# Patient Record
Sex: Male | Born: 1955
Health system: Southern US, Community
[De-identification: ages and names within clinical notes are randomized; demographics above are authoritative.]

## PROBLEM LIST (undated history)

## (undated) DIAGNOSIS — K219 Gastro-esophageal reflux disease without esophagitis: Secondary | ICD-10-CM

## (undated) DIAGNOSIS — N181 Chronic kidney disease, stage 1: Secondary | ICD-10-CM

## (undated) DIAGNOSIS — Z8673 Personal history of transient ischemic attack (TIA), and cerebral infarction without residual deficits: Secondary | ICD-10-CM

## (undated) DIAGNOSIS — I129 Hypertensive chronic kidney disease with stage 1 through stage 4 chronic kidney disease, or unspecified chronic kidney disease: Secondary | ICD-10-CM

## (undated) DIAGNOSIS — Z8619 Personal history of other infectious and parasitic diseases: Secondary | ICD-10-CM

## (undated) DIAGNOSIS — M1990 Unspecified osteoarthritis, unspecified site: Secondary | ICD-10-CM

## (undated) DIAGNOSIS — E669 Obesity, unspecified: Secondary | ICD-10-CM

## (undated) DIAGNOSIS — I10 Essential (primary) hypertension: Secondary | ICD-10-CM

## (undated) DIAGNOSIS — E785 Hyperlipidemia, unspecified: Secondary | ICD-10-CM

## (undated) DIAGNOSIS — R079 Chest pain, unspecified: Secondary | ICD-10-CM

## (undated) DIAGNOSIS — N1831 Chronic kidney disease, stage 3a: Secondary | ICD-10-CM

## (undated) DIAGNOSIS — M5416 Radiculopathy, lumbar region: Secondary | ICD-10-CM

## (undated) DIAGNOSIS — R7301 Impaired fasting glucose: Secondary | ICD-10-CM

## (undated) HISTORY — DX: Radiculopathy, lumbar region: M54.16

## (undated) HISTORY — DX: Essential (primary) hypertension: I10

## (undated) HISTORY — DX: Hyperlipidemia, unspecified: E78.5

## (undated) HISTORY — DX: Chronic kidney disease, stage 1: N18.1

## (undated) HISTORY — DX: Personal history of transient ischemic attack (TIA), and cerebral infarction without residual deficits: Z86.73

## (undated) HISTORY — DX: Obesity, unspecified: E66.9

## (undated) HISTORY — DX: Unspecified osteoarthritis, unspecified site: M19.90

## (undated) HISTORY — DX: Personal history of other infectious and parasitic diseases: Z86.19

## (undated) HISTORY — DX: Chest pain, unspecified: R07.9

## (undated) HISTORY — DX: Gastro-esophageal reflux disease without esophagitis: K21.9

## (undated) HISTORY — DX: Chronic kidney disease, stage 3a: N18.31

## (undated) HISTORY — DX: Hypertensive chronic kidney disease with stage 1 through stage 4 chronic kidney disease, or unspecified chronic kidney disease: I12.9

## (undated) HISTORY — DX: Impaired fasting glucose: R73.01

---

## 1998-08-13 HISTORY — PX: MOUTH SURGERY: SHX715

## 2018-10-02 DIAGNOSIS — E7849 Other hyperlipidemia: Secondary | ICD-10-CM | POA: Diagnosis not present

## 2018-10-02 DIAGNOSIS — I10 Essential (primary) hypertension: Secondary | ICD-10-CM | POA: Diagnosis not present

## 2018-10-02 DIAGNOSIS — Z125 Encounter for screening for malignant neoplasm of prostate: Secondary | ICD-10-CM | POA: Diagnosis not present

## 2018-10-10 DIAGNOSIS — I10 Essential (primary) hypertension: Secondary | ICD-10-CM | POA: Diagnosis not present

## 2018-10-10 DIAGNOSIS — E7849 Other hyperlipidemia: Secondary | ICD-10-CM | POA: Diagnosis not present

## 2018-10-10 DIAGNOSIS — Z0000 Encounter for general adult medical examination without abnormal findings: Secondary | ICD-10-CM | POA: Diagnosis not present

## 2018-10-10 DIAGNOSIS — Z6839 Body mass index (BMI) 39.0-39.9, adult: Secondary | ICD-10-CM | POA: Diagnosis not present

## 2018-10-10 DIAGNOSIS — E669 Obesity, unspecified: Secondary | ICD-10-CM | POA: Diagnosis not present

## 2018-10-10 DIAGNOSIS — M5416 Radiculopathy, lumbar region: Secondary | ICD-10-CM | POA: Diagnosis not present

## 2018-10-13 DIAGNOSIS — Z1212 Encounter for screening for malignant neoplasm of rectum: Secondary | ICD-10-CM | POA: Diagnosis not present

## 2018-10-20 DIAGNOSIS — M545 Low back pain: Secondary | ICD-10-CM | POA: Diagnosis not present

## 2018-10-20 DIAGNOSIS — M5417 Radiculopathy, lumbosacral region: Secondary | ICD-10-CM | POA: Diagnosis not present

## 2018-10-28 ENCOUNTER — Other Ambulatory Visit: Payer: Self-pay

## 2018-10-28 ENCOUNTER — Ambulatory Visit: Payer: Medicare Other | Attending: Orthopedic Surgery | Admitting: Physical Therapy

## 2018-10-28 DIAGNOSIS — M5416 Radiculopathy, lumbar region: Secondary | ICD-10-CM | POA: Diagnosis not present

## 2018-10-28 NOTE — Patient Instructions (Signed)
On Elbows (Prone)    Rise up on elbows as high as possible, keeping hips on floor. Hold __30-60__ seconds. Repeat _3-5___ times per set. Do ____ sets per session. Do multiple sessions per day.   IF THE NUMBNESS AND PAIN GETS WORSE WITH THIS. STOP. IF THE NUMBNESS AND PAIN IN THE LEG FEEL BETTER, BUT YOU'RE BACK HURTS A LITTLE WORSE, THAT IS OKAY.  THE GOAL IS TO GET ALL LEG SYMPTOMS TO GO AWAY.  http://orth.exer.us/92   Copyright  VHI. All rights reserved.

## 2018-10-28 NOTE — Therapy (Signed)
[facility] [place] [address] [place], [place], [address] Phone: [phone]   Fax:  [phone]  Physical Therapy Evaluation  Patient Details  Name: Joseph Compton MRN: [id number] Date of Birth: May 21, 1956 Referring Provider (PT): Lacie Draft PAC/Beane   Encounter Date: 10/28/2018  PT End of Session - 10/28/18 9767    Visit Number  1    Number of Visits  12    Date for PT Re-Evaluation  11/25/18    PT Start Time  0922    PT Stop Time  1021    PT Time Calculation (min)  59 min    Activity Tolerance  Patient limited by pain    Behavior During Therapy  [facility] for tasks assessed/performed       Past Medical History:  Diagnosis Date  . Arthritis   . Hypertension     History reviewed. No pertinent surgical history.  There were no vitals filed for this visit.   Subjective Assessment - 10/28/18 0923    Subjective  In December of 2019 pt began getting some pain and numbness in his left low back and leg. 2 weeks ago Sunday he awoke and couldn't move with pain in same. When he gets up out of bed he feels pain and numbness. Walking around makes him feel better.     Pertinent History  HTN, neck pain, arthritis, right knee pain    Diagnostic tests  MRI 10/30/18; xrays some curvature    Currently in Pain?  Yes    Pain Score  5     Pain Location  Back    Pain Orientation  Left    Pain Descriptors / Indicators  Shooting;Numbness    Pain Type  Acute pain    Pain Radiating Towards  down leg to mid foot    Pain Onset  More than a month ago    Pain Frequency  Constant    Aggravating Factors   awaking from sleeping    Pain Relieving Factors  walking         [facility] PT Assessment - 10/28/18 0001      Assessment   Medical Diagnosis  lumbar radiculities    Referring Provider (PT)  Lacie Draft PAC/Beane    Onset Date/Surgical Date  07/13/18    Next MD Visit  11/03/18      Precautions   Precautions  None      Restrictions    Weight Bearing Restrictions  No      Balance Screen   Has the patient fallen in the past 6 months  No    Has the patient had a decrease in activity level because of a fear of falling?   No    Is the patient reluctant to leave their home because of a fear of falling?   No      Home Environment   Living Environment  Private residence    Living Arrangements  Spouse/significant other    Available Help at Discharge  Family    Additional Comments  one step no rail      Prior Function   Level of [place]  Retired      Mining engineer Comments  stands in left SB and slight flexion; rounded shoulders, forward head      ROM / Strength   AROM / PROM / Strength  AROM;Strength      AROM   Overall AROM  Comments  Lumbar bend hands to knees, no extension, 75% decrease in SB with pain in all motions      Strength   Overall Strength Comments  Bil hip flex 4-/5, ext 4/5, ABD unable to test, knee ext 4/5, flex bil 4+/5; left ankle grossly 4-/5, but unsure if patient able to fully perform MMT due to pain      Palpation   Palpation comment  left gluteals marked tenderness      Special Tests   Other special tests  positive SLR and slump test on Left; assessed positioning prone over 2 pillows, POE, press ups. POE less pain; numbnes persists                Objective measurements completed on examination: See above findings.      [facility] Adult PT Treatment/Exercise - 10/28/18 0001      Modalities   Modalities  Electrical Stimulation;Moist Heat      Moist Heat Therapy   Number Minutes Moist Heat  15 Minutes    Moist Heat Location  Lumbar Spine      Electrical Stimulation   Electrical Stimulation Location  left lumbar/gluteals    Electrical Stimulation Action  IFC    Electrical Stimulation Parameters  prone    Electrical Stimulation Goals  Pain               PT Short Term Goals - 10/28/18 1208      PT SHORT TERM GOAL #1    Title  Ind with initial HEP    Time  2    Period  Weeks    Status  New    Target Date  11/11/18      PT SHORT TERM GOAL #2   Title  Patient to report decreased pain by 50% overall to improve function.    Time  2    Period  Weeks    Status  New        PT Long Term Goals - 10/28/18 1209      PT LONG TERM GOAL #1   Title  Patient to report no radicular sx in the LLE to normalize gait    Time  4    Status  New    Target Date  11/25/18      PT LONG TERM GOAL #2   Title  Decreased pain with ADLS to 4/17 or less in the back.    Time  4    Period  Weeks    Status  New      PT LONG TERM GOAL #3   Title  Demo BLE strength of 4+/5  in order to utilize proper body mechanics.    Time  4    Period  Weeks    Status  New      PT LONG TERM GOAL #4   Title  Patient able to demonstrate correct body mechanics with lifting and verbalize ADLS modifications to prevent further injury to back.    Time  4    Period  Weeks    Status  New             Plan - 10/28/18 1009    Clinical Impression Statement  Patient presents with c/o lumbar radiculopathy starting in Dec 2019 and worsening in early March after lifting and moving boxes. He has pain and numbness in his left low back, hip and down his LLE to mid foot. He has pain with standing and walking after sitting  and especially after sleeping. MRI scheduled for 10/30/18. He stands with decreased WB on LLE and hip in ER. He also laterally SB to the left. He has significant strength deficits, but it is unclear whether this is truly due to weakness or due to amount of pain pt was in during evaluation. He will benefit from PT to decrease pain, restore lumbar ROM and increase strength in order to return to pain free ADLS.    Personal Factors and Comorbidities  Comorbidity 3+    Comorbidities  arthritis, HTN, neck pain, right knee pain    Examination-Activity Limitations  Lift;Bend;Locomotion Level;Stand    Examination-Participation Restrictions   Other   all ADLS limited at this time   Stability/Clinical Decision Making  Evolving/Moderate complexity    Clinical Decision Making  Moderate    Rehab Potential  Good    PT Frequency  3x / week    PT Duration  4 weeks    PT Treatment/Interventions  ADLs/Self Care Home Management;Cryotherapy;Electrical Stimulation;Moist Heat;Traction;Ultrasound;Therapeutic activities;Therapeutic exercise;Neuromuscular re-education;Patient/family education;Manual techniques;Dry needling    PT Next Visit Plan  work on centralizing pain; correct postural deviation; try traction    PT Home Exercise Plan  prone on elbows as tolerated to try to centralize sx    Consulted and Agree with Plan of Care  Patient       Patient will benefit from skilled therapeutic intervention in order to improve the following deficits and impairments:  Abnormal gait, Pain, Improper body mechanics, Decreased mobility, Increased muscle spasms, Postural dysfunction, Decreased range of motion, Decreased strength  Visit Diagnosis: Radiculopathy, lumbar region - Plan: PT plan of care cert/re-cert     Problem List There are no active problems to display for this patient.   Madelyn Flavors PT 10/28/2018, 12:19 PM  [address] [place], [place], [address] Phone: [phone]   Fax:  [phone]  Name: Harden Bramer MRN: [id number] Date of Birth: 1956-06-22

## 2018-11-05 ENCOUNTER — Encounter: Payer: Medicare Other | Admitting: Physical Therapy

## 2018-11-06 DIAGNOSIS — M545 Low back pain: Secondary | ICD-10-CM | POA: Diagnosis not present

## 2018-11-06 DIAGNOSIS — M5116 Intervertebral disc disorders with radiculopathy, lumbar region: Secondary | ICD-10-CM | POA: Insufficient documentation

## 2018-11-07 ENCOUNTER — Encounter: Payer: Medicare Other | Admitting: Physical Therapy

## 2018-11-17 ENCOUNTER — Ambulatory Visit: Payer: Medicare Other | Admitting: Physical Therapy

## 2018-11-17 DIAGNOSIS — M5136 Other intervertebral disc degeneration, lumbar region: Secondary | ICD-10-CM | POA: Diagnosis not present

## 2018-11-17 DIAGNOSIS — E882 Lipomatosis, not elsewhere classified: Secondary | ICD-10-CM | POA: Diagnosis not present

## 2018-11-17 DIAGNOSIS — D1779 Benign lipomatous neoplasm of other sites: Secondary | ICD-10-CM | POA: Insufficient documentation

## 2018-11-17 DIAGNOSIS — M5126 Other intervertebral disc displacement, lumbar region: Secondary | ICD-10-CM | POA: Diagnosis not present

## 2018-11-18 DIAGNOSIS — M5416 Radiculopathy, lumbar region: Secondary | ICD-10-CM | POA: Diagnosis not present

## 2018-11-18 DIAGNOSIS — M5126 Other intervertebral disc displacement, lumbar region: Secondary | ICD-10-CM | POA: Diagnosis not present

## 2018-11-18 DIAGNOSIS — M5136 Other intervertebral disc degeneration, lumbar region: Secondary | ICD-10-CM | POA: Diagnosis not present

## 2018-12-02 DIAGNOSIS — M5136 Other intervertebral disc degeneration, lumbar region: Secondary | ICD-10-CM | POA: Diagnosis not present

## 2018-12-02 DIAGNOSIS — M9903 Segmental and somatic dysfunction of lumbar region: Secondary | ICD-10-CM | POA: Diagnosis not present

## 2018-12-02 DIAGNOSIS — M9904 Segmental and somatic dysfunction of sacral region: Secondary | ICD-10-CM | POA: Diagnosis not present

## 2018-12-02 DIAGNOSIS — M9905 Segmental and somatic dysfunction of pelvic region: Secondary | ICD-10-CM | POA: Diagnosis not present

## 2018-12-03 DIAGNOSIS — M9903 Segmental and somatic dysfunction of lumbar region: Secondary | ICD-10-CM | POA: Diagnosis not present

## 2018-12-03 DIAGNOSIS — M5126 Other intervertebral disc displacement, lumbar region: Secondary | ICD-10-CM | POA: Diagnosis not present

## 2018-12-03 DIAGNOSIS — M5430 Sciatica, unspecified side: Secondary | ICD-10-CM | POA: Diagnosis not present

## 2018-12-03 DIAGNOSIS — M5136 Other intervertebral disc degeneration, lumbar region: Secondary | ICD-10-CM | POA: Diagnosis not present

## 2018-12-03 DIAGNOSIS — M9904 Segmental and somatic dysfunction of sacral region: Secondary | ICD-10-CM | POA: Diagnosis not present

## 2018-12-03 DIAGNOSIS — M9905 Segmental and somatic dysfunction of pelvic region: Secondary | ICD-10-CM | POA: Diagnosis not present

## 2018-12-03 DIAGNOSIS — E882 Lipomatosis, not elsewhere classified: Secondary | ICD-10-CM | POA: Diagnosis not present

## 2018-12-04 DIAGNOSIS — M9905 Segmental and somatic dysfunction of pelvic region: Secondary | ICD-10-CM | POA: Diagnosis not present

## 2018-12-04 DIAGNOSIS — M9904 Segmental and somatic dysfunction of sacral region: Secondary | ICD-10-CM | POA: Diagnosis not present

## 2018-12-04 DIAGNOSIS — M5126 Other intervertebral disc displacement, lumbar region: Secondary | ICD-10-CM | POA: Diagnosis not present

## 2018-12-04 DIAGNOSIS — M5136 Other intervertebral disc degeneration, lumbar region: Secondary | ICD-10-CM | POA: Diagnosis not present

## 2018-12-04 DIAGNOSIS — M9903 Segmental and somatic dysfunction of lumbar region: Secondary | ICD-10-CM | POA: Diagnosis not present

## 2018-12-08 DIAGNOSIS — M5136 Other intervertebral disc degeneration, lumbar region: Secondary | ICD-10-CM | POA: Diagnosis not present

## 2018-12-08 DIAGNOSIS — M9903 Segmental and somatic dysfunction of lumbar region: Secondary | ICD-10-CM | POA: Diagnosis not present

## 2018-12-08 DIAGNOSIS — M9905 Segmental and somatic dysfunction of pelvic region: Secondary | ICD-10-CM | POA: Diagnosis not present

## 2018-12-08 DIAGNOSIS — M9904 Segmental and somatic dysfunction of sacral region: Secondary | ICD-10-CM | POA: Diagnosis not present

## 2018-12-10 DIAGNOSIS — M9904 Segmental and somatic dysfunction of sacral region: Secondary | ICD-10-CM | POA: Diagnosis not present

## 2018-12-10 DIAGNOSIS — M9903 Segmental and somatic dysfunction of lumbar region: Secondary | ICD-10-CM | POA: Diagnosis not present

## 2018-12-10 DIAGNOSIS — M9905 Segmental and somatic dysfunction of pelvic region: Secondary | ICD-10-CM | POA: Diagnosis not present

## 2018-12-10 DIAGNOSIS — M5136 Other intervertebral disc degeneration, lumbar region: Secondary | ICD-10-CM | POA: Diagnosis not present

## 2018-12-15 DIAGNOSIS — M5136 Other intervertebral disc degeneration, lumbar region: Secondary | ICD-10-CM | POA: Diagnosis not present

## 2018-12-15 DIAGNOSIS — M9905 Segmental and somatic dysfunction of pelvic region: Secondary | ICD-10-CM | POA: Diagnosis not present

## 2018-12-15 DIAGNOSIS — M9903 Segmental and somatic dysfunction of lumbar region: Secondary | ICD-10-CM | POA: Diagnosis not present

## 2018-12-15 DIAGNOSIS — M9904 Segmental and somatic dysfunction of sacral region: Secondary | ICD-10-CM | POA: Diagnosis not present

## 2018-12-16 DIAGNOSIS — M9904 Segmental and somatic dysfunction of sacral region: Secondary | ICD-10-CM | POA: Diagnosis not present

## 2018-12-16 DIAGNOSIS — M9905 Segmental and somatic dysfunction of pelvic region: Secondary | ICD-10-CM | POA: Diagnosis not present

## 2018-12-16 DIAGNOSIS — M5136 Other intervertebral disc degeneration, lumbar region: Secondary | ICD-10-CM | POA: Diagnosis not present

## 2018-12-16 DIAGNOSIS — M9903 Segmental and somatic dysfunction of lumbar region: Secondary | ICD-10-CM | POA: Diagnosis not present

## 2018-12-18 DIAGNOSIS — M9903 Segmental and somatic dysfunction of lumbar region: Secondary | ICD-10-CM | POA: Diagnosis not present

## 2018-12-18 DIAGNOSIS — M9905 Segmental and somatic dysfunction of pelvic region: Secondary | ICD-10-CM | POA: Diagnosis not present

## 2018-12-18 DIAGNOSIS — M5136 Other intervertebral disc degeneration, lumbar region: Secondary | ICD-10-CM | POA: Diagnosis not present

## 2018-12-18 DIAGNOSIS — M9904 Segmental and somatic dysfunction of sacral region: Secondary | ICD-10-CM | POA: Diagnosis not present

## 2018-12-22 DIAGNOSIS — M5136 Other intervertebral disc degeneration, lumbar region: Secondary | ICD-10-CM | POA: Diagnosis not present

## 2018-12-22 DIAGNOSIS — M9905 Segmental and somatic dysfunction of pelvic region: Secondary | ICD-10-CM | POA: Diagnosis not present

## 2018-12-22 DIAGNOSIS — M9904 Segmental and somatic dysfunction of sacral region: Secondary | ICD-10-CM | POA: Diagnosis not present

## 2018-12-22 DIAGNOSIS — M9903 Segmental and somatic dysfunction of lumbar region: Secondary | ICD-10-CM | POA: Diagnosis not present

## 2018-12-25 DIAGNOSIS — M5136 Other intervertebral disc degeneration, lumbar region: Secondary | ICD-10-CM | POA: Diagnosis not present

## 2018-12-25 DIAGNOSIS — M9904 Segmental and somatic dysfunction of sacral region: Secondary | ICD-10-CM | POA: Diagnosis not present

## 2018-12-25 DIAGNOSIS — M9905 Segmental and somatic dysfunction of pelvic region: Secondary | ICD-10-CM | POA: Diagnosis not present

## 2018-12-25 DIAGNOSIS — M9903 Segmental and somatic dysfunction of lumbar region: Secondary | ICD-10-CM | POA: Diagnosis not present

## 2018-12-30 DIAGNOSIS — M9904 Segmental and somatic dysfunction of sacral region: Secondary | ICD-10-CM | POA: Diagnosis not present

## 2018-12-30 DIAGNOSIS — M9905 Segmental and somatic dysfunction of pelvic region: Secondary | ICD-10-CM | POA: Diagnosis not present

## 2018-12-30 DIAGNOSIS — M5136 Other intervertebral disc degeneration, lumbar region: Secondary | ICD-10-CM | POA: Diagnosis not present

## 2018-12-30 DIAGNOSIS — M9903 Segmental and somatic dysfunction of lumbar region: Secondary | ICD-10-CM | POA: Diagnosis not present

## 2019-01-06 DIAGNOSIS — M9905 Segmental and somatic dysfunction of pelvic region: Secondary | ICD-10-CM | POA: Diagnosis not present

## 2019-01-06 DIAGNOSIS — M5136 Other intervertebral disc degeneration, lumbar region: Secondary | ICD-10-CM | POA: Diagnosis not present

## 2019-01-06 DIAGNOSIS — M9904 Segmental and somatic dysfunction of sacral region: Secondary | ICD-10-CM | POA: Diagnosis not present

## 2019-01-06 DIAGNOSIS — M9903 Segmental and somatic dysfunction of lumbar region: Secondary | ICD-10-CM | POA: Diagnosis not present

## 2019-01-08 DIAGNOSIS — M9904 Segmental and somatic dysfunction of sacral region: Secondary | ICD-10-CM | POA: Diagnosis not present

## 2019-01-08 DIAGNOSIS — M9905 Segmental and somatic dysfunction of pelvic region: Secondary | ICD-10-CM | POA: Diagnosis not present

## 2019-01-08 DIAGNOSIS — M5136 Other intervertebral disc degeneration, lumbar region: Secondary | ICD-10-CM | POA: Diagnosis not present

## 2019-01-08 DIAGNOSIS — M9903 Segmental and somatic dysfunction of lumbar region: Secondary | ICD-10-CM | POA: Diagnosis not present

## 2019-01-12 DIAGNOSIS — M9905 Segmental and somatic dysfunction of pelvic region: Secondary | ICD-10-CM | POA: Diagnosis not present

## 2019-01-12 DIAGNOSIS — M9903 Segmental and somatic dysfunction of lumbar region: Secondary | ICD-10-CM | POA: Diagnosis not present

## 2019-01-12 DIAGNOSIS — M5136 Other intervertebral disc degeneration, lumbar region: Secondary | ICD-10-CM | POA: Diagnosis not present

## 2019-01-12 DIAGNOSIS — M9904 Segmental and somatic dysfunction of sacral region: Secondary | ICD-10-CM | POA: Diagnosis not present

## 2019-01-14 DIAGNOSIS — M9905 Segmental and somatic dysfunction of pelvic region: Secondary | ICD-10-CM | POA: Diagnosis not present

## 2019-01-14 DIAGNOSIS — M9903 Segmental and somatic dysfunction of lumbar region: Secondary | ICD-10-CM | POA: Diagnosis not present

## 2019-01-14 DIAGNOSIS — M5136 Other intervertebral disc degeneration, lumbar region: Secondary | ICD-10-CM | POA: Diagnosis not present

## 2019-01-14 DIAGNOSIS — M9904 Segmental and somatic dysfunction of sacral region: Secondary | ICD-10-CM | POA: Diagnosis not present

## 2019-01-15 DIAGNOSIS — M9903 Segmental and somatic dysfunction of lumbar region: Secondary | ICD-10-CM | POA: Diagnosis not present

## 2019-01-15 DIAGNOSIS — M5136 Other intervertebral disc degeneration, lumbar region: Secondary | ICD-10-CM | POA: Diagnosis not present

## 2019-01-15 DIAGNOSIS — M9904 Segmental and somatic dysfunction of sacral region: Secondary | ICD-10-CM | POA: Diagnosis not present

## 2019-01-15 DIAGNOSIS — H16223 Keratoconjunctivitis sicca, not specified as Sjogren's, bilateral: Secondary | ICD-10-CM | POA: Diagnosis not present

## 2019-01-15 DIAGNOSIS — M9905 Segmental and somatic dysfunction of pelvic region: Secondary | ICD-10-CM | POA: Diagnosis not present

## 2019-01-19 DIAGNOSIS — M9903 Segmental and somatic dysfunction of lumbar region: Secondary | ICD-10-CM | POA: Diagnosis not present

## 2019-01-19 DIAGNOSIS — M5136 Other intervertebral disc degeneration, lumbar region: Secondary | ICD-10-CM | POA: Diagnosis not present

## 2019-01-19 DIAGNOSIS — M9904 Segmental and somatic dysfunction of sacral region: Secondary | ICD-10-CM | POA: Diagnosis not present

## 2019-01-19 DIAGNOSIS — M9905 Segmental and somatic dysfunction of pelvic region: Secondary | ICD-10-CM | POA: Diagnosis not present

## 2019-01-21 DIAGNOSIS — M9904 Segmental and somatic dysfunction of sacral region: Secondary | ICD-10-CM | POA: Diagnosis not present

## 2019-01-21 DIAGNOSIS — M9905 Segmental and somatic dysfunction of pelvic region: Secondary | ICD-10-CM | POA: Diagnosis not present

## 2019-01-21 DIAGNOSIS — M9903 Segmental and somatic dysfunction of lumbar region: Secondary | ICD-10-CM | POA: Diagnosis not present

## 2019-01-21 DIAGNOSIS — M5136 Other intervertebral disc degeneration, lumbar region: Secondary | ICD-10-CM | POA: Diagnosis not present

## 2019-01-26 DIAGNOSIS — M5136 Other intervertebral disc degeneration, lumbar region: Secondary | ICD-10-CM | POA: Diagnosis not present

## 2019-01-26 DIAGNOSIS — M9904 Segmental and somatic dysfunction of sacral region: Secondary | ICD-10-CM | POA: Diagnosis not present

## 2019-01-26 DIAGNOSIS — M9905 Segmental and somatic dysfunction of pelvic region: Secondary | ICD-10-CM | POA: Diagnosis not present

## 2019-01-26 DIAGNOSIS — M9903 Segmental and somatic dysfunction of lumbar region: Secondary | ICD-10-CM | POA: Diagnosis not present

## 2019-01-28 DIAGNOSIS — M9904 Segmental and somatic dysfunction of sacral region: Secondary | ICD-10-CM | POA: Diagnosis not present

## 2019-01-28 DIAGNOSIS — M9905 Segmental and somatic dysfunction of pelvic region: Secondary | ICD-10-CM | POA: Diagnosis not present

## 2019-01-28 DIAGNOSIS — M5136 Other intervertebral disc degeneration, lumbar region: Secondary | ICD-10-CM | POA: Diagnosis not present

## 2019-01-28 DIAGNOSIS — M9903 Segmental and somatic dysfunction of lumbar region: Secondary | ICD-10-CM | POA: Diagnosis not present

## 2019-01-29 DIAGNOSIS — M5136 Other intervertebral disc degeneration, lumbar region: Secondary | ICD-10-CM | POA: Diagnosis not present

## 2019-01-29 DIAGNOSIS — M9903 Segmental and somatic dysfunction of lumbar region: Secondary | ICD-10-CM | POA: Diagnosis not present

## 2019-01-29 DIAGNOSIS — M9904 Segmental and somatic dysfunction of sacral region: Secondary | ICD-10-CM | POA: Diagnosis not present

## 2019-01-29 DIAGNOSIS — M9905 Segmental and somatic dysfunction of pelvic region: Secondary | ICD-10-CM | POA: Diagnosis not present

## 2019-02-02 DIAGNOSIS — M9903 Segmental and somatic dysfunction of lumbar region: Secondary | ICD-10-CM | POA: Diagnosis not present

## 2019-02-02 DIAGNOSIS — M9905 Segmental and somatic dysfunction of pelvic region: Secondary | ICD-10-CM | POA: Diagnosis not present

## 2019-02-02 DIAGNOSIS — M9904 Segmental and somatic dysfunction of sacral region: Secondary | ICD-10-CM | POA: Diagnosis not present

## 2019-02-02 DIAGNOSIS — M5136 Other intervertebral disc degeneration, lumbar region: Secondary | ICD-10-CM | POA: Diagnosis not present

## 2019-02-03 DIAGNOSIS — M5136 Other intervertebral disc degeneration, lumbar region: Secondary | ICD-10-CM | POA: Diagnosis not present

## 2019-02-03 DIAGNOSIS — M9903 Segmental and somatic dysfunction of lumbar region: Secondary | ICD-10-CM | POA: Diagnosis not present

## 2019-02-03 DIAGNOSIS — M9904 Segmental and somatic dysfunction of sacral region: Secondary | ICD-10-CM | POA: Diagnosis not present

## 2019-02-03 DIAGNOSIS — M9905 Segmental and somatic dysfunction of pelvic region: Secondary | ICD-10-CM | POA: Diagnosis not present

## 2019-02-05 DIAGNOSIS — M9904 Segmental and somatic dysfunction of sacral region: Secondary | ICD-10-CM | POA: Diagnosis not present

## 2019-02-05 DIAGNOSIS — M9903 Segmental and somatic dysfunction of lumbar region: Secondary | ICD-10-CM | POA: Diagnosis not present

## 2019-02-05 DIAGNOSIS — M5136 Other intervertebral disc degeneration, lumbar region: Secondary | ICD-10-CM | POA: Diagnosis not present

## 2019-02-05 DIAGNOSIS — M9905 Segmental and somatic dysfunction of pelvic region: Secondary | ICD-10-CM | POA: Diagnosis not present

## 2019-02-09 DIAGNOSIS — M5136 Other intervertebral disc degeneration, lumbar region: Secondary | ICD-10-CM | POA: Diagnosis not present

## 2019-02-09 DIAGNOSIS — M9903 Segmental and somatic dysfunction of lumbar region: Secondary | ICD-10-CM | POA: Diagnosis not present

## 2019-02-09 DIAGNOSIS — M9904 Segmental and somatic dysfunction of sacral region: Secondary | ICD-10-CM | POA: Diagnosis not present

## 2019-02-09 DIAGNOSIS — M9905 Segmental and somatic dysfunction of pelvic region: Secondary | ICD-10-CM | POA: Diagnosis not present

## 2019-02-10 DIAGNOSIS — M9904 Segmental and somatic dysfunction of sacral region: Secondary | ICD-10-CM | POA: Diagnosis not present

## 2019-02-10 DIAGNOSIS — M5136 Other intervertebral disc degeneration, lumbar region: Secondary | ICD-10-CM | POA: Diagnosis not present

## 2019-02-10 DIAGNOSIS — M9903 Segmental and somatic dysfunction of lumbar region: Secondary | ICD-10-CM | POA: Diagnosis not present

## 2019-02-10 DIAGNOSIS — M9905 Segmental and somatic dysfunction of pelvic region: Secondary | ICD-10-CM | POA: Diagnosis not present

## 2019-02-12 DIAGNOSIS — M9905 Segmental and somatic dysfunction of pelvic region: Secondary | ICD-10-CM | POA: Diagnosis not present

## 2019-02-12 DIAGNOSIS — M5136 Other intervertebral disc degeneration, lumbar region: Secondary | ICD-10-CM | POA: Diagnosis not present

## 2019-02-12 DIAGNOSIS — M9903 Segmental and somatic dysfunction of lumbar region: Secondary | ICD-10-CM | POA: Diagnosis not present

## 2019-02-12 DIAGNOSIS — M9904 Segmental and somatic dysfunction of sacral region: Secondary | ICD-10-CM | POA: Diagnosis not present

## 2019-02-16 DIAGNOSIS — M9904 Segmental and somatic dysfunction of sacral region: Secondary | ICD-10-CM | POA: Diagnosis not present

## 2019-02-16 DIAGNOSIS — M9903 Segmental and somatic dysfunction of lumbar region: Secondary | ICD-10-CM | POA: Diagnosis not present

## 2019-02-16 DIAGNOSIS — M5136 Other intervertebral disc degeneration, lumbar region: Secondary | ICD-10-CM | POA: Diagnosis not present

## 2019-02-16 DIAGNOSIS — M9905 Segmental and somatic dysfunction of pelvic region: Secondary | ICD-10-CM | POA: Diagnosis not present

## 2019-02-19 DIAGNOSIS — M5136 Other intervertebral disc degeneration, lumbar region: Secondary | ICD-10-CM | POA: Diagnosis not present

## 2019-02-19 DIAGNOSIS — M9905 Segmental and somatic dysfunction of pelvic region: Secondary | ICD-10-CM | POA: Diagnosis not present

## 2019-02-19 DIAGNOSIS — M9903 Segmental and somatic dysfunction of lumbar region: Secondary | ICD-10-CM | POA: Diagnosis not present

## 2019-02-19 DIAGNOSIS — M9904 Segmental and somatic dysfunction of sacral region: Secondary | ICD-10-CM | POA: Diagnosis not present

## 2019-02-23 DIAGNOSIS — M9903 Segmental and somatic dysfunction of lumbar region: Secondary | ICD-10-CM | POA: Diagnosis not present

## 2019-02-23 DIAGNOSIS — M9904 Segmental and somatic dysfunction of sacral region: Secondary | ICD-10-CM | POA: Diagnosis not present

## 2019-02-23 DIAGNOSIS — M5136 Other intervertebral disc degeneration, lumbar region: Secondary | ICD-10-CM | POA: Diagnosis not present

## 2019-02-23 DIAGNOSIS — M9905 Segmental and somatic dysfunction of pelvic region: Secondary | ICD-10-CM | POA: Diagnosis not present

## 2019-02-26 DIAGNOSIS — M5136 Other intervertebral disc degeneration, lumbar region: Secondary | ICD-10-CM | POA: Diagnosis not present

## 2019-02-26 DIAGNOSIS — M9904 Segmental and somatic dysfunction of sacral region: Secondary | ICD-10-CM | POA: Diagnosis not present

## 2019-02-26 DIAGNOSIS — M9905 Segmental and somatic dysfunction of pelvic region: Secondary | ICD-10-CM | POA: Diagnosis not present

## 2019-02-26 DIAGNOSIS — M9903 Segmental and somatic dysfunction of lumbar region: Secondary | ICD-10-CM | POA: Diagnosis not present

## 2019-03-02 DIAGNOSIS — M9904 Segmental and somatic dysfunction of sacral region: Secondary | ICD-10-CM | POA: Diagnosis not present

## 2019-03-02 DIAGNOSIS — M9905 Segmental and somatic dysfunction of pelvic region: Secondary | ICD-10-CM | POA: Diagnosis not present

## 2019-03-02 DIAGNOSIS — M9903 Segmental and somatic dysfunction of lumbar region: Secondary | ICD-10-CM | POA: Diagnosis not present

## 2019-03-02 DIAGNOSIS — M5136 Other intervertebral disc degeneration, lumbar region: Secondary | ICD-10-CM | POA: Diagnosis not present

## 2019-03-03 DIAGNOSIS — M9905 Segmental and somatic dysfunction of pelvic region: Secondary | ICD-10-CM | POA: Diagnosis not present

## 2019-03-03 DIAGNOSIS — M9903 Segmental and somatic dysfunction of lumbar region: Secondary | ICD-10-CM | POA: Diagnosis not present

## 2019-03-03 DIAGNOSIS — M9904 Segmental and somatic dysfunction of sacral region: Secondary | ICD-10-CM | POA: Diagnosis not present

## 2019-03-03 DIAGNOSIS — M5136 Other intervertebral disc degeneration, lumbar region: Secondary | ICD-10-CM | POA: Diagnosis not present

## 2019-03-05 DIAGNOSIS — M9903 Segmental and somatic dysfunction of lumbar region: Secondary | ICD-10-CM | POA: Diagnosis not present

## 2019-03-05 DIAGNOSIS — M9905 Segmental and somatic dysfunction of pelvic region: Secondary | ICD-10-CM | POA: Diagnosis not present

## 2019-03-05 DIAGNOSIS — M9904 Segmental and somatic dysfunction of sacral region: Secondary | ICD-10-CM | POA: Diagnosis not present

## 2019-03-05 DIAGNOSIS — M5136 Other intervertebral disc degeneration, lumbar region: Secondary | ICD-10-CM | POA: Diagnosis not present

## 2019-03-09 DIAGNOSIS — M9905 Segmental and somatic dysfunction of pelvic region: Secondary | ICD-10-CM | POA: Diagnosis not present

## 2019-03-09 DIAGNOSIS — M5136 Other intervertebral disc degeneration, lumbar region: Secondary | ICD-10-CM | POA: Diagnosis not present

## 2019-03-09 DIAGNOSIS — M9903 Segmental and somatic dysfunction of lumbar region: Secondary | ICD-10-CM | POA: Diagnosis not present

## 2019-03-09 DIAGNOSIS — M9904 Segmental and somatic dysfunction of sacral region: Secondary | ICD-10-CM | POA: Diagnosis not present

## 2019-03-11 DIAGNOSIS — M9904 Segmental and somatic dysfunction of sacral region: Secondary | ICD-10-CM | POA: Diagnosis not present

## 2019-03-11 DIAGNOSIS — M9903 Segmental and somatic dysfunction of lumbar region: Secondary | ICD-10-CM | POA: Diagnosis not present

## 2019-03-11 DIAGNOSIS — M5136 Other intervertebral disc degeneration, lumbar region: Secondary | ICD-10-CM | POA: Diagnosis not present

## 2019-03-11 DIAGNOSIS — M9905 Segmental and somatic dysfunction of pelvic region: Secondary | ICD-10-CM | POA: Diagnosis not present

## 2019-03-12 DIAGNOSIS — M5136 Other intervertebral disc degeneration, lumbar region: Secondary | ICD-10-CM | POA: Diagnosis not present

## 2019-03-12 DIAGNOSIS — M9903 Segmental and somatic dysfunction of lumbar region: Secondary | ICD-10-CM | POA: Diagnosis not present

## 2019-03-12 DIAGNOSIS — M9905 Segmental and somatic dysfunction of pelvic region: Secondary | ICD-10-CM | POA: Diagnosis not present

## 2019-03-12 DIAGNOSIS — M9904 Segmental and somatic dysfunction of sacral region: Secondary | ICD-10-CM | POA: Diagnosis not present

## 2019-03-24 DIAGNOSIS — M9905 Segmental and somatic dysfunction of pelvic region: Secondary | ICD-10-CM | POA: Diagnosis not present

## 2019-03-24 DIAGNOSIS — M5136 Other intervertebral disc degeneration, lumbar region: Secondary | ICD-10-CM | POA: Diagnosis not present

## 2019-03-24 DIAGNOSIS — M9904 Segmental and somatic dysfunction of sacral region: Secondary | ICD-10-CM | POA: Diagnosis not present

## 2019-03-24 DIAGNOSIS — M9903 Segmental and somatic dysfunction of lumbar region: Secondary | ICD-10-CM | POA: Diagnosis not present

## 2019-03-26 DIAGNOSIS — M9904 Segmental and somatic dysfunction of sacral region: Secondary | ICD-10-CM | POA: Diagnosis not present

## 2019-03-26 DIAGNOSIS — M9903 Segmental and somatic dysfunction of lumbar region: Secondary | ICD-10-CM | POA: Diagnosis not present

## 2019-03-26 DIAGNOSIS — M9905 Segmental and somatic dysfunction of pelvic region: Secondary | ICD-10-CM | POA: Diagnosis not present

## 2019-03-26 DIAGNOSIS — M5136 Other intervertebral disc degeneration, lumbar region: Secondary | ICD-10-CM | POA: Diagnosis not present

## 2019-03-30 DIAGNOSIS — M9905 Segmental and somatic dysfunction of pelvic region: Secondary | ICD-10-CM | POA: Diagnosis not present

## 2019-03-30 DIAGNOSIS — M9903 Segmental and somatic dysfunction of lumbar region: Secondary | ICD-10-CM | POA: Diagnosis not present

## 2019-03-30 DIAGNOSIS — M5136 Other intervertebral disc degeneration, lumbar region: Secondary | ICD-10-CM | POA: Diagnosis not present

## 2019-03-30 DIAGNOSIS — M9904 Segmental and somatic dysfunction of sacral region: Secondary | ICD-10-CM | POA: Diagnosis not present

## 2019-03-31 DIAGNOSIS — M9903 Segmental and somatic dysfunction of lumbar region: Secondary | ICD-10-CM | POA: Diagnosis not present

## 2019-03-31 DIAGNOSIS — M9905 Segmental and somatic dysfunction of pelvic region: Secondary | ICD-10-CM | POA: Diagnosis not present

## 2019-03-31 DIAGNOSIS — M5136 Other intervertebral disc degeneration, lumbar region: Secondary | ICD-10-CM | POA: Diagnosis not present

## 2019-03-31 DIAGNOSIS — M9904 Segmental and somatic dysfunction of sacral region: Secondary | ICD-10-CM | POA: Diagnosis not present

## 2019-04-02 DIAGNOSIS — M5136 Other intervertebral disc degeneration, lumbar region: Secondary | ICD-10-CM | POA: Diagnosis not present

## 2019-04-02 DIAGNOSIS — M9903 Segmental and somatic dysfunction of lumbar region: Secondary | ICD-10-CM | POA: Diagnosis not present

## 2019-04-02 DIAGNOSIS — M9905 Segmental and somatic dysfunction of pelvic region: Secondary | ICD-10-CM | POA: Diagnosis not present

## 2019-04-02 DIAGNOSIS — M9904 Segmental and somatic dysfunction of sacral region: Secondary | ICD-10-CM | POA: Diagnosis not present

## 2019-04-06 DIAGNOSIS — M5136 Other intervertebral disc degeneration, lumbar region: Secondary | ICD-10-CM | POA: Diagnosis not present

## 2019-04-06 DIAGNOSIS — M9904 Segmental and somatic dysfunction of sacral region: Secondary | ICD-10-CM | POA: Diagnosis not present

## 2019-04-06 DIAGNOSIS — M9905 Segmental and somatic dysfunction of pelvic region: Secondary | ICD-10-CM | POA: Diagnosis not present

## 2019-04-06 DIAGNOSIS — M9903 Segmental and somatic dysfunction of lumbar region: Secondary | ICD-10-CM | POA: Diagnosis not present

## 2019-04-08 DIAGNOSIS — M9903 Segmental and somatic dysfunction of lumbar region: Secondary | ICD-10-CM | POA: Diagnosis not present

## 2019-04-08 DIAGNOSIS — M5136 Other intervertebral disc degeneration, lumbar region: Secondary | ICD-10-CM | POA: Diagnosis not present

## 2019-04-08 DIAGNOSIS — M9905 Segmental and somatic dysfunction of pelvic region: Secondary | ICD-10-CM | POA: Diagnosis not present

## 2019-04-08 DIAGNOSIS — M9904 Segmental and somatic dysfunction of sacral region: Secondary | ICD-10-CM | POA: Diagnosis not present

## 2019-04-13 DIAGNOSIS — M9903 Segmental and somatic dysfunction of lumbar region: Secondary | ICD-10-CM | POA: Diagnosis not present

## 2019-04-13 DIAGNOSIS — M9904 Segmental and somatic dysfunction of sacral region: Secondary | ICD-10-CM | POA: Diagnosis not present

## 2019-04-13 DIAGNOSIS — M5136 Other intervertebral disc degeneration, lumbar region: Secondary | ICD-10-CM | POA: Diagnosis not present

## 2019-04-13 DIAGNOSIS — M9905 Segmental and somatic dysfunction of pelvic region: Secondary | ICD-10-CM | POA: Diagnosis not present

## 2019-04-15 DIAGNOSIS — M9904 Segmental and somatic dysfunction of sacral region: Secondary | ICD-10-CM | POA: Diagnosis not present

## 2019-04-15 DIAGNOSIS — M5136 Other intervertebral disc degeneration, lumbar region: Secondary | ICD-10-CM | POA: Diagnosis not present

## 2019-04-15 DIAGNOSIS — M9903 Segmental and somatic dysfunction of lumbar region: Secondary | ICD-10-CM | POA: Diagnosis not present

## 2019-04-15 DIAGNOSIS — M9905 Segmental and somatic dysfunction of pelvic region: Secondary | ICD-10-CM | POA: Diagnosis not present

## 2019-04-21 DIAGNOSIS — M9905 Segmental and somatic dysfunction of pelvic region: Secondary | ICD-10-CM | POA: Diagnosis not present

## 2019-04-21 DIAGNOSIS — K219 Gastro-esophageal reflux disease without esophagitis: Secondary | ICD-10-CM | POA: Diagnosis not present

## 2019-04-21 DIAGNOSIS — E669 Obesity, unspecified: Secondary | ICD-10-CM | POA: Diagnosis not present

## 2019-04-21 DIAGNOSIS — M5136 Other intervertebral disc degeneration, lumbar region: Secondary | ICD-10-CM | POA: Diagnosis not present

## 2019-04-21 DIAGNOSIS — M9903 Segmental and somatic dysfunction of lumbar region: Secondary | ICD-10-CM | POA: Diagnosis not present

## 2019-04-21 DIAGNOSIS — M9904 Segmental and somatic dysfunction of sacral region: Secondary | ICD-10-CM | POA: Diagnosis not present

## 2019-04-21 DIAGNOSIS — I10 Essential (primary) hypertension: Secondary | ICD-10-CM | POA: Diagnosis not present

## 2019-04-23 DIAGNOSIS — M5136 Other intervertebral disc degeneration, lumbar region: Secondary | ICD-10-CM | POA: Diagnosis not present

## 2019-04-23 DIAGNOSIS — M9904 Segmental and somatic dysfunction of sacral region: Secondary | ICD-10-CM | POA: Diagnosis not present

## 2019-04-23 DIAGNOSIS — M9905 Segmental and somatic dysfunction of pelvic region: Secondary | ICD-10-CM | POA: Diagnosis not present

## 2019-04-23 DIAGNOSIS — M9903 Segmental and somatic dysfunction of lumbar region: Secondary | ICD-10-CM | POA: Diagnosis not present

## 2019-04-27 DIAGNOSIS — R7301 Impaired fasting glucose: Secondary | ICD-10-CM | POA: Diagnosis not present

## 2019-04-27 DIAGNOSIS — M5416 Radiculopathy, lumbar region: Secondary | ICD-10-CM | POA: Diagnosis not present

## 2019-04-27 DIAGNOSIS — K219 Gastro-esophageal reflux disease without esophagitis: Secondary | ICD-10-CM | POA: Diagnosis not present

## 2019-04-27 DIAGNOSIS — I10 Essential (primary) hypertension: Secondary | ICD-10-CM | POA: Diagnosis not present

## 2019-04-27 DIAGNOSIS — E669 Obesity, unspecified: Secondary | ICD-10-CM | POA: Diagnosis not present

## 2019-04-27 DIAGNOSIS — E785 Hyperlipidemia, unspecified: Secondary | ICD-10-CM | POA: Diagnosis not present

## 2019-04-30 DIAGNOSIS — M5136 Other intervertebral disc degeneration, lumbar region: Secondary | ICD-10-CM | POA: Diagnosis not present

## 2019-04-30 DIAGNOSIS — M9904 Segmental and somatic dysfunction of sacral region: Secondary | ICD-10-CM | POA: Diagnosis not present

## 2019-04-30 DIAGNOSIS — M9903 Segmental and somatic dysfunction of lumbar region: Secondary | ICD-10-CM | POA: Diagnosis not present

## 2019-04-30 DIAGNOSIS — M9905 Segmental and somatic dysfunction of pelvic region: Secondary | ICD-10-CM | POA: Diagnosis not present

## 2019-05-05 DIAGNOSIS — M9903 Segmental and somatic dysfunction of lumbar region: Secondary | ICD-10-CM | POA: Diagnosis not present

## 2019-05-05 DIAGNOSIS — E669 Obesity, unspecified: Secondary | ICD-10-CM | POA: Diagnosis not present

## 2019-05-05 DIAGNOSIS — M5136 Other intervertebral disc degeneration, lumbar region: Secondary | ICD-10-CM | POA: Diagnosis not present

## 2019-05-05 DIAGNOSIS — M9905 Segmental and somatic dysfunction of pelvic region: Secondary | ICD-10-CM | POA: Diagnosis not present

## 2019-05-05 DIAGNOSIS — M9904 Segmental and somatic dysfunction of sacral region: Secondary | ICD-10-CM | POA: Diagnosis not present

## 2019-05-05 DIAGNOSIS — K219 Gastro-esophageal reflux disease without esophagitis: Secondary | ICD-10-CM | POA: Diagnosis not present

## 2019-05-06 ENCOUNTER — Encounter: Payer: Self-pay | Admitting: Gastroenterology

## 2019-05-07 DIAGNOSIS — M9904 Segmental and somatic dysfunction of sacral region: Secondary | ICD-10-CM | POA: Diagnosis not present

## 2019-05-07 DIAGNOSIS — M5136 Other intervertebral disc degeneration, lumbar region: Secondary | ICD-10-CM | POA: Diagnosis not present

## 2019-05-07 DIAGNOSIS — M9903 Segmental and somatic dysfunction of lumbar region: Secondary | ICD-10-CM | POA: Diagnosis not present

## 2019-05-07 DIAGNOSIS — M9905 Segmental and somatic dysfunction of pelvic region: Secondary | ICD-10-CM | POA: Diagnosis not present

## 2019-05-11 DIAGNOSIS — M5136 Other intervertebral disc degeneration, lumbar region: Secondary | ICD-10-CM | POA: Diagnosis not present

## 2019-05-11 DIAGNOSIS — M9903 Segmental and somatic dysfunction of lumbar region: Secondary | ICD-10-CM | POA: Diagnosis not present

## 2019-05-11 DIAGNOSIS — M9905 Segmental and somatic dysfunction of pelvic region: Secondary | ICD-10-CM | POA: Diagnosis not present

## 2019-05-11 DIAGNOSIS — M9904 Segmental and somatic dysfunction of sacral region: Secondary | ICD-10-CM | POA: Diagnosis not present

## 2019-05-12 DIAGNOSIS — M5136 Other intervertebral disc degeneration, lumbar region: Secondary | ICD-10-CM | POA: Diagnosis not present

## 2019-05-12 DIAGNOSIS — M9903 Segmental and somatic dysfunction of lumbar region: Secondary | ICD-10-CM | POA: Diagnosis not present

## 2019-05-12 DIAGNOSIS — M9905 Segmental and somatic dysfunction of pelvic region: Secondary | ICD-10-CM | POA: Diagnosis not present

## 2019-05-12 DIAGNOSIS — M9904 Segmental and somatic dysfunction of sacral region: Secondary | ICD-10-CM | POA: Diagnosis not present

## 2019-05-13 DIAGNOSIS — M9904 Segmental and somatic dysfunction of sacral region: Secondary | ICD-10-CM | POA: Diagnosis not present

## 2019-05-13 DIAGNOSIS — M9903 Segmental and somatic dysfunction of lumbar region: Secondary | ICD-10-CM | POA: Diagnosis not present

## 2019-05-13 DIAGNOSIS — M9905 Segmental and somatic dysfunction of pelvic region: Secondary | ICD-10-CM | POA: Diagnosis not present

## 2019-05-13 DIAGNOSIS — M5136 Other intervertebral disc degeneration, lumbar region: Secondary | ICD-10-CM | POA: Diagnosis not present

## 2019-05-14 DIAGNOSIS — M9904 Segmental and somatic dysfunction of sacral region: Secondary | ICD-10-CM | POA: Diagnosis not present

## 2019-05-14 DIAGNOSIS — M5136 Other intervertebral disc degeneration, lumbar region: Secondary | ICD-10-CM | POA: Diagnosis not present

## 2019-05-14 DIAGNOSIS — M9903 Segmental and somatic dysfunction of lumbar region: Secondary | ICD-10-CM | POA: Diagnosis not present

## 2019-05-14 DIAGNOSIS — M9905 Segmental and somatic dysfunction of pelvic region: Secondary | ICD-10-CM | POA: Diagnosis not present

## 2019-05-19 DIAGNOSIS — M9903 Segmental and somatic dysfunction of lumbar region: Secondary | ICD-10-CM | POA: Diagnosis not present

## 2019-05-19 DIAGNOSIS — M5136 Other intervertebral disc degeneration, lumbar region: Secondary | ICD-10-CM | POA: Diagnosis not present

## 2019-05-19 DIAGNOSIS — M9905 Segmental and somatic dysfunction of pelvic region: Secondary | ICD-10-CM | POA: Diagnosis not present

## 2019-05-19 DIAGNOSIS — M9904 Segmental and somatic dysfunction of sacral region: Secondary | ICD-10-CM | POA: Diagnosis not present

## 2019-05-21 DIAGNOSIS — M5136 Other intervertebral disc degeneration, lumbar region: Secondary | ICD-10-CM | POA: Diagnosis not present

## 2019-05-21 DIAGNOSIS — M9905 Segmental and somatic dysfunction of pelvic region: Secondary | ICD-10-CM | POA: Diagnosis not present

## 2019-05-21 DIAGNOSIS — M9904 Segmental and somatic dysfunction of sacral region: Secondary | ICD-10-CM | POA: Diagnosis not present

## 2019-05-21 DIAGNOSIS — M9903 Segmental and somatic dysfunction of lumbar region: Secondary | ICD-10-CM | POA: Diagnosis not present

## 2019-05-25 DIAGNOSIS — M9904 Segmental and somatic dysfunction of sacral region: Secondary | ICD-10-CM | POA: Diagnosis not present

## 2019-05-25 DIAGNOSIS — M5136 Other intervertebral disc degeneration, lumbar region: Secondary | ICD-10-CM | POA: Diagnosis not present

## 2019-05-25 DIAGNOSIS — M9905 Segmental and somatic dysfunction of pelvic region: Secondary | ICD-10-CM | POA: Diagnosis not present

## 2019-05-25 DIAGNOSIS — M9903 Segmental and somatic dysfunction of lumbar region: Secondary | ICD-10-CM | POA: Diagnosis not present

## 2019-05-27 DIAGNOSIS — M9903 Segmental and somatic dysfunction of lumbar region: Secondary | ICD-10-CM | POA: Diagnosis not present

## 2019-05-27 DIAGNOSIS — M9904 Segmental and somatic dysfunction of sacral region: Secondary | ICD-10-CM | POA: Diagnosis not present

## 2019-05-27 DIAGNOSIS — M5136 Other intervertebral disc degeneration, lumbar region: Secondary | ICD-10-CM | POA: Diagnosis not present

## 2019-05-27 DIAGNOSIS — M9905 Segmental and somatic dysfunction of pelvic region: Secondary | ICD-10-CM | POA: Diagnosis not present

## 2019-05-28 DIAGNOSIS — M9904 Segmental and somatic dysfunction of sacral region: Secondary | ICD-10-CM | POA: Diagnosis not present

## 2019-05-28 DIAGNOSIS — M9905 Segmental and somatic dysfunction of pelvic region: Secondary | ICD-10-CM | POA: Diagnosis not present

## 2019-05-28 DIAGNOSIS — M5136 Other intervertebral disc degeneration, lumbar region: Secondary | ICD-10-CM | POA: Diagnosis not present

## 2019-05-28 DIAGNOSIS — M9903 Segmental and somatic dysfunction of lumbar region: Secondary | ICD-10-CM | POA: Diagnosis not present

## 2019-06-01 DIAGNOSIS — M9903 Segmental and somatic dysfunction of lumbar region: Secondary | ICD-10-CM | POA: Diagnosis not present

## 2019-06-01 DIAGNOSIS — M5136 Other intervertebral disc degeneration, lumbar region: Secondary | ICD-10-CM | POA: Diagnosis not present

## 2019-06-01 DIAGNOSIS — M9904 Segmental and somatic dysfunction of sacral region: Secondary | ICD-10-CM | POA: Diagnosis not present

## 2019-06-01 DIAGNOSIS — M9905 Segmental and somatic dysfunction of pelvic region: Secondary | ICD-10-CM | POA: Diagnosis not present

## 2019-06-03 DIAGNOSIS — M9903 Segmental and somatic dysfunction of lumbar region: Secondary | ICD-10-CM | POA: Diagnosis not present

## 2019-06-03 DIAGNOSIS — M5136 Other intervertebral disc degeneration, lumbar region: Secondary | ICD-10-CM | POA: Diagnosis not present

## 2019-06-03 DIAGNOSIS — M9904 Segmental and somatic dysfunction of sacral region: Secondary | ICD-10-CM | POA: Diagnosis not present

## 2019-06-03 DIAGNOSIS — M9905 Segmental and somatic dysfunction of pelvic region: Secondary | ICD-10-CM | POA: Diagnosis not present

## 2019-06-04 DIAGNOSIS — M9903 Segmental and somatic dysfunction of lumbar region: Secondary | ICD-10-CM | POA: Diagnosis not present

## 2019-06-04 DIAGNOSIS — M5136 Other intervertebral disc degeneration, lumbar region: Secondary | ICD-10-CM | POA: Diagnosis not present

## 2019-06-04 DIAGNOSIS — M9904 Segmental and somatic dysfunction of sacral region: Secondary | ICD-10-CM | POA: Diagnosis not present

## 2019-06-04 DIAGNOSIS — M9905 Segmental and somatic dysfunction of pelvic region: Secondary | ICD-10-CM | POA: Diagnosis not present

## 2019-06-08 DIAGNOSIS — M9903 Segmental and somatic dysfunction of lumbar region: Secondary | ICD-10-CM | POA: Diagnosis not present

## 2019-06-08 DIAGNOSIS — M9904 Segmental and somatic dysfunction of sacral region: Secondary | ICD-10-CM | POA: Diagnosis not present

## 2019-06-08 DIAGNOSIS — M9905 Segmental and somatic dysfunction of pelvic region: Secondary | ICD-10-CM | POA: Diagnosis not present

## 2019-06-08 DIAGNOSIS — M5136 Other intervertebral disc degeneration, lumbar region: Secondary | ICD-10-CM | POA: Diagnosis not present

## 2019-06-09 ENCOUNTER — Ambulatory Visit: Payer: Medicare Other | Admitting: Gastroenterology

## 2019-06-23 DIAGNOSIS — M9904 Segmental and somatic dysfunction of sacral region: Secondary | ICD-10-CM | POA: Diagnosis not present

## 2019-06-23 DIAGNOSIS — M9903 Segmental and somatic dysfunction of lumbar region: Secondary | ICD-10-CM | POA: Diagnosis not present

## 2019-06-23 DIAGNOSIS — M9905 Segmental and somatic dysfunction of pelvic region: Secondary | ICD-10-CM | POA: Diagnosis not present

## 2019-06-23 DIAGNOSIS — M5136 Other intervertebral disc degeneration, lumbar region: Secondary | ICD-10-CM | POA: Diagnosis not present

## 2019-07-02 DIAGNOSIS — M9904 Segmental and somatic dysfunction of sacral region: Secondary | ICD-10-CM | POA: Diagnosis not present

## 2019-07-02 DIAGNOSIS — M9905 Segmental and somatic dysfunction of pelvic region: Secondary | ICD-10-CM | POA: Diagnosis not present

## 2019-07-02 DIAGNOSIS — M5136 Other intervertebral disc degeneration, lumbar region: Secondary | ICD-10-CM | POA: Diagnosis not present

## 2019-07-02 DIAGNOSIS — M9903 Segmental and somatic dysfunction of lumbar region: Secondary | ICD-10-CM | POA: Diagnosis not present

## 2019-07-06 DIAGNOSIS — M9903 Segmental and somatic dysfunction of lumbar region: Secondary | ICD-10-CM | POA: Diagnosis not present

## 2019-07-06 DIAGNOSIS — M9904 Segmental and somatic dysfunction of sacral region: Secondary | ICD-10-CM | POA: Diagnosis not present

## 2019-07-06 DIAGNOSIS — M9905 Segmental and somatic dysfunction of pelvic region: Secondary | ICD-10-CM | POA: Diagnosis not present

## 2019-07-06 DIAGNOSIS — M5136 Other intervertebral disc degeneration, lumbar region: Secondary | ICD-10-CM | POA: Diagnosis not present

## 2019-07-16 DIAGNOSIS — M9905 Segmental and somatic dysfunction of pelvic region: Secondary | ICD-10-CM | POA: Diagnosis not present

## 2019-07-16 DIAGNOSIS — M9903 Segmental and somatic dysfunction of lumbar region: Secondary | ICD-10-CM | POA: Diagnosis not present

## 2019-07-16 DIAGNOSIS — M9904 Segmental and somatic dysfunction of sacral region: Secondary | ICD-10-CM | POA: Diagnosis not present

## 2019-07-16 DIAGNOSIS — M5136 Other intervertebral disc degeneration, lumbar region: Secondary | ICD-10-CM | POA: Diagnosis not present

## 2019-07-21 DIAGNOSIS — M9905 Segmental and somatic dysfunction of pelvic region: Secondary | ICD-10-CM | POA: Diagnosis not present

## 2019-07-21 DIAGNOSIS — M5136 Other intervertebral disc degeneration, lumbar region: Secondary | ICD-10-CM | POA: Diagnosis not present

## 2019-07-21 DIAGNOSIS — M9903 Segmental and somatic dysfunction of lumbar region: Secondary | ICD-10-CM | POA: Diagnosis not present

## 2019-07-21 DIAGNOSIS — M9904 Segmental and somatic dysfunction of sacral region: Secondary | ICD-10-CM | POA: Diagnosis not present

## 2019-07-29 DIAGNOSIS — M9903 Segmental and somatic dysfunction of lumbar region: Secondary | ICD-10-CM | POA: Diagnosis not present

## 2019-07-29 DIAGNOSIS — M9904 Segmental and somatic dysfunction of sacral region: Secondary | ICD-10-CM | POA: Diagnosis not present

## 2019-07-29 DIAGNOSIS — M9905 Segmental and somatic dysfunction of pelvic region: Secondary | ICD-10-CM | POA: Diagnosis not present

## 2019-07-29 DIAGNOSIS — M5136 Other intervertebral disc degeneration, lumbar region: Secondary | ICD-10-CM | POA: Diagnosis not present

## 2019-09-17 DIAGNOSIS — M79672 Pain in left foot: Secondary | ICD-10-CM | POA: Insufficient documentation

## 2019-09-24 DIAGNOSIS — M79672 Pain in left foot: Secondary | ICD-10-CM | POA: Diagnosis not present

## 2019-09-24 DIAGNOSIS — M5136 Other intervertebral disc degeneration, lumbar region: Secondary | ICD-10-CM | POA: Diagnosis not present

## 2019-09-24 DIAGNOSIS — M9904 Segmental and somatic dysfunction of sacral region: Secondary | ICD-10-CM | POA: Diagnosis not present

## 2019-09-24 DIAGNOSIS — M9905 Segmental and somatic dysfunction of pelvic region: Secondary | ICD-10-CM | POA: Diagnosis not present

## 2019-09-24 DIAGNOSIS — M9903 Segmental and somatic dysfunction of lumbar region: Secondary | ICD-10-CM | POA: Diagnosis not present

## 2019-10-08 DIAGNOSIS — R7301 Impaired fasting glucose: Secondary | ICD-10-CM | POA: Diagnosis not present

## 2019-10-08 DIAGNOSIS — Z125 Encounter for screening for malignant neoplasm of prostate: Secondary | ICD-10-CM | POA: Diagnosis not present

## 2019-10-08 DIAGNOSIS — E7849 Other hyperlipidemia: Secondary | ICD-10-CM | POA: Diagnosis not present

## 2019-10-15 DIAGNOSIS — I129 Hypertensive chronic kidney disease with stage 1 through stage 4 chronic kidney disease, or unspecified chronic kidney disease: Secondary | ICD-10-CM | POA: Diagnosis not present

## 2019-10-15 DIAGNOSIS — R82998 Other abnormal findings in urine: Secondary | ICD-10-CM | POA: Diagnosis not present

## 2019-10-15 DIAGNOSIS — I10 Essential (primary) hypertension: Secondary | ICD-10-CM | POA: Diagnosis not present

## 2019-10-15 DIAGNOSIS — E669 Obesity, unspecified: Secondary | ICD-10-CM | POA: Diagnosis not present

## 2019-10-15 DIAGNOSIS — M5416 Radiculopathy, lumbar region: Secondary | ICD-10-CM | POA: Diagnosis not present

## 2019-10-15 DIAGNOSIS — K219 Gastro-esophageal reflux disease without esophagitis: Secondary | ICD-10-CM | POA: Diagnosis not present

## 2019-10-15 DIAGNOSIS — N1831 Chronic kidney disease, stage 3a: Secondary | ICD-10-CM | POA: Diagnosis not present

## 2019-10-15 DIAGNOSIS — Z0000 Encounter for general adult medical examination without abnormal findings: Secondary | ICD-10-CM | POA: Diagnosis not present

## 2019-10-15 DIAGNOSIS — E785 Hyperlipidemia, unspecified: Secondary | ICD-10-CM | POA: Diagnosis not present

## 2019-11-06 DIAGNOSIS — Z1212 Encounter for screening for malignant neoplasm of rectum: Secondary | ICD-10-CM | POA: Diagnosis not present

## 2019-11-11 DIAGNOSIS — Z23 Encounter for immunization: Secondary | ICD-10-CM | POA: Diagnosis not present

## 2019-12-02 DIAGNOSIS — M9903 Segmental and somatic dysfunction of lumbar region: Secondary | ICD-10-CM | POA: Diagnosis not present

## 2019-12-02 DIAGNOSIS — M5136 Other intervertebral disc degeneration, lumbar region: Secondary | ICD-10-CM | POA: Diagnosis not present

## 2019-12-02 DIAGNOSIS — M9904 Segmental and somatic dysfunction of sacral region: Secondary | ICD-10-CM | POA: Diagnosis not present

## 2019-12-02 DIAGNOSIS — M9905 Segmental and somatic dysfunction of pelvic region: Secondary | ICD-10-CM | POA: Diagnosis not present

## 2019-12-09 DIAGNOSIS — M5136 Other intervertebral disc degeneration, lumbar region: Secondary | ICD-10-CM | POA: Diagnosis not present

## 2019-12-09 DIAGNOSIS — M9904 Segmental and somatic dysfunction of sacral region: Secondary | ICD-10-CM | POA: Diagnosis not present

## 2019-12-09 DIAGNOSIS — M9905 Segmental and somatic dysfunction of pelvic region: Secondary | ICD-10-CM | POA: Diagnosis not present

## 2019-12-09 DIAGNOSIS — M9903 Segmental and somatic dysfunction of lumbar region: Secondary | ICD-10-CM | POA: Diagnosis not present

## 2019-12-10 DIAGNOSIS — Z23 Encounter for immunization: Secondary | ICD-10-CM | POA: Diagnosis not present

## 2019-12-16 DIAGNOSIS — M9903 Segmental and somatic dysfunction of lumbar region: Secondary | ICD-10-CM | POA: Diagnosis not present

## 2019-12-16 DIAGNOSIS — M5136 Other intervertebral disc degeneration, lumbar region: Secondary | ICD-10-CM | POA: Diagnosis not present

## 2019-12-16 DIAGNOSIS — M9905 Segmental and somatic dysfunction of pelvic region: Secondary | ICD-10-CM | POA: Diagnosis not present

## 2019-12-16 DIAGNOSIS — M9904 Segmental and somatic dysfunction of sacral region: Secondary | ICD-10-CM | POA: Diagnosis not present

## 2020-03-07 DIAGNOSIS — M9903 Segmental and somatic dysfunction of lumbar region: Secondary | ICD-10-CM | POA: Diagnosis not present

## 2020-03-07 DIAGNOSIS — M5136 Other intervertebral disc degeneration, lumbar region: Secondary | ICD-10-CM | POA: Diagnosis not present

## 2020-03-07 DIAGNOSIS — M9904 Segmental and somatic dysfunction of sacral region: Secondary | ICD-10-CM | POA: Diagnosis not present

## 2020-03-07 DIAGNOSIS — M9905 Segmental and somatic dysfunction of pelvic region: Secondary | ICD-10-CM | POA: Diagnosis not present

## 2020-03-09 DIAGNOSIS — M9904 Segmental and somatic dysfunction of sacral region: Secondary | ICD-10-CM | POA: Diagnosis not present

## 2020-03-09 DIAGNOSIS — M9903 Segmental and somatic dysfunction of lumbar region: Secondary | ICD-10-CM | POA: Diagnosis not present

## 2020-03-09 DIAGNOSIS — M5136 Other intervertebral disc degeneration, lumbar region: Secondary | ICD-10-CM | POA: Diagnosis not present

## 2020-03-09 DIAGNOSIS — M9905 Segmental and somatic dysfunction of pelvic region: Secondary | ICD-10-CM | POA: Diagnosis not present

## 2020-04-13 DIAGNOSIS — I129 Hypertensive chronic kidney disease with stage 1 through stage 4 chronic kidney disease, or unspecified chronic kidney disease: Secondary | ICD-10-CM | POA: Diagnosis not present

## 2020-04-13 DIAGNOSIS — E669 Obesity, unspecified: Secondary | ICD-10-CM | POA: Diagnosis not present

## 2020-04-13 DIAGNOSIS — R7301 Impaired fasting glucose: Secondary | ICD-10-CM | POA: Diagnosis not present

## 2020-04-13 DIAGNOSIS — I10 Essential (primary) hypertension: Secondary | ICD-10-CM | POA: Diagnosis not present

## 2020-04-13 DIAGNOSIS — N1831 Chronic kidney disease, stage 3a: Secondary | ICD-10-CM | POA: Diagnosis not present

## 2020-07-26 DIAGNOSIS — Z1152 Encounter for screening for COVID-19: Secondary | ICD-10-CM | POA: Diagnosis not present

## 2020-07-26 DIAGNOSIS — U071 COVID-19: Secondary | ICD-10-CM | POA: Diagnosis not present

## 2020-07-26 DIAGNOSIS — R059 Cough, unspecified: Secondary | ICD-10-CM | POA: Diagnosis not present

## 2020-07-26 DIAGNOSIS — R5081 Fever presenting with conditions classified elsewhere: Secondary | ICD-10-CM | POA: Diagnosis not present

## 2020-10-05 DIAGNOSIS — H1045 Other chronic allergic conjunctivitis: Secondary | ICD-10-CM | POA: Diagnosis not present

## 2020-10-05 DIAGNOSIS — H2513 Age-related nuclear cataract, bilateral: Secondary | ICD-10-CM | POA: Diagnosis not present

## 2020-10-05 DIAGNOSIS — H43813 Vitreous degeneration, bilateral: Secondary | ICD-10-CM | POA: Diagnosis not present

## 2020-10-12 DIAGNOSIS — M5136 Other intervertebral disc degeneration, lumbar region: Secondary | ICD-10-CM | POA: Diagnosis not present

## 2020-10-12 DIAGNOSIS — M9904 Segmental and somatic dysfunction of sacral region: Secondary | ICD-10-CM | POA: Diagnosis not present

## 2020-10-12 DIAGNOSIS — M9903 Segmental and somatic dysfunction of lumbar region: Secondary | ICD-10-CM | POA: Diagnosis not present

## 2020-10-12 DIAGNOSIS — M9905 Segmental and somatic dysfunction of pelvic region: Secondary | ICD-10-CM | POA: Diagnosis not present

## 2020-10-13 DIAGNOSIS — E785 Hyperlipidemia, unspecified: Secondary | ICD-10-CM | POA: Diagnosis not present

## 2020-10-13 DIAGNOSIS — R7301 Impaired fasting glucose: Secondary | ICD-10-CM | POA: Diagnosis not present

## 2020-10-13 DIAGNOSIS — Z125 Encounter for screening for malignant neoplasm of prostate: Secondary | ICD-10-CM | POA: Diagnosis not present

## 2020-10-18 DIAGNOSIS — I10 Essential (primary) hypertension: Secondary | ICD-10-CM | POA: Diagnosis not present

## 2020-10-18 DIAGNOSIS — M9905 Segmental and somatic dysfunction of pelvic region: Secondary | ICD-10-CM | POA: Diagnosis not present

## 2020-10-18 DIAGNOSIS — K921 Melena: Secondary | ICD-10-CM | POA: Diagnosis not present

## 2020-10-18 DIAGNOSIS — M9904 Segmental and somatic dysfunction of sacral region: Secondary | ICD-10-CM | POA: Diagnosis not present

## 2020-10-18 DIAGNOSIS — M9903 Segmental and somatic dysfunction of lumbar region: Secondary | ICD-10-CM | POA: Diagnosis not present

## 2020-10-18 DIAGNOSIS — R82998 Other abnormal findings in urine: Secondary | ICD-10-CM | POA: Diagnosis not present

## 2020-10-18 DIAGNOSIS — M5136 Other intervertebral disc degeneration, lumbar region: Secondary | ICD-10-CM | POA: Diagnosis not present

## 2020-10-19 DIAGNOSIS — M9904 Segmental and somatic dysfunction of sacral region: Secondary | ICD-10-CM | POA: Diagnosis not present

## 2020-10-19 DIAGNOSIS — M9905 Segmental and somatic dysfunction of pelvic region: Secondary | ICD-10-CM | POA: Diagnosis not present

## 2020-10-19 DIAGNOSIS — M5136 Other intervertebral disc degeneration, lumbar region: Secondary | ICD-10-CM | POA: Diagnosis not present

## 2020-10-19 DIAGNOSIS — M9903 Segmental and somatic dysfunction of lumbar region: Secondary | ICD-10-CM | POA: Diagnosis not present

## 2020-10-24 DIAGNOSIS — M5136 Other intervertebral disc degeneration, lumbar region: Secondary | ICD-10-CM | POA: Diagnosis not present

## 2020-10-24 DIAGNOSIS — Z1339 Encounter for screening examination for other mental health and behavioral disorders: Secondary | ICD-10-CM | POA: Diagnosis not present

## 2020-10-24 DIAGNOSIS — Z1331 Encounter for screening for depression: Secondary | ICD-10-CM | POA: Diagnosis not present

## 2020-10-24 DIAGNOSIS — M9904 Segmental and somatic dysfunction of sacral region: Secondary | ICD-10-CM | POA: Diagnosis not present

## 2020-10-24 DIAGNOSIS — M5416 Radiculopathy, lumbar region: Secondary | ICD-10-CM | POA: Diagnosis not present

## 2020-10-24 DIAGNOSIS — K219 Gastro-esophageal reflux disease without esophagitis: Secondary | ICD-10-CM | POA: Diagnosis not present

## 2020-10-24 DIAGNOSIS — N1831 Chronic kidney disease, stage 3a: Secondary | ICD-10-CM | POA: Diagnosis not present

## 2020-10-24 DIAGNOSIS — M9905 Segmental and somatic dysfunction of pelvic region: Secondary | ICD-10-CM | POA: Diagnosis not present

## 2020-10-24 DIAGNOSIS — I129 Hypertensive chronic kidney disease with stage 1 through stage 4 chronic kidney disease, or unspecified chronic kidney disease: Secondary | ICD-10-CM | POA: Diagnosis not present

## 2020-10-24 DIAGNOSIS — Z0000 Encounter for general adult medical examination without abnormal findings: Secondary | ICD-10-CM | POA: Diagnosis not present

## 2020-10-24 DIAGNOSIS — E785 Hyperlipidemia, unspecified: Secondary | ICD-10-CM | POA: Diagnosis not present

## 2020-10-24 DIAGNOSIS — M9903 Segmental and somatic dysfunction of lumbar region: Secondary | ICD-10-CM | POA: Diagnosis not present

## 2020-10-25 DIAGNOSIS — M9905 Segmental and somatic dysfunction of pelvic region: Secondary | ICD-10-CM | POA: Diagnosis not present

## 2020-10-25 DIAGNOSIS — M5136 Other intervertebral disc degeneration, lumbar region: Secondary | ICD-10-CM | POA: Diagnosis not present

## 2020-10-25 DIAGNOSIS — M9904 Segmental and somatic dysfunction of sacral region: Secondary | ICD-10-CM | POA: Diagnosis not present

## 2020-10-25 DIAGNOSIS — M9903 Segmental and somatic dysfunction of lumbar region: Secondary | ICD-10-CM | POA: Diagnosis not present

## 2020-10-26 DIAGNOSIS — M9903 Segmental and somatic dysfunction of lumbar region: Secondary | ICD-10-CM | POA: Diagnosis not present

## 2020-10-26 DIAGNOSIS — M5136 Other intervertebral disc degeneration, lumbar region: Secondary | ICD-10-CM | POA: Diagnosis not present

## 2020-10-26 DIAGNOSIS — M9904 Segmental and somatic dysfunction of sacral region: Secondary | ICD-10-CM | POA: Diagnosis not present

## 2020-10-26 DIAGNOSIS — M9905 Segmental and somatic dysfunction of pelvic region: Secondary | ICD-10-CM | POA: Diagnosis not present

## 2020-10-30 NOTE — Progress Notes (Signed)
Cardiology Office Note:    Date:  11/01/2020   ID:  Joseph Compton, DOB [birth year], MRN [id number]  PCP:  Burnard Bunting, MD   [place]  Cardiologist:  [name], MD  Advanced Practice Provider:  No care team member to display Electrophysiologist:  None    Referring MD: Burnard Bunting, MD     History of Present Illness:    Joseph Compton is a 65 y.o. male with a hx of HTN, HLD, GERD and family history of CAD who was referred by Dr. Reynaldo Minium for further evaluation of chest discomfort.  The patient states that he develops pressure/burning in the upper chest. Usually occurs after eating a big meal or when laying down at night. No exertional chest discomfort. He is able to cycle about 52minutes every other day and walks 2-49miles about 3-4x/week without symptoms. Occasional LE edema; occasional palpitations. No orthopnea, PND, nausea or vomiting. Occasional snoring. Blood pressure is well controlled at 100s/70s.  Family history father with CHF in his 56s; Mother with CAD and DMII.   TC 157, TG 137, HDL 52, LDL 78  Past Medical History:  Diagnosis Date  . Arthritis   . Chest pain on exertion   . Chronic kidney disease, stage 3a ([place])   . GERD (gastroesophageal reflux disease)   . History of Helicobacter pylori infection    in [place]  . Hyperlipemia   . Hypertension   . Hypertensive kidney disease with CKD (chronic kidney disease) stage I   . Impaired fasting glucose   . Obesity   . Radiculopathy, lumbar region     History reviewed. No pertinent surgical history.  Current Medications: Current Meds  Medication Sig  . CYCLOBENZAPRINE HCL PO Take 10 mg by mouth 2 (two) times daily.  Marland Kitchen ibuprofen (ADVIL) 600 MG tablet Take 600 mg by mouth every 6 (six) hours as needed.  . metoprolol succinate (TOPROL-XL) 25 MG 24 hr tablet Take 12.5 mg by mouth daily.  Marland Kitchen olmesartan-hydrochlorothiazide (BENICAR HCT) 20-12.5 MG tablet Take 1 tablet by  mouth daily.  Marland Kitchen omeprazole (PRILOSEC) 40 MG capsule 1 CAP 30MINS BEFORE BREAKFAST DAILY  . predniSONE (DELTASONE) 5 MG tablet Take 5 mg by mouth daily with breakfast.  . rosuvastatin (CRESTOR) 20 MG tablet Take 20 mg by mouth daily.     Allergies:   Patient has no known allergies.   Social History   Socioeconomic History  . Marital status: Unknown    Spouse name: Not on file  . Number of children: Not on file  . Years of education: Not on file  . Highest education level: Not on file  Occupational History  . Not on file  Tobacco Use  . Smoking status: Never Smoker  . Smokeless tobacco: Never Used  Substance and Sexual Activity  . Alcohol use: Never  . Drug use: Never  . Sexual activity: Yes  Other Topics Concern  . Not on file  Social History Narrative  . Not on file   Social Determinants of Health   Financial Resource Strain: Not on file  Food Insecurity: Not on file  Transportation Needs: Not on file  Physical Activity: Not on file  Stress: Not on file  Social Connections: Not on file     Family History: The patient's family history includes Angina in his father; Asthma in his father; Diabetes in his mother; Heart Problems in his father and mother; Heart failure in his father and mother; Hyperlipidemia in his father and  mother; Hypertension in his mother; Hypothyroidism in his mother; Other in his father.  ROS:   Please see the history of present illness.    Review of Systems  Constitutional: Negative for chills, diaphoresis and fever.  HENT: Negative for hearing loss.   Eyes: Negative for blurred vision and redness.  Respiratory: Negative for shortness of breath.   Cardiovascular: Positive for chest pain, palpitations and leg swelling. Negative for orthopnea and claudication.  Gastrointestinal: Positive for heartburn. Negative for melena, nausea and vomiting.  Genitourinary: Negative for hematuria.  Musculoskeletal: Negative for falls and myalgias.   Neurological: Negative for dizziness and loss of consciousness.  Endo/Heme/Allergies: Negative for polydipsia.  Psychiatric/Behavioral: Negative for substance abuse.    EKGs/Labs/Other Studies Reviewed:    The following studies were reviewed today: No cardiac studies in our system  EKG:  Unable to obtain today as leads would not stick to his chest and no razor available to shave.  Recent Labs: No results found for requested labs within last 8760 hours.  Recent Lipid Panel No results found for: CHOL, TRIG, HDL, CHOLHDL, VLDL, LDLCALC, LDLDIRECT   Physical Exam:    VS:  BP 112/84   Pulse 64   Ht 5\' 9"  (1.753 m)   Wt 270 lb 9.6 oz (122.7 kg)   SpO2 95%   BMI 39.96 kg/m     Wt Readings from Last 3 Encounters:  11/01/20 270 lb 9.6 oz (122.7 kg)     GEN:  Well nourished, well developed in no acute distress HEENT: Normal NECK: No JVD; No carotid bruits CARDIAC: RRR, no murmurs, rubs, gallops RESPIRATORY:  Clear to auscultation without rales, wheezing or rhonchi  ABDOMEN: Soft, non-tender, non-distended MUSCULOSKELETAL:  No edema; No deformity  SKIN: Warm and dry NEUROLOGIC:  Alert and oriented x 3 PSYCHIATRIC:  Normal affect   ASSESSMENT:    1. Gastroesophageal reflux disease, unspecified whether esophagitis present   2. Precordial pain   3. Mixed hyperlipidemia   4. Primary hypertension   5. OSA (obstructive sleep apnea)    PLAN:    In order of problems listed above:  #Non-Cardiac Chest Pain: #GERD: Patient with substernal chest burning and pressure that occurs after eating a heavy meal especially when he lays down after a heavy meal. No exertional symptoms. Able to bike 22min every other day and walk 2-3 miles 3-4x/week with his dog without issues. Notably has stopped his omeprazole. Discussed that his symptoms sound very consistent with GERD and he should resume his omeprazole, watch his meal sizes and continue his weight loss efforts.  #HTN: Well controlled  at home. -Continue metop succinate 25mg  XL daily -Continue olmesartan-HCTZ 20-12.5mg   #HLD: #Family history of CAD: -Continue crestor 20mg  daily -Check coronary calcium score for risk factor modification  #Suspected OSA: Patient with episodes of snoring and wakes up frequently in the middle of the night. Concerned he may have OSA. -Check sleep study    Medication Adjustments/Labs and Tests Ordered: Current medicines are reviewed at length with the patient today.  Concerns regarding medicines are outlined above.  Orders Placed This Encounter  Procedures  . CT CARDIAC SCORING (SELF PAY ONLY)  . Split night study   No orders of the defined types were placed in this encounter.   Patient Instructions  Medication Instructions:  1) RESTART OMEPRAZOLE *If you need a refill on your cardiac medications before your next appointment, please call your pharmacy*  Testing/Procedures: Dr. [name] recommends you have a CALCIUM SCORE.  Your physician has  recommended that you have a sleep study. This test records several body functions during sleep, including: brain activity, eye movement, oxygen and carbon dioxide blood levels, heart rate and rhythm, breathing rate and rhythm, the flow of air through your mouth and nose, snoring, body muscle movements, and chest and belly movement.  Follow-Up: At [facility], you and your health needs are our priority.  As part of our continuing mission to provide you with exceptional heart care, we have created designated Provider Care Teams.  These Care Teams include your primary Cardiologist (physician) and Advanced Practice Providers (APPs -  Physician Assistants and Nurse Practitioners) who all work together to provide you with the care you need, when you need it. Your next appointment:   4 month(s) The format for your next appointment:   In Person Provider:   You may see Dr. [name] or one of the following Advanced Practice Providers on your  designated Care Team:    Richardson Dopp, PA-C  Robbie Lis, [place]      Signed, [name], MD  11/01/2020 2:44 PM    [place]

## 2020-11-01 ENCOUNTER — Other Ambulatory Visit: Payer: Self-pay

## 2020-11-01 ENCOUNTER — Ambulatory Visit (INDEPENDENT_AMBULATORY_CARE_PROVIDER_SITE_OTHER): Payer: Medicare Other | Admitting: Cardiology

## 2020-11-01 VITALS — BP 112/84 | HR 64 | Ht 69.0 in | Wt 270.6 lb

## 2020-11-01 DIAGNOSIS — I10 Essential (primary) hypertension: Secondary | ICD-10-CM | POA: Diagnosis not present

## 2020-11-01 DIAGNOSIS — G4733 Obstructive sleep apnea (adult) (pediatric): Secondary | ICD-10-CM | POA: Diagnosis not present

## 2020-11-01 DIAGNOSIS — K219 Gastro-esophageal reflux disease without esophagitis: Secondary | ICD-10-CM | POA: Diagnosis not present

## 2020-11-01 DIAGNOSIS — E782 Mixed hyperlipidemia: Secondary | ICD-10-CM | POA: Diagnosis not present

## 2020-11-01 DIAGNOSIS — R072 Precordial pain: Secondary | ICD-10-CM | POA: Diagnosis not present

## 2020-11-01 DIAGNOSIS — M9904 Segmental and somatic dysfunction of sacral region: Secondary | ICD-10-CM | POA: Diagnosis not present

## 2020-11-01 DIAGNOSIS — M5136 Other intervertebral disc degeneration, lumbar region: Secondary | ICD-10-CM | POA: Diagnosis not present

## 2020-11-01 DIAGNOSIS — M9903 Segmental and somatic dysfunction of lumbar region: Secondary | ICD-10-CM | POA: Diagnosis not present

## 2020-11-01 DIAGNOSIS — M9905 Segmental and somatic dysfunction of pelvic region: Secondary | ICD-10-CM | POA: Diagnosis not present

## 2020-11-01 NOTE — Patient Instructions (Addendum)
Medication Instructions:  1) RESTART OMEPRAZOLE *If you need a refill on your cardiac medications before your next appointment, please call your pharmacy*  Testing/Procedures: Dr. [name] recommends you have a CALCIUM SCORE.  Your physician has recommended that you have a sleep study. This test records several body functions during sleep, including: brain activity, eye movement, oxygen and carbon dioxide blood levels, heart rate and rhythm, breathing rate and rhythm, the flow of air through your mouth and nose, snoring, body muscle movements, and chest and belly movement.  Follow-Up: At [facility], you and your health needs are our priority.  As part of our continuing mission to provide you with exceptional heart care, we have created designated Provider Care Teams.  These Care Teams include your primary Cardiologist (physician) and Advanced Practice Providers (APPs -  Physician Assistants and Nurse Practitioners) who all work together to provide you with the care you need, when you need it. Your next appointment:   4 month(s) The format for your next appointment:   In Person Provider:   You may see Dr. [name] or one of the following Advanced Practice Providers on your designated Care Team:    Richardson Dopp, PA-C  [place], [place]

## 2020-11-25 ENCOUNTER — Other Ambulatory Visit: Payer: Medicare Other

## 2020-12-05 ENCOUNTER — Emergency Department (HOSPITAL_BASED_OUTPATIENT_CLINIC_OR_DEPARTMENT_OTHER): Payer: Medicare Other

## 2020-12-05 ENCOUNTER — Emergency Department (HOSPITAL_BASED_OUTPATIENT_CLINIC_OR_DEPARTMENT_OTHER)
Admission: EM | Admit: 2020-12-05 | Discharge: 2020-12-05 | Disposition: A | Discharge status: Home / Self Care | Payer: Medicare Other | Attending: Emergency Medicine | Admitting: Emergency Medicine

## 2020-12-05 ENCOUNTER — Other Ambulatory Visit: Payer: Self-pay

## 2020-12-05 ENCOUNTER — Encounter (HOSPITAL_BASED_OUTPATIENT_CLINIC_OR_DEPARTMENT_OTHER): Payer: Self-pay | Admitting: *Deleted

## 2020-12-05 DIAGNOSIS — Z79899 Other long term (current) drug therapy: Secondary | ICD-10-CM | POA: Diagnosis not present

## 2020-12-05 DIAGNOSIS — R600 Localized edema: Secondary | ICD-10-CM | POA: Diagnosis not present

## 2020-12-05 DIAGNOSIS — I129 Hypertensive chronic kidney disease with stage 1 through stage 4 chronic kidney disease, or unspecified chronic kidney disease: Secondary | ICD-10-CM | POA: Insufficient documentation

## 2020-12-05 DIAGNOSIS — M7989 Other specified soft tissue disorders: Secondary | ICD-10-CM | POA: Diagnosis not present

## 2020-12-05 DIAGNOSIS — M109 Gout, unspecified: Secondary | ICD-10-CM | POA: Diagnosis not present

## 2020-12-05 DIAGNOSIS — M7122 Synovial cyst of popliteal space [Baker], left knee: Secondary | ICD-10-CM | POA: Diagnosis not present

## 2020-12-05 DIAGNOSIS — N1831 Chronic kidney disease, stage 3a: Secondary | ICD-10-CM | POA: Diagnosis not present

## 2020-12-05 MED ORDER — PREDNISONE 50 MG PO TABS
60.0000 mg | ORAL_TABLET | Freq: Once | ORAL | Status: AC
  Administered 2020-12-05: 60 mg via ORAL
  Filled 2020-12-05: qty 1

## 2020-12-05 MED ORDER — PREDNISONE 50 MG PO TABS: 50.0000 mg | ORAL_TABLET | Freq: Every day | ORAL | 0 refills | Status: AC

## 2020-12-05 NOTE — ED Provider Notes (Signed)
[facility] EMERGENCY DEPARTMENT Provider Note   CSN: [id number] Arrival date & time: 12/05/20  1515     History Chief Complaint  Patient presents with  . Leg Swelling    Joseph Compton is a 65 y.o. male with past medical history of HTN, HLD, CKD, and chronic low back pain who presents the ED with complaints of atraumatic left leg pain and redness x5 days.  On my examination, patient is point towards his first MTP joint when describing his area of pain.  He states that he is having pain with ambulation.  He denies any obvious precipitating injury.  He does use a spin bike regularly because he is trying his best to lose weight.  He states that he had a similar event occur a while ago involving his right foot where it hurt and approximately the same area.  He was never evaluated or diagnosed with an inflammatory arthritis or anything similar.  He now has swelling approximately to his area of pain involving his ankle and distal calf.  He denies any pain in that area.  Denies any history of clots or clotting disorder, chest pain or shortness of breath, cough, recent illness or infection, fevers or chills, or any other symptoms.  HPI     Past Medical History:  Diagnosis Date  . Arthritis   . Chest pain on exertion   . Chronic kidney disease, stage 3a ([facility])   . GERD (gastroesophageal reflux disease)   . History of Helicobacter pylori infection    in [place]  . Hyperlipemia   . Hypertension   . Hypertensive kidney disease with CKD (chronic kidney disease) stage I   . Impaired fasting glucose   . Obesity   . Radiculopathy, lumbar region     There are no problems to display for this patient.   History reviewed. No pertinent surgical history.     Family History  Problem Relation Age of Onset  . Diabetes Mother   . Hypertension Mother   . Hyperlipidemia Mother   . Heart Problems Mother   . Heart failure Mother   . Hypothyroidism Mother   . Hyperlipidemia  Father   . Heart Problems Father   . Angina Father   . Asthma Father   . Other Father        stomach ulcer  . Heart failure Father     Social History   Tobacco Use  . Smoking status: Never Smoker  . Smokeless tobacco: Never Used  Substance Use Topics  . Alcohol use: Never  . Drug use: Never    Home Medications Prior to Admission medications   Medication Sig Start Date End Date Taking? Authorizing Provider  predniSONE (DELTASONE) 50 MG tablet Take 1 tablet (50 mg total) by mouth daily with breakfast for 5 days. 12/05/20 12/10/20 Yes Corena Herter, PA-C  CYCLOBENZAPRINE HCL PO Take 10 mg by mouth 2 (two) times daily.    [provider]  ibuprofen (ADVIL) 600 MG tablet Take 600 mg by mouth every 6 (six) hours as needed.    [provider]  metoprolol succinate (TOPROL-XL) 25 MG 24 hr tablet Take 12.5 mg by mouth daily.    [provider]  olmesartan-hydrochlorothiazide (BENICAR HCT) 20-12.5 MG tablet Take 1 tablet by mouth daily.    [provider]  omeprazole (PRILOSEC) 40 MG capsule 1 CAP [place] DAILY 04/21/19   [provider]  rosuvastatin (CRESTOR) 20 MG tablet Take 20 mg by mouth  daily.    [provider]    Allergies    Patient has no known allergies.  Review of Systems   Review of Systems  All other systems reviewed and are negative.   Physical Exam Updated Vital Signs BP (!) 135/98 (BP Location: Right Arm)   Pulse 83   Temp 98.9 F (37.2 C) (Oral)   Resp 20   Ht 5\' 9"  (1.753 m)   Wt 115.7 kg   SpO2 99%   BMI 37.66 kg/m   Physical Exam Vitals and nursing note reviewed. Exam conducted with a chaperone present.  Constitutional:      General: He is not in acute distress.    Appearance: He is not toxic-appearing.  HENT:     Head: Normocephalic and atraumatic.  Eyes:     General: No scleral icterus.    Conjunctiva/sclera: Conjunctivae normal.  Cardiovascular:     Rate and Rhythm:  Normal rate.     Pulses: Normal pulses.  Pulmonary:     Effort: Pulmonary effort is normal. No respiratory distress.  Musculoskeletal:        General: Swelling and tenderness present.     Comments: Left foot: Pedal pulse intact and symmetric with contralateral foot.  Sensation intact throughout.  TTP over dorsal and plantar aspects of first MTP joint.  Patient is able to flex and extend first toe, albeit mildly limited due to pain symptoms.  There is overlying erythema, no significant induration. Left ankle: Considerable swelling relative to right ankle which extends proximally involving calf.  Mildly erythematous, but no significant tenderness.  Plantar flexion and dorsiflexion again relatively limited due to pain symptoms. Left knee: Normal.  Skin:    General: Skin is dry.  Neurological:     General: No focal deficit present.     Mental Status: He is alert and oriented to person, place, and time.     GCS: GCS eye subscore is 4. GCS verbal subscore is 5. GCS motor subscore is 6.     Cranial Nerves: No cranial nerve deficit.     Sensory: No sensory deficit.  Psychiatric:        Mood and Affect: Mood normal.        Behavior: Behavior normal.        Thought Content: Thought content normal.     ED Results / Procedures / Treatments   Labs (all labs ordered are listed, but only abnormal results are displayed) Labs Reviewed - No data to display  EKG None  Radiology US Venous Img Lower  Left (DVT Study)  Result Date: 12/05/2020 CLINICAL DATA:  Swelling EXAM: LEFT LOWER EXTREMITY VENOUS DOPPLER ULTRASOUND TECHNIQUE: Gray-scale sonography with compression, as well as color and duplex ultrasound, were performed to evaluate the deep venous system(s) from the level of the common femoral vein through the popliteal and proximal calf veins. COMPARISON:  None. FINDINGS: VENOUS Normal compressibility of the common femoral, superficial femoral, and popliteal veins, as well as the visualized calf  veins. Visualized portions of profunda femoral vein and great saphenous vein unremarkable. No filling defects to suggest DVT on grayscale or color Doppler imaging. Doppler waveforms show normal direction of venous flow, normal respiratory plasticity and response to augmentation. Limited views of the contralateral common femoral vein are unremarkable. OTHER Approximately 7.2 x 2.3 x 4.2 cm complex cyst within the left popliteal fossa without significant vascularity on Doppler. IMPRESSION: 1. No evidence of DVT. 2. Approximately 7.2 cm complex cyst within the left popliteal fossa,  nonspecific but most likely a Baker's cyst. Electronically Signed   By: Margaretha Sheffield MD   On: 12/05/2020 16:57   DG Foot Complete Left  Result Date: 12/05/2020 CLINICAL DATA:  65 year old male with left foot swelling. EXAM: LEFT FOOT - COMPLETE 3+ VIEW COMPARISON:  None. FINDINGS: There is no acute fracture or dislocation. Mild osteopenia. No significant arthritic changes. Diffuse subcutaneous edema. No radiopaque foreign object or soft tissue gas. IMPRESSION: 1. No acute fracture or dislocation. 2. Diffuse subcutaneous edema. Electronically Signed   By: Anner Crete M.D.   On: 12/05/2020 18:39    Procedures Procedures   Medications Ordered in ED Medications  predniSONE (DELTASONE) tablet 60 mg (60 mg Oral Given 12/05/20 1613)    ED Course  I have reviewed the triage vital signs and the nursing notes.  Pertinent labs & imaging results that were available during my care of the patient were reviewed by me and considered in my medical decision making (see chart for details).    MDM Rules/Calculators/A&P                          Ryman Rathgeber was evaluated in Emergency Department on 12/05/2020 for the symptoms described in the history of present illness. He was evaluated in the context of the global COVID-19 pandemic, which necessitated consideration that the patient might be at risk for infection with the  SARS-CoV-2 virus that causes COVID-19. Institutional protocols and algorithms that pertain to the evaluation of patients at risk for COVID-19 are in a state of rapid change based on information released by regulatory bodies including the CDC and federal and state organizations. These policies and algorithms were followed during the patient's care in the ED.  I personally reviewed patient's medical chart and all notes from triage and staff during today's encounter. I have also ordered and reviewed all labs and imaging that I felt to be medically necessary in the evaluation of this patient's complaints and with consideration of their physical exam. If needed, translation services were available and utilized.   Patient with atraumatic left foot pain and left calf/ankle swelling and erythema.  His history and physical exam is actually suggestive of gouty arthritis, but given the asymmetric LLE swelling and edema, DVT ultrasound was obtained.  Given his CKD, will proceed with prednisone as treatment rather than NSAIDs.  He states that he recently had laboratory work-up which was reassuring, however he is followed by [facility] and I cannot review his recent encounters or labs.  He states that the prednisone is well-tolerated and he has taken in the past for his back pain.  He denies any fevers and there is no cellulitic changes on my exam.  I have lower suspicion for cellulitis.  DVT study reveals a 7.2 cm complex cyst in the popliteal region, likely a Baker's cyst.  No evidence of DVT.  Plain films are also obtained given patient's concern for stress fracture.  They are personally reviewed and demonstrate no evidence of fracture or dislocation.  We will discharge him home with prednisone x5 days.  I have very low suspicion for cellulitis or septic arthritis.  However, strict ED return precautions discussed.  Patient voices understanding and is agreeable to the plan.  He can follow-up with  his primary care provider.  Final Clinical Impression(s) / ED Diagnoses Final diagnoses:  Acute gout of right foot, unspecified cause    Rx / DC Orders ED Discharge Orders  Ordered    predniSONE (DELTASONE) 50 MG tablet  Daily with breakfast        12/05/20 1909           Corena Herter, PA-C 12/05/20 1909    Lorelle Gibbs, DO 12/07/20 [phone]

## 2020-12-05 NOTE — ED Triage Notes (Signed)
C/o left leg  Pain/ redness  and swelling x 5 days , denies injury

## 2020-12-05 NOTE — Discharge Instructions (Signed)
Your x-ray was without any acute fractures or dislocation.  DVT study was also without any evidence of a deep venous thrombosis (clot).  Your history and physical exam is suggestive of gouty arthritis.  Please follow-up with your primary care provider regarding today's encounter for ongoing evaluation and management.  Please take the prednisone, as directed.  Return to the ED or seek immediate medical attention should you experience any high fevers, worsening swelling, worsening redness or pain, or any other new or worsening symptoms.

## 2020-12-05 NOTE — ED Notes (Signed)
Swelling and pain to left lower legs x 5 days. + pedal pulse.  Pitting edema.  [place] to back of calf.  Pain with flexing of foot radiating into toes.

## 2020-12-12 ENCOUNTER — Inpatient Hospital Stay: Admission: RE | Admit: 2020-12-12 | Payer: Self-pay | Source: Ambulatory Visit

## 2021-01-17 DIAGNOSIS — M25562 Pain in left knee: Secondary | ICD-10-CM | POA: Diagnosis not present

## 2021-02-06 ENCOUNTER — Ambulatory Visit (HOSPITAL_COMMUNITY): Payer: Medicare Other | Attending: Cardiology

## 2021-02-06 DIAGNOSIS — M9905 Segmental and somatic dysfunction of pelvic region: Secondary | ICD-10-CM | POA: Diagnosis not present

## 2021-02-06 DIAGNOSIS — M9903 Segmental and somatic dysfunction of lumbar region: Secondary | ICD-10-CM | POA: Diagnosis not present

## 2021-02-06 DIAGNOSIS — M5136 Other intervertebral disc degeneration, lumbar region: Secondary | ICD-10-CM | POA: Diagnosis not present

## 2021-02-06 DIAGNOSIS — M9904 Segmental and somatic dysfunction of sacral region: Secondary | ICD-10-CM | POA: Diagnosis not present

## 2021-02-08 DIAGNOSIS — M5136 Other intervertebral disc degeneration, lumbar region: Secondary | ICD-10-CM | POA: Diagnosis not present

## 2021-02-08 DIAGNOSIS — M9903 Segmental and somatic dysfunction of lumbar region: Secondary | ICD-10-CM | POA: Diagnosis not present

## 2021-02-08 DIAGNOSIS — M9905 Segmental and somatic dysfunction of pelvic region: Secondary | ICD-10-CM | POA: Diagnosis not present

## 2021-02-08 DIAGNOSIS — M9904 Segmental and somatic dysfunction of sacral region: Secondary | ICD-10-CM | POA: Diagnosis not present

## 2021-02-15 DIAGNOSIS — M9905 Segmental and somatic dysfunction of pelvic region: Secondary | ICD-10-CM | POA: Diagnosis not present

## 2021-02-15 DIAGNOSIS — M9904 Segmental and somatic dysfunction of sacral region: Secondary | ICD-10-CM | POA: Diagnosis not present

## 2021-02-15 DIAGNOSIS — M5136 Other intervertebral disc degeneration, lumbar region: Secondary | ICD-10-CM | POA: Diagnosis not present

## 2021-02-15 DIAGNOSIS — M9903 Segmental and somatic dysfunction of lumbar region: Secondary | ICD-10-CM | POA: Diagnosis not present

## 2021-02-17 ENCOUNTER — Other Ambulatory Visit: Payer: Self-pay

## 2021-02-17 ENCOUNTER — Encounter (HOSPITAL_BASED_OUTPATIENT_CLINIC_OR_DEPARTMENT_OTHER): Payer: Self-pay | Admitting: *Deleted

## 2021-02-17 ENCOUNTER — Emergency Department (HOSPITAL_BASED_OUTPATIENT_CLINIC_OR_DEPARTMENT_OTHER)
Admission: EM | Admit: 2021-02-17 | Discharge: 2021-02-17 | Disposition: A | Discharge status: Home / Self Care | Payer: Medicare Other | Attending: Emergency Medicine | Admitting: Emergency Medicine

## 2021-02-17 DIAGNOSIS — N1831 Chronic kidney disease, stage 3a: Secondary | ICD-10-CM | POA: Diagnosis not present

## 2021-02-17 DIAGNOSIS — I129 Hypertensive chronic kidney disease with stage 1 through stage 4 chronic kidney disease, or unspecified chronic kidney disease: Secondary | ICD-10-CM | POA: Diagnosis not present

## 2021-02-17 DIAGNOSIS — Z79899 Other long term (current) drug therapy: Secondary | ICD-10-CM | POA: Insufficient documentation

## 2021-02-17 DIAGNOSIS — L02212 Cutaneous abscess of back [any part, except buttock]: Secondary | ICD-10-CM | POA: Insufficient documentation

## 2021-02-17 DIAGNOSIS — L0291 Cutaneous abscess, unspecified: Secondary | ICD-10-CM

## 2021-02-17 MED ORDER — DOXYCYCLINE HYCLATE 100 MG PO CAPS: 100.0000 mg | ORAL_CAPSULE | Freq: Two times a day (BID) | ORAL | 0 refills | Status: AC

## 2021-02-17 MED ORDER — LIDOCAINE HCL (PF) 1 % IJ SOLN
5.0000 mL | Freq: Once | INTRAMUSCULAR | Status: AC
  Administered 2021-02-17: 5 mL
  Filled 2021-02-17: qty 5

## 2021-02-17 MED ORDER — LIDOCAINE-EPINEPHRINE-TETRACAINE (LET) TOPICAL GEL
3.0000 mL | Freq: Once | TOPICAL | Status: AC
  Administered 2021-02-17: 3 mL via TOPICAL
  Filled 2021-02-17: qty 3

## 2021-02-17 NOTE — ED Provider Notes (Signed)
[facility] Provider Note   CSN: [id number] Arrival date & time: 02/17/21  1301     History Chief Complaint  Patient presents with   Insect Bite    Joseph Compton is a 65 y.o. male with past medical history of hypertension, hyperlipidemia, CKD that presents to the emergency department today for questionable insect bite.  Patient has an area of redness and itching to his mid back that he thinks is an insect bite, did not feel an insect bite.  Denies any fevers, nausea, vomiting..  Denies any IV drug use.  Patient states that he is generally healthy.  Has not been taking anything for this, states it is more itchy slightly painful.  States that this is bothering him since Monday.  Is able to go throughout his normal activities. Not a diabetic.  No drainage from the area.  Has not taken any antibiotics.  No other complaints at this time.  HPI     Past Medical History:  Diagnosis Date   Arthritis    Chest pain on exertion    Chronic kidney disease, stage 3a ([facility])    GERD (gastroesophageal reflux disease)    History of Helicobacter pylori infection    in [place]   Hyperlipemia    Hypertension    Hypertensive kidney disease with CKD (chronic kidney disease) stage I    Impaired fasting glucose    Obesity    Radiculopathy, lumbar region     There are no problems to display for this patient.   History reviewed. No pertinent surgical history.     Family History  Problem Relation Age of Onset   Diabetes Mother    Hypertension Mother    Hyperlipidemia Mother    Heart Problems Mother    Heart failure Mother    Hypothyroidism Mother    Hyperlipidemia Father    Heart Problems Father    Angina Father    Asthma Father    Other Father        stomach ulcer   Heart failure Father     Social History   Tobacco Use   Smoking status: Never   Smokeless tobacco: Never  Vaping Use   Vaping Use: Never used  Substance Use Topics   Alcohol use:  Never   Drug use: Never    Home Medications Prior to Admission medications   Medication Sig Start Date End Date Taking? Authorizing Provider  doxycycline (VIBRAMYCIN) 100 MG capsule Take 1 capsule (100 mg total) by mouth 2 (two) times daily for 7 days. 02/17/21 02/24/21 Yes Celestine Prim, PA-C  CYCLOBENZAPRINE HCL PO Take 10 mg by mouth 2 (two) times daily.    [provider]  ibuprofen (ADVIL) 600 MG tablet Take 600 mg by mouth every 6 (six) hours as needed.    [provider]  metoprolol succinate (TOPROL-XL) 25 MG 24 hr tablet Take 12.5 mg by mouth daily.    [provider]  olmesartan-hydrochlorothiazide (BENICAR HCT) 20-12.5 MG tablet Take 1 tablet by mouth daily.    [provider]  omeprazole (PRILOSEC) 40 MG capsule 1 CAP [place] DAILY 04/21/19   [provider]  rosuvastatin (CRESTOR) 20 MG tablet Take 20 mg by mouth daily.    [provider]    Allergies    Patient has no known allergies.  Review of Systems   Review of Systems  Constitutional:  Negative for diaphoresis, fatigue and fever.  Eyes:  Negative for visual disturbance.  Respiratory:  Negative for shortness of breath.   Cardiovascular:  Negative for chest pain.  Gastrointestinal:  Negative for nausea and vomiting.  Musculoskeletal:  Negative for back pain and myalgias.  Skin:  Positive for wound. Negative for color change, pallor and rash.  Neurological:  Negative for syncope, weakness, light-headedness, numbness and headaches.  Psychiatric/Behavioral:  Negative for behavioral problems and confusion.    Physical Exam Updated Vital Signs BP 123/86 (BP Location: Right Arm)   Pulse 71   Temp 98.8 F (37.1 C) (Oral)   Resp 18   Ht 5\' 9"  (1.753 m)   Wt 115.7 kg   SpO2 98%   BMI 37.67 kg/m   Physical Exam Constitutional:      General: He is not in acute distress.    Appearance: Normal appearance. He is not ill-appearing, toxic-appearing or  diaphoretic.  HENT:     Mouth/Throat:     Mouth: Mucous membranes are moist.     Pharynx: Oropharynx is clear.  Eyes:     General: No scleral icterus.    Extraocular Movements: Extraocular movements intact.     Pupils: Pupils are equal, round, and reactive to light.  Cardiovascular:     Rate and Rhythm: Normal rate and regular rhythm.     Pulses: Normal pulses.     Heart sounds: Normal heart sounds.  Pulmonary:     Effort: Pulmonary effort is normal. No respiratory distress.     Breath sounds: Normal breath sounds. No stridor. No wheezing, rhonchi or rales.  Chest:     Chest wall: No tenderness.  Abdominal:     General: Abdomen is flat. There is no distension.     Palpations: Abdomen is soft.     Tenderness: There is no abdominal tenderness. There is no guarding or rebound.  Musculoskeletal:        General: No swelling or tenderness. Normal range of motion.     Cervical back: Normal range of motion and neck supple. No rigidity.     Right lower leg: No edema.     Left lower leg: No edema.  Skin:    General: Skin is warm and dry.     Capillary Refill: Capillary refill takes less than 2 seconds.     Coloration: Skin is not pale.     Comments: Patient with 2 cm area of induration on back.  Very small area of fluctuance.  Erythema surrounding this.  No warmth.  No satellite lesions.  Normal range of motion to back, no subjective numbness.  No midline tenderness.   Neurological:     General: No focal deficit present.     Mental Status: He is alert and oriented to person, place, and time.  Psychiatric:        Mood and Affect: Mood normal.        Behavior: Behavior normal.    ED Results / Procedures / Treatments   Labs (all labs ordered are listed, but only abnormal results are displayed) Labs Reviewed - No data to display  EKG None  Radiology No results found.  Procedures .Marland KitchenIncision and Drainage  Date/Time: 02/17/2021 3:24 PM Performed by: Alfredia Client, PA-C Authorized  by: Alfredia Client, PA-C   Consent:    Consent obtained:  Verbal   Consent given by:  Patient   Risks discussed:  Bleeding, incomplete drainage, pain and damage to other organs   Alternatives discussed:  No treatment Universal protocol:    Procedure explained and questions answered to patient  or proxy's satisfaction: yes     Relevant documents present and verified: yes     Test results available : yes     Imaging studies available: yes     Required blood products, implants, devices, and special equipment available: yes     Site/side marked: yes     Immediately prior to procedure, a time out was called: yes     Patient identity confirmed:  Verbally with patient Location:    Type:  Abscess   Location:  Trunk   Trunk location:  Back Pre-procedure details:    Skin preparation:  Betadine Sedation:    Sedation type:  None Anesthesia:    Anesthesia method:  Local infiltration and topical application   Topical anesthetic:  LET   Local anesthetic:  Lidocaine 1% w/o epi Procedure type:    Complexity:  Complex Procedure details:    Incision types:  Single straight   Incision depth:  Subcutaneous   Wound management:  Probed and deloculated, irrigated with saline and extensive cleaning   Drainage:  Bloody   Drainage amount:  Scant   Packing materials:  None Post-procedure details:    Procedure completion:  Tolerated well, no immediate complications   Medications Ordered in ED Medications  lidocaine (PF) (XYLOCAINE) 1 % injection 5 mL (5 mLs Infiltration Given 02/17/21 1526)  lidocaine-EPINEPHrine-tetracaine (LET) topical gel (3 mLs Topical Given 02/17/21 1605)    ED Course  I have reviewed the triage vital signs and the nursing notes.  Pertinent labs & imaging results that were available during my care of the patient were reviewed by me and considered in my medical decision making (see chart for details).    MDM Rules/Calculators/A&P                         Patient presents to the  emerge department today for abscess on his back.  No midline back pain, no red flag symptoms.  unsure if he was bit by anything.  Patient most likely got bit by a bug, scratched the area and now has an abscess.  Do not see any puncture wounds.  Very small pocket of fluid confirmed by ultrasound.  Area is extremely indurated, risks discussed with patient about I&D, patient wants to proceed.  I&D performed, see procedure note, not much purulence seen on exam.   Will place on antibiotics and patient will follow up in 2 days for recheck.  Patient agreeable with plan.  Patient be discharged at this time.  Doubt need for further emergent work up at this time. I explained the diagnosis and have given explicit precautions to return to the ER including for any other new or worsening symptoms. The patient understands and accepts the medical plan as it's been dictated and I have answered their questions. Discharge instructions concerning home care and prescriptions have been given. The patient is STABLE and is discharged to home in good condition.  Final Clinical Impression(s) / ED Diagnoses Final diagnoses:  Abscess    Rx / DC Orders ED Discharge Orders          Ordered    doxycycline (VIBRAMYCIN) 100 MG capsule  2 times daily        02/17/21 1645             Posey Pronto Claypool, PA-C 02/17/21 1655    Little, Wenda Overland, MD 02/17/21 1710

## 2021-02-17 NOTE — Discharge Instructions (Addendum)
Please take antibiotics as directed.  Do note that doxycycline can cause a rash if in the sun for long periods of time, make sure to wear sunscreen when going out in the sun.  Please cc touch instructions.  I want you to have the wound checked in the next 2 days with your primary care doctor as we discussed.  If you develop any fevers, worsening conditioning, streaking from the area or any other symptoms please come back to the emergency department.  Please take Tylenol directed on bottle for pain.  Please return to the Emergency Department if you experience any worsening of your condition.  Thank you for allowing [place] to be a part of your care. Please speak to your pharmacist about any new medications prescribed today in regards to side effects or interactions with other medications.

## 2021-02-17 NOTE — ED Triage Notes (Signed)
Area of redness and itching to his mid back. He feels it is an insect bite.

## 2021-02-20 DIAGNOSIS — M170 Bilateral primary osteoarthritis of knee: Secondary | ICD-10-CM | POA: Diagnosis not present

## 2021-02-22 DIAGNOSIS — M9904 Segmental and somatic dysfunction of sacral region: Secondary | ICD-10-CM | POA: Diagnosis not present

## 2021-02-22 DIAGNOSIS — M9903 Segmental and somatic dysfunction of lumbar region: Secondary | ICD-10-CM | POA: Diagnosis not present

## 2021-02-22 DIAGNOSIS — M9905 Segmental and somatic dysfunction of pelvic region: Secondary | ICD-10-CM | POA: Diagnosis not present

## 2021-02-22 DIAGNOSIS — M5136 Other intervertebral disc degeneration, lumbar region: Secondary | ICD-10-CM | POA: Diagnosis not present

## 2021-02-27 ENCOUNTER — Ambulatory Visit: Payer: Medicare Other | Admitting: Cardiology

## 2021-02-27 NOTE — Progress Notes (Deleted)
Cardiology Office Note:    Date:  02/27/2021   ID:  Joseph Compton, DOB July 07, 1956, MRN [id number]  PCP:  Burnard Bunting, MD   [place]  Cardiologist:  [name], MD  Advanced Practice Provider:  No care team member to display Electrophysiologist:  None    Referring MD: Burnard Bunting, MD     History of Present Illness:    Joseph Compton is a 65 y.o. male with a hx of HTN, HLD, GERD and family history of CAD who presents to clinic for CV follow-up.  Was last seen on 11/01/20 where he was having chest burning that was worse with meals/laying down. No exertional symptoms. Symptoms thought to be noncardic in nature. Recommended Ca score, however, this has not been performed.  Today,   Past Medical History:  Diagnosis Date   Arthritis    Chest pain on exertion    Chronic kidney disease, stage 3a (HCC)    GERD (gastroesophageal reflux disease)    History of Helicobacter pylori infection    in [place]   Hyperlipemia    Hypertension    Hypertensive kidney disease with CKD (chronic kidney disease) stage I    Impaired fasting glucose    Obesity    Radiculopathy, lumbar region     No past surgical history on file.  Current Medications: No outpatient medications have been marked as taking for the 02/28/21 encounter (Appointment) with [name], MD.     Allergies:   Patient has no known allergies.   Social History   Socioeconomic History   Marital status: Married    Spouse name: Not on file   Number of children: Not on file   Years of education: Not on file   Highest education level: Not on file  Occupational History   Not on file  Tobacco Use   Smoking status: Never   Smokeless tobacco: Never  Vaping Use   Vaping Use: Never used  Substance and Sexual Activity   Alcohol use: Never   Drug use: Never   Sexual activity: Yes  Other Topics Concern   Not on file  Social History Narrative   Not on file    Social Determinants of Health   Financial Resource Strain: Not on file  Food Insecurity: Not on file  Transportation Needs: Not on file  Physical Activity: Not on file  Stress: Not on file  Social Connections: Not on file     Family History: The patient's family history includes Angina in his father; Asthma in his father; Diabetes in his mother; Heart Problems in his father and mother; Heart failure in his father and mother; Hyperlipidemia in his father and mother; Hypertension in his mother; Hypothyroidism in his mother; Other in his father.  ROS:   Please see the history of present illness.    Review of Systems  Constitutional:  Negative for chills, diaphoresis and fever.  HENT:  Negative for hearing loss.   Eyes:  Negative for blurred vision and redness.  Respiratory:  Negative for shortness of breath.   Cardiovascular:  Positive for chest pain, palpitations and leg swelling. Negative for orthopnea and claudication.  Gastrointestinal:  Positive for heartburn. Negative for melena, nausea and vomiting.  Genitourinary:  Negative for hematuria.  Musculoskeletal:  Negative for falls and myalgias.  Neurological:  Negative for dizziness and loss of consciousness.  Endo/Heme/Allergies:  Negative for polydipsia.  Psychiatric/Behavioral:  Negative for substance abuse.    EKGs/Labs/Other Studies Reviewed:  The following studies were reviewed today: No cardiac studies in our system  EKG:  Unable to obtain today as leads would not stick to his chest and no razor available to shave.  Recent Labs: No results found for requested labs within last 8760 hours.  Recent Lipid Panel No results found for: CHOL, TRIG, HDL, CHOLHDL, VLDL, LDLCALC, LDLDIRECT   Physical Exam:    VS:  There were no vitals taken for this visit.    Wt Readings from Last 3 Encounters:  02/17/21 255 lb 1.2 oz (115.7 kg)  12/05/20 255 lb (115.7 kg)  11/01/20 270 lb 9.6 oz (122.7 kg)     GEN:  Well  nourished, well developed in no acute distress HEENT: Normal NECK: No JVD; No carotid bruits CARDIAC: RRR, no murmurs, rubs, gallops RESPIRATORY:  Clear to auscultation without rales, wheezing or rhonchi  ABDOMEN: Soft, non-tender, non-distended MUSCULOSKELETAL:  No edema; No deformity  SKIN: Warm and dry NEUROLOGIC:  Alert and oriented x 3 PSYCHIATRIC:  Normal affect   ASSESSMENT:    No diagnosis found.  PLAN:    In order of problems listed above:  #Non-Cardiac Chest Pain: #GERD: Patient with substernal chest burning and pressure that occurs after eating a heavy meal especially when he lays down after a heavy meal. No exertional symptoms. Able to bike 53min every other day and walk 2-3 miles 3-4x/week with his dog without issues. Notably has stopped his omeprazole. Discussed that his symptoms sound very consistent with GERD and he should resume his omeprazole, watch his meal sizes and continue his weight loss efforts.  #HTN: Well controlled at home. -Continue metop succinate 25mg  XL daily -Continue olmesartan-HCTZ 20-12.5mg   #HLD: #Family history of CAD: -Continue crestor 20mg  daily -Check coronary calcium score for risk factor modification  #Suspected OSA: Patient with episodes of snoring and wakes up frequently in the middle of the night. Concerned he may have OSA. -Check sleep study  Medication Adjustments/Labs and Tests Ordered: Current medicines are reviewed at length with the patient today.  Concerns regarding medicines are outlined above.  No orders of the defined types were placed in this encounter.  No orders of the defined types were placed in this encounter.   There are no Patient Instructions on file for this visit.    Signed, [name], MD  02/27/2021 8:07 PM    [place]

## 2021-02-28 ENCOUNTER — Ambulatory Visit: Payer: Medicare Other | Admitting: Cardiology

## 2021-03-06 DIAGNOSIS — M5136 Other intervertebral disc degeneration, lumbar region: Secondary | ICD-10-CM | POA: Diagnosis not present

## 2021-03-06 DIAGNOSIS — M9905 Segmental and somatic dysfunction of pelvic region: Secondary | ICD-10-CM | POA: Diagnosis not present

## 2021-03-06 DIAGNOSIS — M9903 Segmental and somatic dysfunction of lumbar region: Secondary | ICD-10-CM | POA: Diagnosis not present

## 2021-03-06 DIAGNOSIS — M9904 Segmental and somatic dysfunction of sacral region: Secondary | ICD-10-CM | POA: Diagnosis not present

## 2021-03-08 DIAGNOSIS — M9905 Segmental and somatic dysfunction of pelvic region: Secondary | ICD-10-CM | POA: Diagnosis not present

## 2021-03-08 DIAGNOSIS — M9903 Segmental and somatic dysfunction of lumbar region: Secondary | ICD-10-CM | POA: Diagnosis not present

## 2021-03-08 DIAGNOSIS — M5136 Other intervertebral disc degeneration, lumbar region: Secondary | ICD-10-CM | POA: Diagnosis not present

## 2021-03-08 DIAGNOSIS — M9904 Segmental and somatic dysfunction of sacral region: Secondary | ICD-10-CM | POA: Diagnosis not present

## 2021-03-20 DIAGNOSIS — M5136 Other intervertebral disc degeneration, lumbar region: Secondary | ICD-10-CM | POA: Diagnosis not present

## 2021-03-20 DIAGNOSIS — M9905 Segmental and somatic dysfunction of pelvic region: Secondary | ICD-10-CM | POA: Diagnosis not present

## 2021-03-20 DIAGNOSIS — M9904 Segmental and somatic dysfunction of sacral region: Secondary | ICD-10-CM | POA: Diagnosis not present

## 2021-03-20 DIAGNOSIS — M5459 Other low back pain: Secondary | ICD-10-CM | POA: Diagnosis not present

## 2021-03-20 DIAGNOSIS — M5432 Sciatica, left side: Secondary | ICD-10-CM | POA: Diagnosis not present

## 2021-03-20 DIAGNOSIS — M9903 Segmental and somatic dysfunction of lumbar region: Secondary | ICD-10-CM | POA: Diagnosis not present

## 2021-03-20 DIAGNOSIS — M545 Low back pain, unspecified: Secondary | ICD-10-CM | POA: Diagnosis not present

## 2021-03-24 DIAGNOSIS — M5459 Other low back pain: Secondary | ICD-10-CM | POA: Diagnosis not present

## 2021-03-24 DIAGNOSIS — M545 Low back pain, unspecified: Secondary | ICD-10-CM | POA: Diagnosis not present

## 2021-03-29 DIAGNOSIS — L720 Epidermal cyst: Secondary | ICD-10-CM | POA: Diagnosis not present

## 2021-03-30 DIAGNOSIS — M9904 Segmental and somatic dysfunction of sacral region: Secondary | ICD-10-CM | POA: Diagnosis not present

## 2021-03-30 DIAGNOSIS — M5136 Other intervertebral disc degeneration, lumbar region: Secondary | ICD-10-CM | POA: Diagnosis not present

## 2021-03-30 DIAGNOSIS — M545 Low back pain, unspecified: Secondary | ICD-10-CM | POA: Diagnosis not present

## 2021-03-30 DIAGNOSIS — M9905 Segmental and somatic dysfunction of pelvic region: Secondary | ICD-10-CM | POA: Diagnosis not present

## 2021-03-30 DIAGNOSIS — M9903 Segmental and somatic dysfunction of lumbar region: Secondary | ICD-10-CM | POA: Diagnosis not present

## 2021-04-03 ENCOUNTER — Ambulatory Visit (HOSPITAL_BASED_OUTPATIENT_CLINIC_OR_DEPARTMENT_OTHER): Payer: Medicare Other | Admitting: Family

## 2021-04-05 DIAGNOSIS — M5416 Radiculopathy, lumbar region: Secondary | ICD-10-CM | POA: Insufficient documentation

## 2021-04-06 DIAGNOSIS — M5136 Other intervertebral disc degeneration, lumbar region: Secondary | ICD-10-CM | POA: Diagnosis not present

## 2021-04-06 DIAGNOSIS — M9904 Segmental and somatic dysfunction of sacral region: Secondary | ICD-10-CM | POA: Diagnosis not present

## 2021-04-06 DIAGNOSIS — M9903 Segmental and somatic dysfunction of lumbar region: Secondary | ICD-10-CM | POA: Diagnosis not present

## 2021-04-06 DIAGNOSIS — M9905 Segmental and somatic dysfunction of pelvic region: Secondary | ICD-10-CM | POA: Diagnosis not present

## 2021-04-13 DIAGNOSIS — M5416 Radiculopathy, lumbar region: Secondary | ICD-10-CM | POA: Diagnosis not present

## 2021-04-25 DIAGNOSIS — M9905 Segmental and somatic dysfunction of pelvic region: Secondary | ICD-10-CM | POA: Diagnosis not present

## 2021-04-25 DIAGNOSIS — M5136 Other intervertebral disc degeneration, lumbar region: Secondary | ICD-10-CM | POA: Diagnosis not present

## 2021-04-25 DIAGNOSIS — M9903 Segmental and somatic dysfunction of lumbar region: Secondary | ICD-10-CM | POA: Diagnosis not present

## 2021-04-25 DIAGNOSIS — M9904 Segmental and somatic dysfunction of sacral region: Secondary | ICD-10-CM | POA: Diagnosis not present

## 2021-04-26 DIAGNOSIS — M9903 Segmental and somatic dysfunction of lumbar region: Secondary | ICD-10-CM | POA: Diagnosis not present

## 2021-04-26 DIAGNOSIS — M5136 Other intervertebral disc degeneration, lumbar region: Secondary | ICD-10-CM | POA: Diagnosis not present

## 2021-04-26 DIAGNOSIS — M9905 Segmental and somatic dysfunction of pelvic region: Secondary | ICD-10-CM | POA: Diagnosis not present

## 2021-04-26 DIAGNOSIS — M9904 Segmental and somatic dysfunction of sacral region: Secondary | ICD-10-CM | POA: Diagnosis not present

## 2021-05-04 DIAGNOSIS — M545 Low back pain, unspecified: Secondary | ICD-10-CM | POA: Diagnosis not present

## 2021-05-10 DIAGNOSIS — N1831 Chronic kidney disease, stage 3a: Secondary | ICD-10-CM | POA: Diagnosis not present

## 2021-05-10 DIAGNOSIS — I129 Hypertensive chronic kidney disease with stage 1 through stage 4 chronic kidney disease, or unspecified chronic kidney disease: Secondary | ICD-10-CM | POA: Diagnosis not present

## 2021-05-10 DIAGNOSIS — R7301 Impaired fasting glucose: Secondary | ICD-10-CM | POA: Diagnosis not present

## 2021-05-10 DIAGNOSIS — M5416 Radiculopathy, lumbar region: Secondary | ICD-10-CM | POA: Diagnosis not present

## 2021-05-16 DIAGNOSIS — M5116 Intervertebral disc disorders with radiculopathy, lumbar region: Secondary | ICD-10-CM | POA: Diagnosis not present

## 2021-05-25 ENCOUNTER — Telehealth: Payer: Self-pay | Admitting: *Deleted

## 2021-05-25 NOTE — Telephone Encounter (Signed)
Primary Cardiologist:Heather Renae Fickle, MD  Chart reviewed as part of pre-operative protocol coverage. Because of Joseph Compton past medical history and time since last visit, he/she will require a follow-up visit in order to better assess preoperative cardiovascular risk.  Pre-op covering staff: - Please schedule appointment and call patient to inform them. - Please contact requesting surgeon's office via preferred method (i.e, phone, fax) to inform them of need for appointment prior to surgery.  If applicable, this message will also be routed to pharmacy pool and/or primary cardiologist for input on holding anticoagulant/antiplatelet agent as requested below so that this information is available at time of patient's appointment.   Deberah Pelton, NP  05/25/2021, 2:12 PM

## 2021-05-25 NOTE — Telephone Encounter (Signed)
   [facility] Pre-operative Risk Assessment    Patient Name: Joseph Compton  DOB: November 28, 1955 MRN: [id number]  HEARTCARE STAFF:  - IMPORTANT!!!!!! Under Visit Info/Reason for Call, type in Other and utilize the format Clearance MM/DD/YY or Clearance TBD. Do not use dashes or single digits. - Please review there is not already an duplicate clearance open for this procedure. - If request is for dental extraction, please clarify the # of teeth to be extracted. - If the patient is currently at the dentist's office, call Pre-Op Callback Staff (MA/nurse) to input urgent request.  - If the patient is not currently in the dentist office, please route to the Pre-Op pool.  Request for surgical clearance:  What type of surgery is being performed? Microdiscectomy, Lumbar  When is this surgery scheduled? 07/10/21   What type of clearance is required (medical clearance vs. Pharmacy clearance to hold med vs. Both)? Medical  Are there any medications that need to be held prior to surgery and how long? None listed  Practice name and name of physician performing surgery? [facility], Dr Newman Pies  What is the office phone number? [phone] ext [phone]   7.   What is the office fax number? [phone]  8.   Anesthesia type (None, local, MAC, general) ? General   Sharise Lippy L 05/25/2021, 1:49 PM  _________________________________________________________________   (provider comments below)

## 2021-05-25 NOTE — Telephone Encounter (Signed)
Patient has a pre-op appointment scheduled on 09/03/20 at 2:30 PM with Laurann Montana. Will route back to requesting surgeons office to make them aware.

## 2021-06-14 DIAGNOSIS — M1712 Unilateral primary osteoarthritis, left knee: Secondary | ICD-10-CM | POA: Diagnosis not present

## 2021-06-14 DIAGNOSIS — M1711 Unilateral primary osteoarthritis, right knee: Secondary | ICD-10-CM | POA: Diagnosis not present

## 2021-07-04 ENCOUNTER — Ambulatory Visit (INDEPENDENT_AMBULATORY_CARE_PROVIDER_SITE_OTHER): Payer: Medicare Other | Admitting: Nurse Practitioner

## 2021-07-04 ENCOUNTER — Other Ambulatory Visit: Payer: Self-pay

## 2021-07-04 ENCOUNTER — Encounter (HOSPITAL_BASED_OUTPATIENT_CLINIC_OR_DEPARTMENT_OTHER): Payer: Self-pay | Admitting: Nurse Practitioner

## 2021-07-04 ENCOUNTER — Telehealth: Payer: Self-pay

## 2021-07-04 VITALS — HR 64

## 2021-07-04 DIAGNOSIS — I10 Essential (primary) hypertension: Secondary | ICD-10-CM

## 2021-07-04 DIAGNOSIS — R079 Chest pain, unspecified: Secondary | ICD-10-CM | POA: Diagnosis not present

## 2021-07-04 DIAGNOSIS — K2100 Gastro-esophageal reflux disease with esophagitis, without bleeding: Secondary | ICD-10-CM | POA: Diagnosis not present

## 2021-07-04 DIAGNOSIS — R400 Somnolence: Secondary | ICD-10-CM | POA: Diagnosis not present

## 2021-07-04 DIAGNOSIS — E785 Hyperlipidemia, unspecified: Secondary | ICD-10-CM

## 2021-07-04 NOTE — Patient Instructions (Signed)
Medication Instructions:  Your Physician recommend you continue on your current medication as directed.    *If you need a refill on your cardiac medications before your next appointment, please call your pharmacy*   Lab Work: None ordered today   Testing/Procedures: Please Schedule your Calcium Scoring test   Your physician has recommended that you have a sleep study. This test records several body functions during sleep, including: brain activity, eye movement, oxygen and carbon dioxide blood levels, heart rate and rhythm, breathing rate and rhythm, the flow of air through your mouth and nose, snoring, body muscle movements, and chest and belly movement.    Follow-Up: At [facility], you and your health needs are our priority.  As part of our continuing mission to provide you with exceptional heart care, we have created designated Provider Care Teams.  These Care Teams include your primary Cardiologist (physician) and Advanced Practice Providers (APPs -  Physician Assistants and Nurse Practitioners) who all work together to provide you with the care you need, when you need it.  We recommend signing up for the patient portal called "MyChart".  Sign up information is provided on this After Visit Summary.  MyChart is used to connect with patients for Virtual Visits (Telemedicine).  Patients are able to view lab/test results, encounter notes, upcoming appointments, etc.  Non-urgent messages can be sent to your provider as well.   To learn more about what you can do with MyChart, go to [email or web address].    Your next appointment:   April 2023   The format for your next appointment:   In Person  Provider:   [name], MD     Other Instructions

## 2021-07-04 NOTE — Telephone Encounter (Signed)
Letter has been sent to patient informing them that their sleep study has expired. Patient will need to call and schedule an office visit to re-evaluate the need for a sleep study.    

## 2021-07-04 NOTE — Progress Notes (Signed)
Cardiology Office Note:    Date:  07/04/2021   ID:  Joseph Compton, DOB [birth year], MRN [id number]  PCP:  Burnard Bunting, MD   [facility] Providers Cardiologist:  [name], MD   Referring MD: Burnard Bunting, MD   Chief Complaint: surgical clearance for lumbar microdiscectomy   History of Present Illness:    Joseph Compton is a 65 y.o. male with a hx of HTN, HLD, GERD, and family history of early CAD. He established care with our office on 11/01/20 with Dr. [name]. He moved to [facility] in 2018 and was referred to cardiology by his PCP, Dr. Reynaldo Minium for evaluation of chest discomfort.    Today, he presents for follow-up of cardiac issues. He was originally scheduled for pre-op clearance today but he has decided to cancel his surgery due to improvement in symptoms of sciatica. He reports he did not complete the testing ordered by Dr. [name] and he would like to get those tests scheduled. Reports no shortness of breath nor dyspnea on exertion. Has occasional "sharp" pains in right and left chest that last for a few seconds and resolve on their own. Tends to occur more often at night after eating a meal. Has hx of GERD but is not taking PPI or antacid. Reports no chest tightness or pressure with exertion. No orthopnea, PND. Bilateral lower extremity edema that he reports is chronic for him. Has palpitations when he lays down at night approximately once per month. Rides his stationary bike 4-8 miles daily without chest pain. Reports 30 lb weight gain since he started having back problems. He was 282 lb today and was 270 lb on 11/01/20.   Past Medical History:  Diagnosis Date   Arthritis    Chest pain on exertion    Chronic kidney disease, stage 3a (HCC)    GERD (gastroesophageal reflux disease)    History of Helicobacter pylori infection    in [place]   Hyperlipemia    Hypertension    Hypertensive kidney disease with CKD (chronic kidney disease) stage I    Impaired  fasting glucose    Obesity    Radiculopathy, lumbar region     History reviewed. No pertinent surgical history.  Current Medications: Current Meds  Medication Sig   CYCLOBENZAPRINE HCL PO Take 10 mg by mouth 2 (two) times daily.   ibuprofen (ADVIL) 600 MG tablet Take 600 mg by mouth every 6 (six) hours as needed.   metoprolol succinate (TOPROL-XL) 50 MG 24 hr tablet Take 25 mg by mouth daily.   olmesartan-hydrochlorothiazide (BENICAR HCT) 20-12.5 MG tablet Take 1 tablet by mouth daily.   omeprazole (PRILOSEC) 40 MG capsule 1 CAP 30MINS BEFORE BREAKFAST DAILY   rosuvastatin (CRESTOR) 20 MG tablet Take 20 mg by mouth daily.     Allergies:   Patient has no known allergies.   Social History   Socioeconomic History   Marital status: Married    Spouse name: Not on file   Number of children: Not on file   Years of education: Not on file   Highest education level: Not on file  Occupational History   Not on file  Tobacco Use   Smoking status: Never   Smokeless tobacco: Never  Vaping Use   Vaping Use: Never used  Substance and Sexual Activity   Alcohol use: Never   Drug use: Never   Sexual activity: Yes  Other Topics Concern   Not on file  Social History Narrative   Not on file  Social Determinants of Health   Financial Resource Strain: Not on file  Food Insecurity: Not on file  Transportation Needs: Not on file  Physical Activity: Not on file  Stress: Not on file  Social Connections: Not on file     Family History: The patient's family history includes Angina in his father; Asthma in his father; Diabetes in his mother; Heart Problems in his father and mother; Heart failure in his father and mother; Hyperlipidemia in his father and mother; Hypertension in his mother; Hypothyroidism in his mother; Other in his father.  ROS:  ++bilateral lower extremity edema. Please see the history of present illness.  All other systems reviewed and are negative.  Labs/Other Studies  Reviewed:    The following studies were reviewed today:  Venous US   IMPRESSION: 1. No evidence of DVT. 2. Approximately 7.2 cm complex cyst within the left popliteal fossa, nonspecific but most likely a Baker's cyst.  Recent Labs: [facility] 10/13/20 A1C 5.6, creat 1.7, TSH 2.65   Recent Lipid Panel [facility] 10/13/20 LDL 78, HDL 52, Trigs 137     Physical Exam:    VS:  BP (P) 112/72 (BP Location: Left Arm, Patient Position: Sitting, Cuff Size: Large)   Pulse 64   Ht (P) '5\' 9"'  (1.753 m)   Wt (P) 282 lb 11.2 oz (128.2 kg)   BMI (P) 41.75 kg/m     Wt Readings from Last 3 Encounters:  07/04/21 (P) 282 lb 11.2 oz (128.2 kg)  02/17/21 255 lb 1.2 oz (115.7 kg)  12/05/20 255 lb (115.7 kg)     GEN:  Well nourished, well developed in no acute distress HEENT: Normal NECK: No JVD; No carotid bruits LYMPHATICS: No lymphadenopathy CARDIAC: RRR, no murmurs, rubs, gallops RESPIRATORY:  Clear to auscultation without rales, wheezing or rhonchi  ABDOMEN: Soft, non-tender, non-distended MUSCULOSKELETAL:  Bilateral lower extremity edema; No deformity. 2+ pedal pulses bilaterally. SKIN: Warm and dry NEUROLOGIC:  Alert and oriented x 3 PSYCHIATRIC:  Normal affect   EKG:  EKG is  ordered today.  The ekg ordered today demonstrates NSR at rate of 64 bpm. There is a great deal of artifact due to hair on chest that was not able to be removed with our razor. No previous ekg for comparison.   Diagnoses:    1. Daytime sleepiness   2. Hyperlipidemia, unspecified hyperlipidemia type   3. Chest pain of uncertain etiology   4. Essential hypertension   5. Gastroesophageal reflux disease with esophagitis without hemorrhage    Assessment and Plan:    Daytime sleepiness/OSA: Reports that he snores and has daytime sleepiness. Wakes up on occasion during the night feeling startled. Sleep study was recommended by Dr. [name] in March 2022      but not completed. He is interested in the [facility] home sleep test.      StopBng score is 6. Will send him home with test kit today and give him instructions on getting it completed once approved by insurance.   Essential hypertension: BP is well-controlled. He does not monitor it regularly. Basic metabolic panel that we have on file is from 10/08/19 and shows creatinine of 1.7, BUN 32. He is not aware of a repeat blood test. Discussed indication for repeat blood work and he repeatedly declined. Says he will follow-up with PCP in March 2023.   Chest pain: Sharp pain in right and left chest that occurs most consistently at night after a meal. Occasionally feels palpitations when laying down. Has  a history of GERD but is not taking omeprazole as recommended. Recommended that he resume omeprazole or try an OTC antacid. Encouraged him to continue exercise and work on weight loss.   Hyperlipidemia: LDL 78 on 10/13/20. He would like to proceed with getting a calcium score. Will use for risk stratification. Continue Crestor 20 mg.   GERD: As noted above, patient has sharp chest pain that occurs after nighttime meals. Encouraged him to continue to work on weight loss and resume omeprazole which was prescribed in the past for symptoms.    Medication Adjustments/Labs and Tests Ordered: Current medicines are reviewed at length with the patient today.  Concerns regarding medicines are outlined above.  Orders Placed This Encounter  Procedures   EKG 12-Lead   Itamar Sleep Study    No orders of the defined types were placed in this encounter.   Patient Instructions  Medication Instructions:  Your Physician recommend you continue on your current medication as directed.    *If you need a refill on your cardiac medications before your next appointment, please call your pharmacy*   Lab Work: None ordered today   Testing/Procedures: Please Schedule your Calcium Scoring test   Your physician has recommended that you have a sleep study. This test records several body functions  during sleep, including: brain activity, eye movement, oxygen and carbon dioxide blood levels, heart rate and rhythm, breathing rate and rhythm, the flow of air through your mouth and nose, snoring, body muscle movements, and chest and belly movement.    Follow-Up: At [facility], you and your health needs are our priority.  As part of our continuing mission to provide you with exceptional heart care, we have created designated Provider Care Teams.  These Care Teams include your primary Cardiologist (physician) and Advanced Practice Providers (APPs -  Physician Assistants and Nurse Practitioners) who all work together to provide you with the care you need, when you need it.  We recommend signing up for the patient portal called "MyChart".  Sign up information is provided on this After Visit Summary.  MyChart is used to connect with patients for Virtual Visits (Telemedicine).  Patients are able to view lab/test results, encounter notes, upcoming appointments, etc.  Non-urgent messages can be sent to your provider as well.   To learn more about what you can do with MyChart, go to [email or web address].    Your next appointment:   April 2023   The format for your next appointment:   In Person  Provider:   [name], MD     Other Instructions   Signed, Emmaline Life, NP  07/04/2021 4:51 PM    [place]

## 2021-07-10 ENCOUNTER — Telehealth (HOSPITAL_BASED_OUTPATIENT_CLINIC_OR_DEPARTMENT_OTHER): Payer: Self-pay

## 2021-07-10 ENCOUNTER — Encounter (HOSPITAL_BASED_OUTPATIENT_CLINIC_OR_DEPARTMENT_OTHER): Payer: Self-pay

## 2021-07-10 NOTE — Telephone Encounter (Signed)
Left message on voicemail that pt. Is okay to start sleep study

## 2021-07-10 NOTE — Telephone Encounter (Signed)
-----   Message from Lauralee Evener, [place] sent at 07/10/2021 12:46 PM EST ----- Patient has Medicare and does not require a PA. ----- Message ----- From: Gerald Stabs, RN Sent: 07/04/2021   4:09 PM EST To: Cv Div Sleep Studies  Itamar sleep study ordered, please let me know when he is approved

## 2021-07-12 DIAGNOSIS — M9903 Segmental and somatic dysfunction of lumbar region: Secondary | ICD-10-CM | POA: Diagnosis not present

## 2021-07-12 DIAGNOSIS — M5136 Other intervertebral disc degeneration, lumbar region: Secondary | ICD-10-CM | POA: Diagnosis not present

## 2021-07-12 DIAGNOSIS — M9904 Segmental and somatic dysfunction of sacral region: Secondary | ICD-10-CM | POA: Diagnosis not present

## 2021-07-12 DIAGNOSIS — M9905 Segmental and somatic dysfunction of pelvic region: Secondary | ICD-10-CM | POA: Diagnosis not present

## 2021-07-20 ENCOUNTER — Encounter (HOSPITAL_BASED_OUTPATIENT_CLINIC_OR_DEPARTMENT_OTHER): Payer: Self-pay

## 2021-07-25 ENCOUNTER — Encounter (HOSPITAL_BASED_OUTPATIENT_CLINIC_OR_DEPARTMENT_OTHER): Payer: Self-pay

## 2021-07-25 NOTE — Progress Notes (Signed)
Resent letter about at home sleep study!

## 2021-08-04 ENCOUNTER — Telehealth (HOSPITAL_BASED_OUTPATIENT_CLINIC_OR_DEPARTMENT_OTHER): Payer: Self-pay

## 2021-08-04 DIAGNOSIS — R400 Somnolence: Secondary | ICD-10-CM

## 2021-08-04 NOTE — Telephone Encounter (Signed)
Attempted to call patient regarding his sleep study. Pt. Was approved on 07-04-21 for completing sleep study and we have attempted to reach out to him via phone, mail and mychart to get him to complete the study.    Left voicemail with instructions to complete the study or return it in two weeks or the patient information will be sent to billing.

## 2021-08-09 ENCOUNTER — Telehealth: Payer: Self-pay | Admitting: Cardiology

## 2021-08-09 DIAGNOSIS — M9905 Segmental and somatic dysfunction of pelvic region: Secondary | ICD-10-CM | POA: Diagnosis not present

## 2021-08-09 DIAGNOSIS — M9903 Segmental and somatic dysfunction of lumbar region: Secondary | ICD-10-CM | POA: Diagnosis not present

## 2021-08-09 DIAGNOSIS — M5136 Other intervertebral disc degeneration, lumbar region: Secondary | ICD-10-CM | POA: Diagnosis not present

## 2021-08-09 DIAGNOSIS — M9904 Segmental and somatic dysfunction of sacral region: Secondary | ICD-10-CM | POA: Diagnosis not present

## 2021-08-09 NOTE — Telephone Encounter (Signed)
Spoke with patient and gave him office hours so that he may come and return his sleep study device since he was unable to use!

## 2021-08-09 NOTE — Telephone Encounter (Signed)
Patient calling in to get a sleep study done at the hospital because he is unable to do it at home. He would like to drop the home monitor back off at our office. Please advise

## 2021-08-16 DIAGNOSIS — M5136 Other intervertebral disc degeneration, lumbar region: Secondary | ICD-10-CM | POA: Diagnosis not present

## 2021-08-16 DIAGNOSIS — M9903 Segmental and somatic dysfunction of lumbar region: Secondary | ICD-10-CM | POA: Diagnosis not present

## 2021-08-16 DIAGNOSIS — M9904 Segmental and somatic dysfunction of sacral region: Secondary | ICD-10-CM | POA: Diagnosis not present

## 2021-08-16 DIAGNOSIS — M9905 Segmental and somatic dysfunction of pelvic region: Secondary | ICD-10-CM | POA: Diagnosis not present

## 2021-08-18 ENCOUNTER — Telehealth (HOSPITAL_BASED_OUTPATIENT_CLINIC_OR_DEPARTMENT_OTHER): Payer: Self-pay

## 2021-08-18 NOTE — Telephone Encounter (Signed)
Attempted to call patient one last time in regards to returning [facility] sleep study. Once again no answer. Patient information sent to billing!

## 2021-08-21 DIAGNOSIS — M9904 Segmental and somatic dysfunction of sacral region: Secondary | ICD-10-CM | POA: Diagnosis not present

## 2021-08-21 DIAGNOSIS — M9905 Segmental and somatic dysfunction of pelvic region: Secondary | ICD-10-CM | POA: Diagnosis not present

## 2021-08-21 DIAGNOSIS — M9903 Segmental and somatic dysfunction of lumbar region: Secondary | ICD-10-CM | POA: Diagnosis not present

## 2021-08-21 DIAGNOSIS — M5136 Other intervertebral disc degeneration, lumbar region: Secondary | ICD-10-CM | POA: Diagnosis not present

## 2021-08-24 DIAGNOSIS — M9904 Segmental and somatic dysfunction of sacral region: Secondary | ICD-10-CM | POA: Diagnosis not present

## 2021-08-24 DIAGNOSIS — M9905 Segmental and somatic dysfunction of pelvic region: Secondary | ICD-10-CM | POA: Diagnosis not present

## 2021-08-24 DIAGNOSIS — M5136 Other intervertebral disc degeneration, lumbar region: Secondary | ICD-10-CM | POA: Diagnosis not present

## 2021-08-24 DIAGNOSIS — M9903 Segmental and somatic dysfunction of lumbar region: Secondary | ICD-10-CM | POA: Diagnosis not present

## 2021-08-28 DIAGNOSIS — M9904 Segmental and somatic dysfunction of sacral region: Secondary | ICD-10-CM | POA: Diagnosis not present

## 2021-08-28 DIAGNOSIS — M5136 Other intervertebral disc degeneration, lumbar region: Secondary | ICD-10-CM | POA: Diagnosis not present

## 2021-08-28 DIAGNOSIS — M9905 Segmental and somatic dysfunction of pelvic region: Secondary | ICD-10-CM | POA: Diagnosis not present

## 2021-08-28 DIAGNOSIS — M9903 Segmental and somatic dysfunction of lumbar region: Secondary | ICD-10-CM | POA: Diagnosis not present

## 2021-08-30 DIAGNOSIS — M9904 Segmental and somatic dysfunction of sacral region: Secondary | ICD-10-CM | POA: Diagnosis not present

## 2021-08-30 DIAGNOSIS — M9905 Segmental and somatic dysfunction of pelvic region: Secondary | ICD-10-CM | POA: Diagnosis not present

## 2021-08-30 DIAGNOSIS — M9903 Segmental and somatic dysfunction of lumbar region: Secondary | ICD-10-CM | POA: Diagnosis not present

## 2021-08-30 DIAGNOSIS — M5136 Other intervertebral disc degeneration, lumbar region: Secondary | ICD-10-CM | POA: Diagnosis not present

## 2021-09-04 DIAGNOSIS — M9903 Segmental and somatic dysfunction of lumbar region: Secondary | ICD-10-CM | POA: Diagnosis not present

## 2021-09-04 DIAGNOSIS — M9905 Segmental and somatic dysfunction of pelvic region: Secondary | ICD-10-CM | POA: Diagnosis not present

## 2021-09-04 DIAGNOSIS — M5136 Other intervertebral disc degeneration, lumbar region: Secondary | ICD-10-CM | POA: Diagnosis not present

## 2021-09-04 DIAGNOSIS — M9904 Segmental and somatic dysfunction of sacral region: Secondary | ICD-10-CM | POA: Diagnosis not present

## 2021-09-05 DIAGNOSIS — M9905 Segmental and somatic dysfunction of pelvic region: Secondary | ICD-10-CM | POA: Diagnosis not present

## 2021-09-05 DIAGNOSIS — M5136 Other intervertebral disc degeneration, lumbar region: Secondary | ICD-10-CM | POA: Diagnosis not present

## 2021-09-05 DIAGNOSIS — M9903 Segmental and somatic dysfunction of lumbar region: Secondary | ICD-10-CM | POA: Diagnosis not present

## 2021-09-05 DIAGNOSIS — M9904 Segmental and somatic dysfunction of sacral region: Secondary | ICD-10-CM | POA: Diagnosis not present

## 2021-09-06 ENCOUNTER — Telehealth (HOSPITAL_BASED_OUTPATIENT_CLINIC_OR_DEPARTMENT_OTHER): Payer: Self-pay | Admitting: Cardiology

## 2021-09-06 DIAGNOSIS — R079 Chest pain, unspecified: Secondary | ICD-10-CM

## 2021-09-06 DIAGNOSIS — E782 Mixed hyperlipidemia: Secondary | ICD-10-CM

## 2021-09-06 DIAGNOSIS — M9904 Segmental and somatic dysfunction of sacral region: Secondary | ICD-10-CM | POA: Diagnosis not present

## 2021-09-06 DIAGNOSIS — M5136 Other intervertebral disc degeneration, lumbar region: Secondary | ICD-10-CM | POA: Diagnosis not present

## 2021-09-06 DIAGNOSIS — M9903 Segmental and somatic dysfunction of lumbar region: Secondary | ICD-10-CM | POA: Diagnosis not present

## 2021-09-06 DIAGNOSIS — E785 Hyperlipidemia, unspecified: Secondary | ICD-10-CM

## 2021-09-06 DIAGNOSIS — M9905 Segmental and somatic dysfunction of pelvic region: Secondary | ICD-10-CM | POA: Diagnosis not present

## 2021-09-06 NOTE — Telephone Encounter (Signed)
New order for Calcium Score placed and will send back to [facility] scheduler to call the pt and arrange.

## 2021-09-06 NOTE — Telephone Encounter (Signed)
Pt dropped off sleep study at the [facility] location. I have asked the nurse if she could get the device back to [facility]. Location as we were the office to set up with the pt. Once I have the study back in our office I will un-register the device and put back into inventory. Per Ernie Hew, CMA she will interoffice the device back to [facility].

## 2021-09-06 NOTE — Telephone Encounter (Signed)
Device placed up front to be interoffice mailed to [facility]

## 2021-09-06 NOTE — Telephone Encounter (Signed)
Pt came in to [place] office today and dropped off his Itamar WatchPat device unopened. He stated he did not want to do the sleep study because "it is inconvenient for me to do this at home."   WatchPat with SN #:[id number] placed back in cabinet at [facility] office.

## 2021-09-06 NOTE — Telephone Encounter (Signed)
Spoke with pat;ient regarding the Thursday 10/19/21 10:45 am calcium scoring appointment at [address], [address]---arrival time is 10:30 am for check in---will mail information to patient and he voiced his understanding.,

## 2021-09-06 NOTE — Telephone Encounter (Signed)
Calcium score is scheduled for 10/19/21 at 1045.  Pt made aware of appt date and time by CT Scheduler.

## 2021-09-06 NOTE — Telephone Encounter (Signed)
Patient was a walk in today at [facility]---he returned his Home Sleep Study kit and has requested to have an Echocardiogram done.  He also wanted to schedule his Calcium scoring that was ordered in March 2022---I called to schedule but there is not a diagnosis attached to the order.  Please advise.

## 2021-09-07 DIAGNOSIS — M5136 Other intervertebral disc degeneration, lumbar region: Secondary | ICD-10-CM | POA: Diagnosis not present

## 2021-09-07 DIAGNOSIS — M9904 Segmental and somatic dysfunction of sacral region: Secondary | ICD-10-CM | POA: Diagnosis not present

## 2021-09-07 DIAGNOSIS — M9905 Segmental and somatic dysfunction of pelvic region: Secondary | ICD-10-CM | POA: Diagnosis not present

## 2021-09-07 DIAGNOSIS — M9903 Segmental and somatic dysfunction of lumbar region: Secondary | ICD-10-CM | POA: Diagnosis not present

## 2021-09-08 NOTE — Telephone Encounter (Signed)
Itamar Sleep device has been returned to [facility] location, where originally ordered. Box is un-opened. I will un-register the device and put back into rotation.

## 2021-09-11 DIAGNOSIS — M9904 Segmental and somatic dysfunction of sacral region: Secondary | ICD-10-CM | POA: Diagnosis not present

## 2021-09-11 DIAGNOSIS — M5136 Other intervertebral disc degeneration, lumbar region: Secondary | ICD-10-CM | POA: Diagnosis not present

## 2021-09-11 DIAGNOSIS — M9905 Segmental and somatic dysfunction of pelvic region: Secondary | ICD-10-CM | POA: Diagnosis not present

## 2021-09-11 DIAGNOSIS — M9903 Segmental and somatic dysfunction of lumbar region: Secondary | ICD-10-CM | POA: Diagnosis not present

## 2021-09-11 NOTE — Telephone Encounter (Signed)
ADDENDUM: I reviewing the chart in regard to [facility] device. I did see that the Itamar device was actually ordered by Christen Bame, NP when she was at the [facility] location. I have placed the device in interoffice mail, addressed to Ernie Hew, [place].  I have removed from the stock here at [facility] location.

## 2021-09-12 ENCOUNTER — Other Ambulatory Visit (HOSPITAL_BASED_OUTPATIENT_CLINIC_OR_DEPARTMENT_OTHER): Payer: Self-pay | Admitting: Cardiology

## 2021-09-12 DIAGNOSIS — M9903 Segmental and somatic dysfunction of lumbar region: Secondary | ICD-10-CM | POA: Diagnosis not present

## 2021-09-12 DIAGNOSIS — M5136 Other intervertebral disc degeneration, lumbar region: Secondary | ICD-10-CM | POA: Diagnosis not present

## 2021-09-12 DIAGNOSIS — M9905 Segmental and somatic dysfunction of pelvic region: Secondary | ICD-10-CM | POA: Diagnosis not present

## 2021-09-12 DIAGNOSIS — M9904 Segmental and somatic dysfunction of sacral region: Secondary | ICD-10-CM | POA: Diagnosis not present

## 2021-10-10 DIAGNOSIS — N1831 Chronic kidney disease, stage 3a: Secondary | ICD-10-CM | POA: Diagnosis not present

## 2021-10-10 DIAGNOSIS — E785 Hyperlipidemia, unspecified: Secondary | ICD-10-CM | POA: Diagnosis not present

## 2021-10-10 DIAGNOSIS — I129 Hypertensive chronic kidney disease with stage 1 through stage 4 chronic kidney disease, or unspecified chronic kidney disease: Secondary | ICD-10-CM | POA: Diagnosis not present

## 2021-10-10 DIAGNOSIS — I10 Essential (primary) hypertension: Secondary | ICD-10-CM | POA: Diagnosis not present

## 2021-10-19 ENCOUNTER — Other Ambulatory Visit: Payer: Medicare Other

## 2021-10-20 ENCOUNTER — Ambulatory Visit (INDEPENDENT_AMBULATORY_CARE_PROVIDER_SITE_OTHER)
Admission: RE | Admit: 2021-10-20 | Discharge: 2021-10-20 | Disposition: A | Discharge status: Home / Self Care | Payer: Self-pay | Source: Ambulatory Visit | Attending: Cardiology | Admitting: Cardiology

## 2021-10-20 ENCOUNTER — Other Ambulatory Visit: Payer: Self-pay

## 2021-10-20 ENCOUNTER — Telehealth: Payer: Self-pay

## 2021-10-20 ENCOUNTER — Telehealth: Payer: Self-pay | Admitting: Nurse Practitioner

## 2021-10-20 DIAGNOSIS — R079 Chest pain, unspecified: Secondary | ICD-10-CM

## 2021-10-20 DIAGNOSIS — E785 Hyperlipidemia, unspecified: Secondary | ICD-10-CM

## 2021-10-20 DIAGNOSIS — I77810 Thoracic aortic ectasia: Secondary | ICD-10-CM

## 2021-10-20 DIAGNOSIS — E782 Mixed hyperlipidemia: Secondary | ICD-10-CM

## 2021-10-20 NOTE — Telephone Encounter (Signed)
New message ? ? ? ?Patient had CT today and stopped by check out to schedule an echo.  However, there is no order in epic.  Patient was under the understanding that Sharyn Lull wanted him to have an echo.  Please call pt and let him know if an echo is to be scheduled. ?

## 2021-10-20 NOTE — Telephone Encounter (Signed)
-----   Message from [name], MD sent at 10/20/2021 10:20 AM EST ----- ?His calcium score was 0. This is great news and means he is at low risk of heart artery disease.  ? ?His CT scan did show that his aorta is dilated. To assess this further, can we obtain a CTA aorta as this is a more sensitive test to assess the size. We also just need to ensure his blood pressure is well controlled with a goal <120s/80s to prevent further dilation. He should continue the statin medication as well. In the future, we monitor this with yearly CT scans.  ? ?He has some incidental findings in the lungs with a very small nodule and a pulmonary arteriovenous malformation. These are benign and do not need further follow-up unless he has a history of tobacco use.  ?

## 2021-10-20 NOTE — Telephone Encounter (Signed)
Spoke with pt and advised of Calcium score results per Dr [name] as below.  Pt is agreeable to CT of Aorta.  He does check his BP at home and will contact office if BP consistently outside of parameters.  Order placed for CT.  Pt verbalizes understanding and thanked Therapist, sports for the call.  ?

## 2021-10-20 NOTE — Telephone Encounter (Signed)
Pt states that his dad had heart disease and he is anxious about his health currently. Pt states he did his CT this morning, but feels that "to check everything off the list and be proactive" he should have an ECHO done. No mention of test at OV with Christen Bame or [name] per notes. Before hanging up, pt also states "its been a while since I had a stress test on one of those treadmills too and my doctor told me I should do these things every few years." ?Will route to [facility] for advisement on requested tests. ?

## 2021-10-27 ENCOUNTER — Other Ambulatory Visit: Payer: Self-pay | Admitting: Cardiology

## 2021-10-27 DIAGNOSIS — G4733 Obstructive sleep apnea (adult) (pediatric): Secondary | ICD-10-CM

## 2021-11-06 DIAGNOSIS — R7301 Impaired fasting glucose: Secondary | ICD-10-CM | POA: Diagnosis not present

## 2021-11-06 DIAGNOSIS — I10 Essential (primary) hypertension: Secondary | ICD-10-CM | POA: Diagnosis not present

## 2021-11-06 DIAGNOSIS — E785 Hyperlipidemia, unspecified: Secondary | ICD-10-CM | POA: Diagnosis not present

## 2021-11-06 DIAGNOSIS — Z125 Encounter for screening for malignant neoplasm of prostate: Secondary | ICD-10-CM | POA: Diagnosis not present

## 2021-11-08 DIAGNOSIS — Z1339 Encounter for screening examination for other mental health and behavioral disorders: Secondary | ICD-10-CM | POA: Diagnosis not present

## 2021-11-08 DIAGNOSIS — R7301 Impaired fasting glucose: Secondary | ICD-10-CM | POA: Diagnosis not present

## 2021-11-08 DIAGNOSIS — K219 Gastro-esophageal reflux disease without esophagitis: Secondary | ICD-10-CM | POA: Diagnosis not present

## 2021-11-08 DIAGNOSIS — R8281 Pyuria: Secondary | ICD-10-CM | POA: Diagnosis not present

## 2021-11-08 DIAGNOSIS — N1831 Chronic kidney disease, stage 3a: Secondary | ICD-10-CM | POA: Diagnosis not present

## 2021-11-08 DIAGNOSIS — Z1211 Encounter for screening for malignant neoplasm of colon: Secondary | ICD-10-CM | POA: Diagnosis not present

## 2021-11-08 DIAGNOSIS — I10 Essential (primary) hypertension: Secondary | ICD-10-CM | POA: Diagnosis not present

## 2021-11-08 DIAGNOSIS — Z1331 Encounter for screening for depression: Secondary | ICD-10-CM | POA: Diagnosis not present

## 2021-11-08 DIAGNOSIS — E785 Hyperlipidemia, unspecified: Secondary | ICD-10-CM | POA: Diagnosis not present

## 2021-11-08 DIAGNOSIS — Z0000 Encounter for general adult medical examination without abnormal findings: Secondary | ICD-10-CM | POA: Diagnosis not present

## 2021-11-08 DIAGNOSIS — I129 Hypertensive chronic kidney disease with stage 1 through stage 4 chronic kidney disease, or unspecified chronic kidney disease: Secondary | ICD-10-CM | POA: Diagnosis not present

## 2021-11-20 ENCOUNTER — Other Ambulatory Visit: Payer: Self-pay

## 2021-11-20 DIAGNOSIS — I10 Essential (primary) hypertension: Secondary | ICD-10-CM

## 2021-12-12 DIAGNOSIS — Z1211 Encounter for screening for malignant neoplasm of colon: Secondary | ICD-10-CM | POA: Diagnosis not present

## 2021-12-13 ENCOUNTER — Telehealth: Payer: Self-pay | Admitting: *Deleted

## 2021-12-13 ENCOUNTER — Other Ambulatory Visit: Payer: Medicare Other | Admitting: *Deleted

## 2021-12-13 DIAGNOSIS — I10 Essential (primary) hypertension: Secondary | ICD-10-CM | POA: Diagnosis not present

## 2021-12-13 DIAGNOSIS — I77810 Thoracic aortic ectasia: Secondary | ICD-10-CM

## 2021-12-13 LAB — BASIC METABOLIC PANEL WITH GFR
BUN/Creatinine Ratio: 19 (ref 10–24)
BUN: 36 mg/dL — ABNORMAL HIGH (ref 8–27)
CO2: 26 mmol/L (ref 20–29)
Calcium: 9.9 mg/dL (ref 8.6–10.2)
Chloride: 104 mmol/L (ref 96–106)
Creatinine, Ser: 1.93 mg/dL — ABNORMAL HIGH (ref 0.76–1.27)
Glucose: 117 mg/dL — ABNORMAL HIGH (ref 70–99)
Potassium: 4.5 mmol/L (ref 3.5–5.2)
Sodium: 143 mmol/L (ref 134–144)
eGFR: 37 mL/min/{1.73_m2} — ABNORMAL LOW

## 2021-12-13 NOTE — Telephone Encounter (Signed)
-----   Message from [name], MD sent at 12/13/2021  2:41 PM EDT ----- ?His Cr is quite elevated at 1.93. Does he have known kidney disease? Is it possible to get his prior labs sent? I would hold off on his CTA until we get more information.  ?

## 2021-12-13 NOTE — Telephone Encounter (Signed)
Result Care Coordination ? ? ?Result Notes ? [name], LPN  ?11/16/8590  9:24 PM EDT Back to Top  ?  ?Called PCP office and left detailed message on the medical records dept to fax pts recent bmet result to Dr. [name] at [phone]. ?  ?Also spoke with Dr. [name] about pt reporting having known renal disease, and Dr. [name] advised that we cancel his CT ANGIO CHEST AORTA, and schedule him to have an MRA of the Chest done instead.  Will change the order to an MRA of the chest and send to [facility] Scheduling to call the pt and arrange this appt.  Cancelled CT Angio for 5/10. ?Pt aware of new plan and agrees with this.   ? [name], LPN  ?08/13/6577  0:38 PM EDT   ?  ?Spoke with the pt.  Pt states he does have history of renal disease.  He states his creatinine runs high at around 1.80.  Pt states he had labs done at his PCP's office a month ago.  Will reach out to Dr. Jacquiline Doe office and have them fax his most recent labs to [place].  Pt aware I will endorse this information to Dr. [name], and touch base with him with a further plan. ?Pt verbalized understanding and agrees with this plan.  ? [name], MD  ?12/13/2021  2:41 PM EDT   ?  ?His Cr is quite elevated at 1.93. Does he have known kidney disease? Is it possible to get his prior labs sent? I would hold off on his CTA until we get more information.   ? ?

## 2021-12-15 ENCOUNTER — Telehealth: Payer: Self-pay | Admitting: *Deleted

## 2021-12-15 NOTE — Telephone Encounter (Signed)
-----   Message from Joseph Compton sent at 12/15/2021  4:12 PM EDT ----- ?Regarding: RE: needs MRA OF CHEST WITH WITHOUT CONTRAST PER DR. [name] ?12-28-21 @ [facility]  pt is aware  ?----- Message ----- ?From: [name], LPN ?Sent: 12/13/2021   3:16 PM EDT ?To: Joseph Compton, [facility] ?Subject: needs MRA OF CHEST WITH WITHOUT CONTRAST PER# ? ?This pt was originally scheduled to have an CT ANGIO OF CHEST done on 5/10, but his renal function came back as elevated and he has renal disease.  I cancelled the CT Angio for then. ? ?Per Dr. [name], he needs to be scheduled to have an [name] to be done to assess dilation of aorta.  ?Order is in and pt is aware that you will call him back and arrange this test to be done instead.  ? ?Can you please call him and schedule and shoot me the date to give Dr. [name]? ? ?Thanks, ?Annastasia Haskins  ? ? ? ? ?

## 2021-12-20 ENCOUNTER — Ambulatory Visit (HOSPITAL_BASED_OUTPATIENT_CLINIC_OR_DEPARTMENT_OTHER): Payer: Medicare Other

## 2021-12-27 ENCOUNTER — Other Ambulatory Visit (HOSPITAL_BASED_OUTPATIENT_CLINIC_OR_DEPARTMENT_OTHER): Payer: Medicare Other

## 2021-12-28 ENCOUNTER — Ambulatory Visit (HOSPITAL_COMMUNITY)
Admission: RE | Admit: 2021-12-28 | Discharge: 2021-12-28 | Disposition: A | Discharge status: Home / Self Care | Payer: Medicare Other | Source: Ambulatory Visit | Attending: Cardiology | Admitting: Cardiology

## 2021-12-28 ENCOUNTER — Other Ambulatory Visit: Payer: Self-pay | Admitting: Cardiology

## 2021-12-28 ENCOUNTER — Telehealth: Payer: Self-pay | Admitting: *Deleted

## 2021-12-28 DIAGNOSIS — I77810 Thoracic aortic ectasia: Secondary | ICD-10-CM

## 2021-12-28 DIAGNOSIS — I712 Thoracic aortic aneurysm, without rupture, unspecified: Secondary | ICD-10-CM | POA: Diagnosis not present

## 2021-12-28 DIAGNOSIS — M419 Scoliosis, unspecified: Secondary | ICD-10-CM | POA: Diagnosis not present

## 2021-12-28 DIAGNOSIS — I517 Cardiomegaly: Secondary | ICD-10-CM | POA: Diagnosis not present

## 2021-12-28 MED ORDER — GADOBUTROL 1 MMOL/ML IV SOLN: 10.0000 mL | Freq: Once | INTRAVENOUS | Status: DC | PRN

## 2021-12-28 NOTE — Telephone Encounter (Signed)
The patient has been notified of the result and verbalized understanding.  All questions (if any) were answered.  Pt aware we will go ahead and place the order for him to get a repeat MRA of the Chest to be done in one year and send a message to our [facility] Schedulers to call him back to arrange this appt.  Future BMET placed as well.  Pt verbalized understanding and agrees with this plan.

## 2021-12-28 NOTE — Telephone Encounter (Signed)
-----   Message from [name], MD sent at 12/28/2021  4:06 PM EDT ----- His MRA scan shows his aorta is mildly dilated. We will continue to monitor this with yearly MRA scans going forward.

## 2021-12-31 NOTE — Progress Notes (Unsigned)
Cardiology Office Note:    Date:  12/31/2021   ID:  Octavious Zidek, DOB [birth year], MRN [id number]  PCP:  Burnard Bunting, MD   [place]  Cardiologist:  [name], MD  Advanced Practice Provider:  No care team member to display Electrophysiologist:  None    Referring MD: Burnard Bunting, MD     History of Present Illness:    Joseph Compton is a 66 y.o. male with a hx of HTN, HLD, GERD and family history of CAD who presents to clinic for follow-up.  Patient initially seen in 10/2020 for chest pressure/burning that was usually associated with meals. He was very active and had no symptoms during activity. Symptoms were deemed to unlikely be cardiac in nature. We obtained a Ca score on 10/2021 which showed a score of 0. His aorta was dilated at 85m and follow-up CTAMRA was recommended. We obtained a MRA due to baseline renal dysfunction which revealed mild dilation of ascending aorta at 4.3cm.   Today, ***  Past Medical History:  Diagnosis Date   Arthritis    Chest pain on exertion    Chronic kidney disease, stage 3a (HCC)    GERD (gastroesophageal reflux disease)    [place]  Hyperlipemia    Hypertension    Hypertensive kidney disease with CKD (chronic kidney disease) stage I    Impaired fasting glucose    Obesity    Radiculopathy, lumbar region     No past surgical history on file.  Current Medications: No outpatient medications have been marked as taking for the 01/03/22 encounter (Appointment) with PFreada Bergeron MD.     Allergies:   Patient has no known allergies.   Social History   Socioeconomic History   Marital status: Married    Spouse name: Not on file   Number of children: Not on file   Years of education: Not on file   Highest education level: Not on file  Occupational History   Not on file  Tobacco Use   Smoking status: Never   Smokeless tobacco: Never   Vaping Use   Vaping Use: Never used  Substance and Sexual Activity   Alcohol use: Never   Drug use: Never   Sexual activity: Yes  Other Topics Concern   Not on file  Social History Narrative   Not on file   Social Determinants of Health   Financial Resource Strain: Not on file  Food Insecurity: Not on file  Transportation Needs: Not on file  Physical Activity: Not on file  Stress: Not on file  Social Connections: Not on file     Family History: The patient's family history includes Angina in his father; Asthma in his father; Diabetes in his mother; Heart Problems in his father and mother; Heart failure in his father and mother; Hyperlipidemia in his father and mother; Hypertension in his mother; Hypothyroidism in his mother; Other in his father.  ROS:   Please see the history of present illness.    Review of Systems  Constitutional:  Negative for chills, diaphoresis and fever.  HENT:  Negative for hearing loss.   Eyes:  Negative for blurred vision and redness.  Respiratory:  Negative for shortness of breath.   Cardiovascular:  Positive for chest pain, palpitations and leg swelling. Negative for orthopnea and claudication.  Gastrointestinal:  Positive for heartburn. Negative for melena, nausea and vomiting.  Genitourinary:  Negative for hematuria.  Musculoskeletal:  Negative for falls and myalgias.  Neurological:  Negative for dizziness and loss of consciousness.  Endo/Heme/Allergies:  Negative for polydipsia.  Psychiatric/Behavioral:  Negative for substance abuse.    EKGs/Labs/Other Studies Reviewed:    The following studies were reviewed today: MRA Aortia 12/2021: IMPRESSION: 1. Limited examination in the absence of intravenous gadolinium contrast and due to motion related artifact. 2. Mild aneurysmal dilation of the ascending thoracic aorta measuring up to 4.3 cm. Recommend annual imaging followup by CTA or MRA. This recommendation follows  2010 ACCF/AHA/AATS/ACR/ASA/SCA/SCAI/SIR/STS/SVM Guidelines for the Diagnosis and Management of Patients with Thoracic Aortic Disease. Circulation. 2010; 121: I338-S505. Aortic aneurysm NOS (ICD10-I71.9) 3. Cardiomegaly with right heart enlargement. 4. Probable bilateral simple renal cysts.  Ca score 10/2021: IMPRESSION: 1. Coronary calcium score of 0. 2. Dilated ascending aorta to 45 mm at the level of the main PA bifurcation (non-contrast). Consider dedicated contrast CT of the aorta to fully evaluate.    EKG:  Unable to obtain today as leads would not stick to his chest and no razor available to shave.  Recent Labs: 12/13/2021: BUN 36; Creatinine, Ser 1.93; Potassium 4.5; Sodium 143  Recent Lipid Panel No results found for: CHOL, TRIG, HDL, CHOLHDL, VLDL, LDLCALC, LDLDIRECT   Physical Exam:    VS:  There were no vitals taken for this visit.    Wt Readings from Last 3 Encounters:  07/04/21 (P) 282 lb 11.2 oz (128.2 kg)  02/17/21 255 lb 1.2 oz (115.7 kg)  12/05/20 255 lb (115.7 kg)     GEN:  Well nourished, well developed in no acute distress HEENT: Normal NECK: No JVD; No carotid bruits CARDIAC: RRR, no murmurs, rubs, gallops RESPIRATORY:  Clear to auscultation without rales, wheezing or rhonchi  ABDOMEN: Soft, non-tender, non-distended MUSCULOSKELETAL:  No edema; No deformity  SKIN: Warm and dry NEUROLOGIC:  Alert and oriented x 3 PSYCHIATRIC:  Normal affect   ASSESSMENT:    No diagnosis found.  PLAN:    In order of problems listed above:  #Non-Cardiac Chest Pain: #GERD: Patient with substernal chest burning and pressure that occurs after eating a heavy meal especially when he lays down after a heavy meal. No exertional symptoms. Able to bike 32mn every other day and walk 2-3 miles 3-4x/week with his dog without issues. Notably has stopped his omeprazole. Discussed that his symptoms sound very consistent with GERD and he should resume his omeprazole, watch his  meal sizes and continue his weight loss efforts.  #HTN: Well controlled at home. -Continue metop succinate '25mg'$  XL daily -Continue olmesartan-HCTZ 20-12.'5mg'$   #HLD: #Family history of CAD: -Continue crestor '20mg'$  daily -Ca score 10/2021 0  #Dilated Ascending Aorta: Measures 4.3cm on MRA in 12/2021. -Continue yearly surveillance with MRA or TTE  #Suspected OSA: Patient with episodes of snoring and wakes up frequently in the middle of the night. Concerned he may have OSA. -Check sleep study    Medication Adjustments/Labs and Tests Ordered: Current medicines are reviewed at length with the patient today.  Concerns regarding medicines are outlined above.  No orders of the defined types were placed in this encounter.  No orders of the defined types were placed in this encounter.   There are no Patient Instructions on file for this visit.    Signed, HFreada Bergeron MD  12/31/2021 1:49 PM    [facility]

## 2022-01-03 ENCOUNTER — Ambulatory Visit (INDEPENDENT_AMBULATORY_CARE_PROVIDER_SITE_OTHER): Payer: Medicare Other | Admitting: Cardiology

## 2022-01-03 ENCOUNTER — Encounter: Payer: Self-pay | Admitting: Cardiology

## 2022-01-03 ENCOUNTER — Ambulatory Visit (AMBULATORY_SURGERY_CENTER): Payer: Medicare Other | Admitting: *Deleted

## 2022-01-03 VITALS — BP 118/68 | HR 66 | Ht 69.0 in | Wt 282.6 lb

## 2022-01-03 VITALS — Ht 69.0 in | Wt 270.0 lb

## 2022-01-03 DIAGNOSIS — I779 Disorder of arteries and arterioles, unspecified: Secondary | ICD-10-CM | POA: Diagnosis not present

## 2022-01-03 DIAGNOSIS — R400 Somnolence: Secondary | ICD-10-CM | POA: Diagnosis not present

## 2022-01-03 DIAGNOSIS — G4733 Obstructive sleep apnea (adult) (pediatric): Secondary | ICD-10-CM | POA: Diagnosis not present

## 2022-01-03 DIAGNOSIS — E785 Hyperlipidemia, unspecified: Secondary | ICD-10-CM

## 2022-01-03 DIAGNOSIS — K219 Gastro-esophageal reflux disease without esophagitis: Secondary | ICD-10-CM

## 2022-01-03 DIAGNOSIS — R0683 Snoring: Secondary | ICD-10-CM | POA: Diagnosis not present

## 2022-01-03 DIAGNOSIS — I77810 Thoracic aortic ectasia: Secondary | ICD-10-CM

## 2022-01-03 DIAGNOSIS — E782 Mixed hyperlipidemia: Secondary | ICD-10-CM

## 2022-01-03 DIAGNOSIS — I10 Essential (primary) hypertension: Secondary | ICD-10-CM

## 2022-01-03 DIAGNOSIS — Z1211 Encounter for screening for malignant neoplasm of colon: Secondary | ICD-10-CM

## 2022-01-03 MED ORDER — NA SULFATE-K SULFATE-MG SULF 17.5-3.13-1.6 GM/177ML PO SOLN: 2.0000 | Freq: Once | ORAL | 0 refills | Status: AC

## 2022-01-03 MED ORDER — METOPROLOL SUCCINATE ER 25 MG PO TB24: 25.0000 mg | ORAL_TABLET | Freq: Every day | ORAL | 2 refills | Status: DC

## 2022-01-03 NOTE — Progress Notes (Signed)
No egg or soy allergy known to patient  No issues known to pt with past sedation with any surgeries or procedures Patient denies ever being told they had issues or difficulty with intubation  No FH of Malignant Hyperthermia Pt is not on diet pills Pt is not on  home 02  Pt is not on blood thinners  Pt denies issues with constipation  No A fib or A flutter   Discussed with pt there will be an out-of-pocket cost for prep and that varies from $0 to 70 +  dollars - pt verbalized understanding  Pt instructed to use Singlecare.com or GoodRx for a price reduction on prep   PV completed over the phone. Pt verified name, DOB, address and insurance during PV today.   Pt encouraged to call with questions or issues.  If pt has My chart, procedure instructions sent via My Chart  Insurance confirmed with pt at [facility] today

## 2022-01-03 NOTE — Patient Instructions (Addendum)
Medication Instructions:   DECREASE YOUR METOPROLOL SUCCINATE (TOPROL XL) TO TAKING 25 MG BY MOUTH DAILY  *If you need a refill on your cardiac medications before your next appointment, please call your pharmacy*   Testing/Procedures:  Your physician has recommended that you have a sleep study. This test records several body functions during sleep, including: brain activity, eye movement, oxygen and carbon dioxide blood levels, heart rate and rhythm, breathing rate and rhythm, the flow of air through your mouth and nose, snoring, body muscle movements, and chest and belly movement.  OUR SLEEP STUDY COORDINATOR NINA JONES WILL BE IN CONTACT WITH YOU SOON TO ARRANGE THIS TEST  Your physician has requested that you have a carotid duplex. This test is an ultrasound of the carotid arteries in your neck. It looks at blood flow through these arteries that supply the brain with blood. Allow one hour for this exam. There are no restrictions or special instructions.   Follow-Up: At [facility], you and your health needs are our priority.  As part of our continuing mission to provide you with exceptional heart care, we have created designated Provider Care Teams.  These Care Teams include your primary Cardiologist (physician) and Advanced Practice Providers (APPs -  Physician Assistants and Nurse Practitioners) who all work together to provide you with the care you need, when you need it.  We recommend signing up for the patient portal called "MyChart".  Sign up information is provided on this After Visit Summary.  MyChart is used to connect with patients for Virtual Visits (Telemedicine).  Patients are able to view lab/test results, encounter notes, upcoming appointments, etc.  Non-urgent messages can be sent to your provider as well.   To learn more about what you can do with MyChart, go to [email or web address].    Your next appointment:   6 month(s)  The format for your next appointment:   In  Person  Provider:   [name], MD {  Important Information About Sugar

## 2022-01-03 NOTE — Progress Notes (Unsigned)
Cardiology Office Note:    Date:  01/03/2022   ID:  Joseph Compton, DOB Sep 05, 1955, MRN [id number]  PCP:  Burnard Bunting, MD   [place]  Cardiologist:  [name], MD  Advanced Practice Provider:  No care team member to display Electrophysiologist:  None    Referring MD: Burnard Bunting, MD     History of Present Illness:    Joseph Compton is a 66 y.o. male with a hx of HTN, HLD, GERD and family history of CAD who presents to clinic for follow-up.  Patient initially seen in 10/2020 for chest pressure/burning that was usually associated with meals. He was very active and had no symptoms during activity. Symptoms were deemed to unlikely be cardiac in nature. We obtained a Ca score on 10/2021 which showed a score of 0. His aorta was dilated at 34m and follow-up CTAMRA was recommended. We obtained a MRA due to baseline renal dysfunction which revealed mild dilation of ascending aorta at 4.3cm.   Today, the patient states that recently he gained some weight which is his main concern, but is generally feeling okay. He has not been pushing himself with cardio exercises due to limiting back and knee pain. However, he does walk daily for 1-3 miles. He states that he would be able to lose the weight if he worked hard with exercise. Soon he plans to receive epidural injections for his knee pain.  He also complains of swelling and varicose veins in his bilateral LE. Sometimes he does wear compression socks. Of note, he works part time and is frequently standing for 5-6 hours which exacerbates his edema.  After taking metoprolol 50 mg in the morning he feels significantly fatigued. He is also having difficulties with insomnia.  We reviewed in detail the results of his recent Ca score and MRA. A long time ago he reports there was slight stenosis found via carotid doppler. He asks if this should be repeated.  He reports that his latest A1C was 5.5. Regarding  his diet he has been using apple cider vinegar and olive oil for years.  He denies any palpitations, chest pain, or shortness of breath. No lightheadedness, headaches, syncope, orthopnea, or PND.    Past Medical History:  Diagnosis Date   Arthritis    Chest pain on exertion    Chronic kidney disease, stage 3a (HCC)    GERD (gastroesophageal reflux disease)    [place]  Hyperlipemia    Hypertension    Hypertensive kidney disease with CKD (chronic kidney disease) stage I    Impaired fasting glucose    Obesity    Radiculopathy, lumbar region     Past Surgical History:  Procedure Laterality Date   MOUTH SURGERY  2000    Current Medications: Current Meds  Medication Sig   Acetaminophen (TYLENOL) 325 MG CAPS    Apple Cider Vinegar 188 MG CAPS Take by mouth.   aspirin 81 MG chewable tablet    CYCLOBENZAPRINE HCL PO Take 10 mg by mouth 2 (two) times daily.   ibuprofen (ADVIL) 600 MG tablet Take 600 mg by mouth every 6 (six) hours as needed.   metoprolol succinate (TOPROL XL) 25 MG 24 hr tablet Take 1 tablet (25 mg total) by mouth daily.   Na Sulfate-K Sulfate-Mg Sulf 17.5-3.13-1.6 GM/177ML SOLN Take 2 Bottles by mouth once for 1 dose. Use generic please   olmesartan-hydrochlorothiazide (BENICAR HCT) 20-12.5 MG tablet  Take 1 tablet by mouth daily.   omeprazole (PRILOSEC) 40 MG capsule 1 CAP 30MINS BEFORE BREAKFAST DAILY   rosuvastatin (CRESTOR) 20 MG tablet Take 20 mg by mouth daily.   [DISCONTINUED] metoprolol succinate (TOPROL-XL) 50 MG 24 hr tablet Take 25 mg by mouth daily.     Allergies:   Patient has no known allergies.   Social History   Socioeconomic History   Marital status: Married    Spouse name: Not on file   Number of children: Not on file   Years of education: Not on file   Highest education level: Not on file  Occupational History   Not on file  Tobacco Use   Smoking status: Never   Smokeless tobacco:  Never  Vaping Use   Vaping Use: Never used  Substance and Sexual Activity   Alcohol use: Never    Comment: rarely   Drug use: Never   Sexual activity: Yes  Other Topics Concern   Not on file  Social History Narrative   Not on file   Social Determinants of Health   Financial Resource Strain: Not on file  Food Insecurity: Not on file  Transportation Needs: Not on file  Physical Activity: Not on file  Stress: Not on file  Social Connections: Not on file     Family History: The patient's family history includes Angina in his father; Asthma in his father; Colon polyps in his father; Diabetes in his mother; Heart Problems in his father and mother; Heart failure in his father and mother; Hyperlipidemia in his father and mother; Hypertension in his mother; Hypothyroidism in his mother; Other in his father. There is no history of Colon cancer, Esophageal cancer, Stomach cancer, or Rectal cancer.  ROS:   Please see the history of present illness.    Review of Systems  Constitutional:  Positive for malaise/fatigue. Negative for chills, diaphoresis and fever.  HENT:  Negative for hearing loss.   Eyes:  Negative for blurred vision and redness.  Respiratory:  Negative for shortness of breath.   Cardiovascular:  Positive for leg swelling. Negative for chest pain, palpitations, orthopnea and claudication.  Gastrointestinal:  Negative for heartburn, melena, nausea and vomiting.  Genitourinary:  Negative for hematuria.  Musculoskeletal:  Positive for back pain and joint pain (Knees). Negative for falls.  Neurological:  Negative for dizziness and loss of consciousness.  Endo/Heme/Allergies:  Negative for polydipsia.  Psychiatric/Behavioral:  Negative for substance abuse.    EKGs/Labs/Other Studies Reviewed:    The following studies were reviewed today:  MRA Aorta 12/2021: IMPRESSION: 1. Limited examination in the absence of intravenous gadolinium contrast and due to motion related  artifact. 2. Mild aneurysmal dilation of the ascending thoracic aorta measuring up to 4.3 cm. Recommend annual imaging followup by CTA or MRA. This recommendation follows 2010 ACCF/AHA/AATS/ACR/ASA/SCA/SCAI/SIR/STS/SVM Guidelines for the Diagnosis and Management of Patients with Thoracic Aortic Disease. Circulation. 2010; 121: F818-E993. Aortic aneurysm NOS (ICD10-I71.9) 3. Cardiomegaly with right heart enlargement. 4. Probable bilateral simple renal cysts.  Ca score 10/2021: IMPRESSION: 1. Coronary calcium score of 0. 2. Dilated ascending aorta to 45 mm at the level of the main PA bifurcation (non-contrast). Consider dedicated contrast CT of the aorta to fully evaluate.    EKG:  EKG is personally reviewed. 01/03/2022:  Sinus rhythm. Rate 66 bpm. 11/01/2020: Unable to obtain as leads would not stick to his chest and no razor available to shave.  Recent Labs: 12/13/2021: BUN 36; Creatinine, Ser 1.93; Potassium 4.5; Sodium 143  Recent Lipid Panel No results found for: CHOL, TRIG, HDL, CHOLHDL, VLDL, LDLCALC, LDLDIRECT   Physical Exam:    VS:  BP 118/68   Pulse 66   Ht '5\' 9"'$  (1.753 m)   Wt 282 lb 9.6 oz (128.2 kg)   SpO2 98%   BMI 41.73 kg/m     Wt Readings from Last 3 Encounters:  01/03/22 282 lb 9.6 oz (128.2 kg)  01/03/22 270 lb (122.5 kg)  07/04/21 (P) 282 lb 11.2 oz (128.2 kg)     GEN:  Well nourished, well developed in no acute distress HEENT: Normal NECK: No JVD; No carotid bruits CARDIAC: RRR, no murmurs, rubs, gallops RESPIRATORY:  Clear to auscultation without rales, wheezing or rhonchi  ABDOMEN: Soft, non-tender, non-distended MUSCULOSKELETAL:  No edema; No deformity  SKIN: Warm and dry; Varicosities of bilateral LE NEUROLOGIC:  Alert and oriented x 3 PSYCHIATRIC:  Normal affect   ASSESSMENT:    1. Essential hypertension   2. Snoring   3. OSA (obstructive sleep apnea)   4. Bilateral carotid artery disease, unspecified type ([place])   5. Mixed  hyperlipidemia   6. Gastroesophageal reflux disease, unspecified whether esophagitis present   7. Hyperlipidemia, unspecified hyperlipidemia type   8. Ascending aorta dilatation (HCC)   9. Daytime sleepiness     PLAN:    In order of problems listed above:  #Non-Cardiac Chest Pain: #GERD: Patient with substernal chest burning and pressure that occurs after eating a heavy meal especially when he lays down after a heavy meal. No exertional symptoms. Able to bike 42mn every other day and walk 2-3 miles 3-4x/week with his dog without issues. Notably has stopped his omeprazole. Discussed that his symptoms sound very consistent with GERD and he should resume his omeprazole, watch his meal sizes and continue his weight loss efforts.  #HTN: Well controlled at home. -Change metop succinate to '25mg'$  XL daily -Continue olmesartan-HCTZ 20-12.'5mg'$   #HLD: #Family history of CAD: -Continue crestor '20mg'$  daily -Ca score 10/2021 0  #Dilated Ascending Aorta: Measures 4.3cm on MRA in 12/2021. -Continue yearly surveillance with MRA or TTE -Continue crestor '20mg'$  daily -Continue ASA '81mg'$  daily -Discussed lifting restraints today  #Suspected OSA: Patient with episodes of snoring and wakes up frequently in the middle of the night. Concerned he may have OSA. -Check sleep study  #History of Carotid Stenosis: -Check carotid ultrasound for monitoring -Continue crestor '20mg'$  daily -Continue ASA '81mg'$  daily  Follow-up:  6 months.   Medication Adjustments/Labs and Tests Ordered: Current medicines are reviewed at length with the patient today.  Concerns regarding medicines are outlined above.   Orders Placed This Encounter  Procedures   EKG 12-Lead   Split night study   VAS [place]   Meds ordered this encounter  Medications   metoprolol succinate (TOPROL XL) 25 MG 24 hr tablet    Sig: Take 1 tablet (25 mg total) by mouth daily.    Dispense:  90 tablet    Refill:  2    DOSE DECREASED    Patient Instructions  Medication Instructions:   DECREASE YOUR METOPROLOL SUCCINATE (TOPROL XL) TO TAKING 25 MG BY MOUTH DAILY  *If you need a refill on your cardiac medications before your next appointment, please call your pharmacy*   Testing/Procedures:  Your physician has recommended that you have a sleep study. This test records several body functions during sleep, including: brain activity, eye movement, oxygen and carbon dioxide blood levels, heart rate and rhythm, breathing rate and rhythm, the  flow of air through your mouth and nose, snoring, body muscle movements, and chest and belly movement.  OUR SLEEP STUDY COORDINATOR NINA JONES WILL BE IN CONTACT WITH YOU SOON TO ARRANGE THIS TEST  Your physician has requested that you have a carotid duplex. This test is an ultrasound of the carotid arteries in your neck. It looks at blood flow through these arteries that supply the brain with blood. Allow one hour for this exam. There are no restrictions or special instructions.   Follow-Up: At [facility], you and your health needs are our priority.  As part of our continuing mission to provide you with exceptional heart care, we have created designated Provider Care Teams.  These Care Teams include your primary Cardiologist (physician) and Advanced Practice Providers (APPs -  Physician Assistants and Nurse Practitioners) who all work together to provide you with the care you need, when you need it.  We recommend signing up for the patient portal called "MyChart".  Sign up information is provided on this After Visit Summary.  MyChart is used to connect with patients for Virtual Visits (Telemedicine).  Patients are able to view lab/test results, encounter notes, upcoming appointments, etc.  Non-urgent messages can be sent to your provider as well.   To learn more about what you can do with MyChart, go to [email or web address].    Your next appointment:   6 month(s)  The format for  your next appointment:   In Person  Provider:   [name], MD {  Important Information About Sugar        I,Mathew Stumpf,acting as a scribe for [name], MD.,have documented all relevant documentation on the behalf of [name], MD,as directed by  [name], MD while in the presence of [name], MD.  I, [name], MD, have reviewed all documentation for this visit. The documentation on 01/03/22 for the exam, diagnosis, procedures, and orders are all accurate and complete.    Signed, [name], MD  01/03/2022 5:23 PM    [place]

## 2022-01-23 ENCOUNTER — Telehealth: Payer: Self-pay | Admitting: Gastroenterology

## 2022-01-23 NOTE — Telephone Encounter (Signed)
It is a routine screening procedure, and it would be best to put it off for a few weeks.  HD

## 2022-01-23 NOTE — Telephone Encounter (Signed)
Patient has been sick since Saturday, running a fever between 99 & 103.  Today it is 99.  He still does not feel well and feels very dehydrated.  He is questioning whether or not he should procedure with the procedure on Friday.  Please call and advise.  Thank you.

## 2022-01-24 ENCOUNTER — Telehealth: Payer: Self-pay | Admitting: Gastroenterology

## 2022-01-24 NOTE — Telephone Encounter (Signed)
LMOM that I will resent Suprep instructions for new procedure date- Wednesday 03-07-22 at 11:00 am.  New instructions sent via MyChart

## 2022-01-24 NOTE — Telephone Encounter (Signed)
Inbound call from patient stating he needed new instructions sent to Mychart due to  rescheduling his colon procedure to 7/26 at 11:00. Please advise.

## 2022-01-24 NOTE — Telephone Encounter (Signed)
Patient has been rescheduled for 7/26 at 11:00

## 2022-01-26 ENCOUNTER — Encounter: Payer: Medicare Other | Admitting: Gastroenterology

## 2022-01-29 ENCOUNTER — Ambulatory Visit (HOSPITAL_COMMUNITY)
Admission: RE | Admit: 2022-01-29 | Discharge: 2022-01-29 | Disposition: A | Discharge status: Home / Self Care | Payer: Medicare Other | Source: Ambulatory Visit | Attending: Cardiology | Admitting: Cardiology

## 2022-01-29 DIAGNOSIS — I779 Disorder of arteries and arterioles, unspecified: Secondary | ICD-10-CM | POA: Insufficient documentation

## 2022-01-29 DIAGNOSIS — I7789 Other specified disorders of arteries and arterioles: Secondary | ICD-10-CM

## 2022-01-31 ENCOUNTER — Telehealth: Payer: Self-pay | Admitting: *Deleted

## 2022-01-31 DIAGNOSIS — G4733 Obstructive sleep apnea (adult) (pediatric): Secondary | ICD-10-CM

## 2022-01-31 DIAGNOSIS — R400 Somnolence: Secondary | ICD-10-CM

## 2022-01-31 DIAGNOSIS — I10 Essential (primary) hypertension: Secondary | ICD-10-CM

## 2022-01-31 NOTE — Telephone Encounter (Signed)
Prior Authorization for SPLIT NIGHT sent to MEDICARE via web portal. Tracking Number.

## 2022-02-01 ENCOUNTER — Telehealth: Payer: Self-pay | Admitting: Cardiology

## 2022-02-01 NOTE — Telephone Encounter (Signed)
Pt returning nurses call. Pt states that he was at work and unable to take the call. He is now off work and can answer his phone. Please advise

## 2022-02-01 NOTE — Telephone Encounter (Signed)
The patient has been notified of the result and verbalized understanding.  All questions (if any) were answered. [name], LPN 8/91/6945 0:38 PM

## 2022-02-01 NOTE — Telephone Encounter (Signed)
[name], MD  01/31/2022  8:45 PM EDT     His carotids look great with no evidence of blockage. No further monitoring needed.    Left message for patient to call back.

## 2022-02-01 NOTE — Telephone Encounter (Signed)
Pt returning nurses call regarding test results. Please advise 

## 2022-02-28 DIAGNOSIS — M9904 Segmental and somatic dysfunction of sacral region: Secondary | ICD-10-CM | POA: Diagnosis not present

## 2022-02-28 DIAGNOSIS — M9903 Segmental and somatic dysfunction of lumbar region: Secondary | ICD-10-CM | POA: Diagnosis not present

## 2022-02-28 DIAGNOSIS — M5134 Other intervertebral disc degeneration, thoracic region: Secondary | ICD-10-CM | POA: Diagnosis not present

## 2022-02-28 DIAGNOSIS — M9902 Segmental and somatic dysfunction of thoracic region: Secondary | ICD-10-CM | POA: Diagnosis not present

## 2022-03-04 ENCOUNTER — Encounter: Payer: Self-pay | Admitting: Certified Registered Nurse Anesthetist

## 2022-03-05 DIAGNOSIS — M9903 Segmental and somatic dysfunction of lumbar region: Secondary | ICD-10-CM | POA: Diagnosis not present

## 2022-03-05 DIAGNOSIS — M9904 Segmental and somatic dysfunction of sacral region: Secondary | ICD-10-CM | POA: Diagnosis not present

## 2022-03-05 DIAGNOSIS — M9902 Segmental and somatic dysfunction of thoracic region: Secondary | ICD-10-CM | POA: Diagnosis not present

## 2022-03-05 DIAGNOSIS — M5134 Other intervertebral disc degeneration, thoracic region: Secondary | ICD-10-CM | POA: Diagnosis not present

## 2022-03-07 ENCOUNTER — Ambulatory Visit (AMBULATORY_SURGERY_CENTER): Payer: Medicare Other | Admitting: Gastroenterology

## 2022-03-07 ENCOUNTER — Encounter: Payer: Self-pay | Admitting: Gastroenterology

## 2022-03-07 VITALS — BP 116/63 | HR 68 | Temp 98.6°F | Resp 13 | Ht 69.0 in | Wt 270.0 lb

## 2022-03-07 DIAGNOSIS — Z1211 Encounter for screening for malignant neoplasm of colon: Secondary | ICD-10-CM

## 2022-03-07 DIAGNOSIS — I714 Abdominal aortic aneurysm, without rupture, unspecified: Secondary | ICD-10-CM

## 2022-03-07 DIAGNOSIS — N1831 Chronic kidney disease, stage 3a: Secondary | ICD-10-CM | POA: Diagnosis not present

## 2022-03-07 DIAGNOSIS — R195 Other fecal abnormalities: Secondary | ICD-10-CM | POA: Diagnosis not present

## 2022-03-07 DIAGNOSIS — D122 Benign neoplasm of ascending colon: Secondary | ICD-10-CM | POA: Diagnosis not present

## 2022-03-07 DIAGNOSIS — I129 Hypertensive chronic kidney disease with stage 1 through stage 4 chronic kidney disease, or unspecified chronic kidney disease: Secondary | ICD-10-CM | POA: Diagnosis not present

## 2022-03-07 HISTORY — DX: Abdominal aortic aneurysm, without rupture, unspecified: I71.40

## 2022-03-07 MED ORDER — SODIUM CHLORIDE 0.9 % IV SOLN: 500.0000 mL | Freq: Once | INTRAVENOUS | Status: DC

## 2022-03-07 NOTE — Op Note (Signed)
[place] Patient Name: Joseph Compton Procedure Date: 03/07/2022 11:08 AM MRN: [id number] Endoscopist: Concepcion. Loletha Carrow , MD Age: 66 Referring MD:  Date of Birth: Nov 09, 1955 Gender: Male Account #: [id number] Procedure:                Colonoscopy Indications:              Positive Cologuard test Medicines:                Monitored Anesthesia Care Procedure:                Pre-Anesthesia Assessment:                           - Prior to the procedure, a History and Physical                            was performed, and patient medications and                            allergies were reviewed. The patient's tolerance of                            previous anesthesia was also reviewed. The risks                            and benefits of the procedure and the sedation                            options and risks were discussed with the patient.                            All questions were answered, and informed consent                            was obtained. Prior Anticoagulants: The patient has                            taken no previous anticoagulant or antiplatelet                            agents. ASA Grade Assessment: III - A patient with                            severe systemic disease. After reviewing the risks                            and benefits, the patient was deemed in                            satisfactory condition to undergo the procedure.                           After obtaining informed consent, the colonoscope  was passed under direct vision. Throughout the                            procedure, the patient's blood pressure, pulse, and                            oxygen saturations were monitored continuously. The                            Olympus Scope [id number] was introduced through the                            anus and advanced to the the cecum, identified by                            appendiceal orifice and ileocecal  valve. The                            colonoscopy was performed without difficulty. The                            patient tolerated the procedure well. The quality                            of the bowel preparation was good. The ileocecal                            valve, appendiceal orifice, and rectum were                            photographed. Scope In: 11:17:05 AM Scope Out: 11:38:19 AM Scope Withdrawal Time: 0 hours 18 minutes 51 seconds  Total Procedure Duration: 0 hours 21 minutes 14 seconds  Findings:                 The perianal and digital rectal examinations were                            normal.                           Repeat examination of right colon under NBI                            performed.                           Three sessile polyps were found in the proximal                            ascending colon. The polyps were 5 to 8 mm in size.                            These polyps were removed with a cold snare.  Resection and retrieval were complete. (Jar 1)                           A 10 mm polyp was found in the hepatic flexure. The                            polyp was semi-pedunculated. The polyp was removed                            with a hot snare. Resection and retrieval were                            complete. (Jar 2)                           A 4 mm polyp was found in the hepatic flexure. The                            polyp was sessile. The polyp was removed with a                            cold snare. Resection and retrieval were complete.                            (Jar 2)                           The exam was otherwise without abnormality on                            direct and retroflexion views. Complications:            No immediate complications. Estimated Blood Loss:     Estimated blood loss was minimal. Impression:               - Three 5 to 8 mm polyps in the proximal ascending                             colon, removed with a cold snare. Resected and                            retrieved.                           - One 10 mm polyp at the hepatic flexure, removed                            with a hot snare. Resected and retrieved.                           - One 4 mm polyp at the hepatic flexure, removed                            with a cold snare. Resected and retrieved.                           -  The examination was otherwise normal on direct                            and retroflexion views. Recommendation:           - Patient has a contact number available for                            emergencies. The signs and symptoms of potential                            delayed complications were discussed with the                            patient. Return to normal activities tomorrow.                            Written discharge instructions were provided to the                            patient.                           - Resume previous diet.                           - Continue present medications.                           - Await pathology results.                           - Repeat colonoscopy is recommended for                            surveillance. The colonoscopy date will be                            determined after pathology results from today's                            exam become available for review. Kattie Santoyo L. Loletha Carrow, MD 03/07/2022 11:43:31 AM This report has been signed electronically.

## 2022-03-07 NOTE — Progress Notes (Signed)
1128 Ephedrine 10 mg given IV due to low BP, MD updated.

## 2022-03-07 NOTE — Patient Instructions (Signed)
Please read handouts provided. Continue present medications. Await pathology results. Resume previous diet.   YOU HAD AN ENDOSCOPIC PROCEDURE TODAY AT [facility]:   Refer to the procedure report that was given to you for any specific questions about what was found during the examination.  If the procedure report does not answer your questions, please call your gastroenterologist to clarify.  If you requested that your care partner not be given the details of your procedure findings, then the procedure report has been included in a sealed envelope for you to review at your convenience later.  YOU SHOULD EXPECT: Some feelings of bloating in the abdomen. Passage of more gas than usual.  Walking can help get rid of the air that was put into your GI tract during the procedure and reduce the bloating. If you had a lower endoscopy (such as a colonoscopy or flexible sigmoidoscopy) you may notice spotting of blood in your stool or on the toilet paper. If you underwent a bowel prep for your procedure, you may not have a normal bowel movement for a few days.  Please Note:  You might notice some irritation and congestion in your nose or some drainage.  This is from the oxygen used during your procedure.  There is no need for concern and it should clear up in a day or so.  SYMPTOMS TO REPORT IMMEDIATELY:  Following lower endoscopy (colonoscopy or flexible sigmoidoscopy):  Excessive amounts of blood in the stool  Significant tenderness or worsening of abdominal pains  Swelling of the abdomen that is new, acute  Fever of 100F or higher  For urgent or emergent issues, a gastroenterologist can be reached at any hour by calling [phone]. Do not use MyChart messaging for urgent concerns.    DIET:  We do recommend a small meal at first, but then you may proceed to your regular diet.  Drink plenty of fluids but you should avoid alcoholic beverages for 24 hours.  ACTIVITY:  You should  plan to take it easy for the rest of today and you should NOT DRIVE or use heavy machinery until tomorrow (because of the sedation medicines used during the test).    FOLLOW UP: Our staff will call the number listed on your records the next business day following your procedure.  We will call around 7:15- 8:00 am to check on you and address any questions or concerns that you may have regarding the information given to you following your procedure. If we do not reach you, we will leave a message.  If you develop any symptoms (ie: fever, flu-like symptoms, shortness of breath, cough etc.) before then, please call [phone].  If you test positive for Covid 19 in the 2 weeks post procedure, please call and report this information to [place].    If any biopsies were taken you will be contacted by phone or by letter within the next 1-3 weeks.  Please call us at [phone] if you have not heard about the biopsies in 3 weeks.    SIGNATURES/CONFIDENTIALITY: You and/or your care partner have signed paperwork which will be entered into your electronic medical record.  These signatures attest to the fact that that the information above on your After Visit Summary has been reviewed and is understood.  Full responsibility of the confidentiality of this discharge information lies with you and/or your care-partner.

## 2022-03-07 NOTE — Progress Notes (Signed)
Report given to PACU, vss 

## 2022-03-07 NOTE — Progress Notes (Signed)
History and Physical:  This patient presents for endoscopic testing for: Encounter Diagnosis  Name Primary?   Positive colorectal cancer screening using Cologuard test Yes    Referral from PCP indicates positive cologuard. Hgb normal March this year Positive hemosure at PCP in 2022  Patient reports intermittent constipation or diarrhea - denies rectal bleeding.No abdominal pain.  Patient is otherwise without complaints or active issues today.   Past Medical History: Past Medical History:  Diagnosis Date   Abdominal aortic aneurysm (AAA) 3.0 cm to 5.5 cm in diameter in male ([place]) 03/07/2022   PER PATIENT   Arthritis    Chest pain on exertion    Chronic kidney disease, stage 3a (HCC)    GERD (gastroesophageal reflux disease)    History of Helicobacter pylori infection    in [place]   Hyperlipemia    Hypertension    Hypertensive kidney disease with CKD (chronic kidney disease) stage I    Impaired fasting glucose    Obesity    Radiculopathy, lumbar region      Past Surgical History: Past Surgical History:  Procedure Laterality Date   MOUTH SURGERY  2000    Allergies: No Known Allergies  Outpatient Meds: Current Outpatient Medications  Medication Sig Dispense Refill   Acetaminophen (TYLENOL) 325 MG CAPS      Apple Cider Vinegar 188 MG CAPS Take by mouth.     aspirin 81 MG chewable tablet      metoprolol succinate (TOPROL XL) 25 MG 24 hr tablet Take 1 tablet (25 mg total) by mouth daily. 90 tablet 2   olmesartan-hydrochlorothiazide (BENICAR HCT) 20-12.5 MG tablet Take 1 tablet by mouth daily.     rosuvastatin (CRESTOR) 20 MG tablet Take 20 mg by mouth daily.     CYCLOBENZAPRINE HCL PO Take 10 mg by mouth 2 (two) times daily.     ibuprofen (ADVIL) 600 MG tablet Take 600 mg by mouth every 6 (six) hours as needed.     omeprazole (PRILOSEC) 40 MG capsule 1 CAP 30MINS BEFORE BREAKFAST DAILY     Current Facility-Administered Medications  Medication Dose Route  Frequency Provider Last Rate Last Admin   0.9 %  sodium chloride infusion  500 mL Intravenous Once Nelida Meuse III, MD          ___________________________________________________________________ Objective   Exam:  BP (!) 141/83   Pulse 76   Temp 98.6 F (37 C) (Temporal)   Resp 13   Ht '5\' 9"'$  (1.753 m)   Wt 270 lb (122.5 kg)   SpO2 99%   BMI 39.87 kg/m   CV: RRR without murmur, S1/S2 Resp: clear to auscultation bilaterally, normal RR and effort noted GI: soft, no tenderness, with active bowel sounds.(Obese)   Assessment: Encounter Diagnosis  Name Primary?   Positive colorectal cancer screening using Cologuard test Yes     Plan: Colonoscopy  The benefits and risks of the planned procedure were described in detail with the patient or (when appropriate) their health care proxy.  Risks were outlined as including, but not limited to, bleeding, infection, perforation, adverse medication reaction leading to cardiac or pulmonary decompensation, pancreatitis (if ERCP).  The limitation of incomplete mucosal visualization was also discussed.  No guarantees or warranties were given.    The patient is appropriate for an endoscopic procedure in the ambulatory setting.   - Wilfrid Lund, MD

## 2022-03-08 ENCOUNTER — Telehealth: Payer: Self-pay | Admitting: *Deleted

## 2022-03-08 NOTE — Telephone Encounter (Signed)
  Follow up Call-     03/07/2022   10:03 AM  Call back number  Post procedure Call Back phone  # [phone]  Permission to leave phone message Yes     Patient questions:  Do you have a fever, pain , or abdominal swelling? No. Pain Score  0 *  Have you tolerated food without any problems? Yes.    Have you been able to return to your normal activities? Yes.    Do you have any questions about your discharge instructions: Diet   No. Medications  No. Follow up visit  No.  Do you have questions or concerns about your Care? No.  Actions: * If pain score is 4 or above: No action needed, pain <4.

## 2022-03-12 ENCOUNTER — Encounter: Payer: Self-pay | Admitting: Gastroenterology

## 2022-03-26 DIAGNOSIS — M5134 Other intervertebral disc degeneration, thoracic region: Secondary | ICD-10-CM | POA: Diagnosis not present

## 2022-03-26 DIAGNOSIS — M9904 Segmental and somatic dysfunction of sacral region: Secondary | ICD-10-CM | POA: Diagnosis not present

## 2022-03-26 DIAGNOSIS — M9903 Segmental and somatic dysfunction of lumbar region: Secondary | ICD-10-CM | POA: Diagnosis not present

## 2022-03-26 DIAGNOSIS — M9902 Segmental and somatic dysfunction of thoracic region: Secondary | ICD-10-CM | POA: Diagnosis not present

## 2022-03-27 DIAGNOSIS — M9904 Segmental and somatic dysfunction of sacral region: Secondary | ICD-10-CM | POA: Diagnosis not present

## 2022-03-27 DIAGNOSIS — M5134 Other intervertebral disc degeneration, thoracic region: Secondary | ICD-10-CM | POA: Diagnosis not present

## 2022-03-27 DIAGNOSIS — M9902 Segmental and somatic dysfunction of thoracic region: Secondary | ICD-10-CM | POA: Diagnosis not present

## 2022-03-27 DIAGNOSIS — M9903 Segmental and somatic dysfunction of lumbar region: Secondary | ICD-10-CM | POA: Diagnosis not present

## 2022-03-29 DIAGNOSIS — M5134 Other intervertebral disc degeneration, thoracic region: Secondary | ICD-10-CM | POA: Diagnosis not present

## 2022-03-29 DIAGNOSIS — M9903 Segmental and somatic dysfunction of lumbar region: Secondary | ICD-10-CM | POA: Diagnosis not present

## 2022-03-29 DIAGNOSIS — M9902 Segmental and somatic dysfunction of thoracic region: Secondary | ICD-10-CM | POA: Diagnosis not present

## 2022-03-29 DIAGNOSIS — M9904 Segmental and somatic dysfunction of sacral region: Secondary | ICD-10-CM | POA: Diagnosis not present

## 2022-04-02 DIAGNOSIS — M9904 Segmental and somatic dysfunction of sacral region: Secondary | ICD-10-CM | POA: Diagnosis not present

## 2022-04-02 DIAGNOSIS — M9903 Segmental and somatic dysfunction of lumbar region: Secondary | ICD-10-CM | POA: Diagnosis not present

## 2022-04-02 DIAGNOSIS — M5134 Other intervertebral disc degeneration, thoracic region: Secondary | ICD-10-CM | POA: Diagnosis not present

## 2022-04-02 DIAGNOSIS — M9902 Segmental and somatic dysfunction of thoracic region: Secondary | ICD-10-CM | POA: Diagnosis not present

## 2022-05-02 DIAGNOSIS — R1013 Epigastric pain: Secondary | ICD-10-CM | POA: Diagnosis not present

## 2022-05-02 DIAGNOSIS — K439 Ventral hernia without obstruction or gangrene: Secondary | ICD-10-CM | POA: Diagnosis not present

## 2022-05-02 DIAGNOSIS — K219 Gastro-esophageal reflux disease without esophagitis: Secondary | ICD-10-CM | POA: Diagnosis not present

## 2022-05-02 DIAGNOSIS — E669 Obesity, unspecified: Secondary | ICD-10-CM | POA: Diagnosis not present

## 2022-05-02 DIAGNOSIS — Z86010 Personal history of colonic polyps: Secondary | ICD-10-CM | POA: Diagnosis not present

## 2022-05-02 DIAGNOSIS — R7301 Impaired fasting glucose: Secondary | ICD-10-CM | POA: Diagnosis not present

## 2022-05-02 DIAGNOSIS — M5416 Radiculopathy, lumbar region: Secondary | ICD-10-CM | POA: Diagnosis not present

## 2022-06-06 DIAGNOSIS — M5136 Other intervertebral disc degeneration, lumbar region: Secondary | ICD-10-CM | POA: Diagnosis not present

## 2022-06-06 DIAGNOSIS — M9905 Segmental and somatic dysfunction of pelvic region: Secondary | ICD-10-CM | POA: Diagnosis not present

## 2022-06-06 DIAGNOSIS — M9903 Segmental and somatic dysfunction of lumbar region: Secondary | ICD-10-CM | POA: Diagnosis not present

## 2022-06-06 DIAGNOSIS — M9904 Segmental and somatic dysfunction of sacral region: Secondary | ICD-10-CM | POA: Diagnosis not present

## 2022-06-15 ENCOUNTER — Encounter: Payer: Self-pay | Admitting: Cardiovascular Disease

## 2022-06-15 ENCOUNTER — Ambulatory Visit: Payer: Medicare Other | Admitting: Nurse Practitioner

## 2022-06-15 ENCOUNTER — Ambulatory Visit: Payer: Medicare Other | Attending: Cardiology | Admitting: Cardiovascular Disease

## 2022-06-15 VITALS — BP 120/76 | HR 75 | Ht 69.0 in | Wt 278.8 lb

## 2022-06-15 DIAGNOSIS — E782 Mixed hyperlipidemia: Secondary | ICD-10-CM | POA: Diagnosis not present

## 2022-06-15 NOTE — Patient Instructions (Signed)
Medication Instructions:  Your physician has recommended you make the following change in your medication: Stop Rosuvastatin  *If you need a refill on your cardiac medications before your next appointment, please call your pharmacy*   Lab Work: none If you have labs (blood work) drawn today and your tests are completely normal, you will receive your results only by: [place] (if you have MyChart) OR A paper copy in the mail If you have any lab test that is abnormal or we need to change your treatment, we will call you to review the results.   Testing/Procedures: We will check on the sleep study that has been ordered   Follow-Up: At [facility], you and your health needs are our priority.  As part of our continuing mission to provide you with exceptional heart care, we have created designated Provider Care Teams.  These Care Teams include your primary Cardiologist (physician) and Advanced Practice Providers (APPs -  Physician Assistants and Nurse Practitioners) who all work together to provide you with the care you need, when you need it.  We recommend signing up for the patient portal called "MyChart".  Sign up information is provided on this After Visit Summary.  MyChart is used to connect with patients for Virtual Visits (Telemedicine).  Patients are able to view lab/test results, encounter notes, upcoming appointments, etc.  Non-urgent messages can be sent to your provider as well.   To learn more about what you can do with MyChart, go to [email or web address].    Your next appointment:   6 month(s)  The format for your next appointment:   In Person  Provider:   [name], MD     Other Instructions  You have been referred to the lipid clinic in our office.  Please schedule new patient appointment    Mediterranean Diet A Mediterranean diet refers to food and lifestyle choices that are based on the traditions of countries located on the  [employer]. It focuses on eating more fruits, vegetables, whole grains, beans, nuts, seeds, and heart-healthy fats, and eating less dairy, meat, eggs, and processed foods with added sugar, salt, and fat. This way of eating has been shown to help prevent certain conditions and improve outcomes for people who have chronic diseases, like kidney disease and heart disease. What are tips for following this plan? Reading food labels Check the serving size of packaged foods. For foods such as rice and pasta, the serving size refers to the amount of cooked product, not dry. Check the total fat in packaged foods. Avoid foods that have saturated fat or trans fats. Check the ingredient list for added sugars, such as corn syrup. Shopping  Buy a variety of foods that offer a balanced diet, including: Fresh fruits and vegetables (produce). Grains, beans, nuts, and seeds. Some of these may be available in unpackaged forms or large amounts (in bulk). Fresh seafood. Poultry and eggs. Low-fat dairy products. Buy whole ingredients instead of prepackaged foods. Buy fresh fruits and vegetables in-season from local farmers markets. Buy plain frozen fruits and vegetables. If you do not have access to quality fresh seafood, buy precooked frozen shrimp or canned fish, such as tuna, salmon, or sardines. Stock your pantry so you always have certain foods on hand, such as olive oil, canned tuna, canned tomatoes, rice, pasta, and beans. Cooking Cook foods with extra-virgin olive oil instead of using butter or other vegetable oils. Have meat as a side dish, and have vegetables or grains as  your main dish. This means having meat in small portions or adding small amounts of meat to foods like pasta or stew. Use beans or vegetables instead of meat in common dishes like chili or lasagna. Experiment with different cooking methods. Try roasting, broiling, steaming, and sauting vegetables. Add frozen vegetables to soups,  stews, pasta, or rice. Add nuts or seeds for added healthy fats and plant protein at each meal. You can add these to yogurt, salads, or vegetable dishes. Marinate fish or vegetables using olive oil, lemon juice, garlic, and fresh herbs. Meal planning Plan to eat one vegetarian meal one day each week. Try to work up to two vegetarian meals, if possible. Eat seafood two or more times a week. Have healthy snacks readily available, such as: Vegetable sticks with hummus. Greek yogurt. Fruit and nut trail mix. Eat balanced meals throughout the week. This includes: Fruit: 2-3 servings a day. Vegetables: 4-5 servings a day. Low-fat dairy: 2 servings a day. Fish, poultry, or lean meat: 1 serving a day. Beans and legumes: 2 or more servings a week. Nuts and seeds: 1-2 servings a day. Whole grains: 6-8 servings a day. Extra-virgin olive oil: 3-4 servings a day. Limit red meat and sweets to only a few servings a month. Lifestyle  Cook and eat meals together with your family, when possible. Drink enough fluid to keep your urine pale yellow. Be physically active every day. This includes: Aerobic exercise like running or swimming. Leisure activities like gardening, walking, or housework. Get 7-8 hours of sleep each night. If recommended by your health care provider, drink red wine in moderation. This means 1 glass a day for nonpregnant women and 2 glasses a day for men. A glass of wine equals 5 oz (150 mL). What foods should I eat? Fruits Apples. Apricots. Avocado. Berries. Bananas. Cherries. Dates. Figs. Grapes. Lemons. Melon. Oranges. Peaches. Plums. Pomegranate. Vegetables Artichokes. Beets. Broccoli. Cabbage. Carrots. Eggplant. Green beans. Chard. Kale. Spinach. Onions. Leeks. Peas. Squash. Tomatoes. Peppers. Radishes. Grains Whole-grain pasta. Brown rice. Bulgur wheat. Polenta. Couscous. Whole-wheat bread. [place]. Meats and other proteins Beans. Almonds. Sunflower seeds. Pine  nuts. Peanuts. [place]. Salmon. Scallops. Shrimp. [place]. Tilapia. Clams. Oysters. Eggs. Poultry without skin. Dairy Low-fat milk. Cheese. Greek yogurt. Fats and oils Extra-virgin olive oil. Avocado oil. Grapeseed oil. Beverages Water. Red wine. Herbal tea. Sweets and desserts Greek yogurt with honey. Baked apples. Poached pears. Trail mix. Seasonings and condiments Basil. Cilantro. Coriander. Cumin. Mint. Parsley. Sage. Rosemary. Tarragon. Garlic. Oregano. Thyme. Pepper. Balsamic vinegar. Tahini. Hummus. Tomato sauce. Olives. Mushrooms. The items listed above may not be a complete list of foods and beverages you can eat. Contact a dietitian for more information. What foods should I limit? This is a list of foods that should be eaten rarely or only on special occasions. Fruits Fruit canned in syrup. Vegetables Deep-fried potatoes (french fries). Grains Prepackaged pasta or rice dishes. Prepackaged cereal with added sugar. Prepackaged snacks with added sugar. Meats and other proteins Beef. Pork. Lamb. Poultry with skin. Hot dogs. [place] Salines. Dairy Ice cream. Sour cream. Whole milk. Fats and oils Butter. Canola oil. Vegetable oil. Beef fat (tallow). Lard. Beverages Juice. Sugar-sweetened soft drinks. Beer. Liquor and spirits. Sweets and desserts Cookies. Cakes. Pies. Candy. Seasonings and condiments Mayonnaise. Pre-made sauces and marinades. The items listed above may not be a complete list of foods and beverages you should limit. Contact a dietitian for more information. Summary The Mediterranean diet includes both food and lifestyle choices. Eat a variety  of fresh fruits and vegetables, beans, nuts, seeds, and whole grains. Limit the amount of red meat and sweets that you eat. If recommended by your health care provider, drink red wine in moderation. This means 1 glass a day for nonpregnant women and 2 glasses a day for men. A glass of wine equals 5 oz (150 mL). This information is not  intended to replace advice given to you by your health care provider. Make sure you discuss any questions you have with your health care provider. Document Revised: 09/04/2019 Document Reviewed: 07/02/2019 Elsevier Patient Education  [place]

## 2022-06-15 NOTE — Progress Notes (Signed)
Cardiology Office Note:    Date:  06/15/2022   ID:  Joseph Compton, DOB [birth year], MRN [id number]  PCP:  Burnard Bunting, MD   [place] Prov[facility] Cardiologist:  [name], MD {    Referring MD: Burnard Bunting, MD   Chief Complaint  Patient presents with   Hypertension        aortic aneurism    06/15/22    Joseph Compton is a 66 y.o. male with a hx of CKD, HTN, CAD ( coronary calcification ) TIA . Asc. Thoracic aorta of 4.3 cm by MRA of the chest in May , 2023   He is seen today as a walk in visit. Arrived to be seen by Jaquelyn Bitter , NP - but did not get the message that Jaquelyn Bitter was out sick this week  Was placed on my DOD schedule as a work in visit      No CP,  has a twitch of pain his his chest on occasion Still needs to have his sleep   He stopped his statin due to lots of diffuse pain.   Will refer to the lipid clinic for consideratio of PCSK9 inhibitor   Still eats lots of processed meats.   Does get some exercise    Wt today is 278 lbs ( down 4 lbs from 6 months ago)     Past Medical History:  Diagnosis Date   Abdominal aortic aneurysm (AAA) 3.0 cm to 5.5 cm in diameter in male ([place]) 03/07/2022   PER PATIENT   Arthritis    Chest pain on exertion    Chronic kidney disease, stage 3a (HCC)    GERD (gastroesophageal reflux disease)    History of Helicobacter pylori infection    in [place]   History of transient ischemic attack (TIA)    per patient   Hyperlipemia    Hypertension    Hypertensive kidney disease with CKD (chronic kidney disease) stage I    Impaired fasting glucose    Obesity    Radiculopathy, lumbar region     Past Surgical History:  Procedure Laterality Date   MOUTH SURGERY  2000    Current Medications: Current Meds  Medication Sig   Acetaminophen (TYLENOL) 325 MG CAPS as needed.   Apple Cider Vinegar 188 MG CAPS Take by mouth.   aspirin 81 MG chewable tablet    ibuprofen (ADVIL) 600 MG tablet Take 600  mg by mouth every 6 (six) hours as needed.   metoprolol succinate (TOPROL XL) 25 MG 24 hr tablet Take 1 tablet (25 mg total) by mouth daily.   olmesartan-hydrochlorothiazide (BENICAR HCT) 20-12.5 MG tablet Take 1 tablet by mouth daily.   omeprazole (PRILOSEC) 40 MG capsule 1 CAP 30MINS BEFORE BREAKFAST DAILY   [DISCONTINUED] CYCLOBENZAPRINE HCL PO Take 10 mg by mouth 2 (two) times daily.   [DISCONTINUED] rosuvastatin (CRESTOR) 20 MG tablet Take 20 mg by mouth daily.     Allergies:   Patient has no known allergies.   Social History   Socioeconomic History   Marital status: Married    Spouse name: Not on file   Number of children: Not on file   Years of education: Not on file   Highest education level: Not on file  Occupational History   Not on file  Tobacco Use   Smoking status: Never   Smokeless tobacco: Never  Vaping Use   Vaping Use: Never used  Substance and Sexual Activity   Alcohol use: Never  Comment: rarely   Drug use: Never   Sexual activity: Yes  Other Topics Concern   Not on file  Social History Narrative   Not on file   Social Determinants of Health   Financial Resource Strain: Not on file  Food Insecurity: Not on file  Transportation Needs: Not on file  Physical Activity: Not on file  Stress: Not on file  Social Connections: Not on file     Family History: The patient's family history includes Angina in his father; Asthma in his father; Colon polyps in his father; Diabetes in his mother; Heart Problems in his father and mother; Heart failure in his father and mother; Hyperlipidemia in his father and mother; Hypertension in his mother; Hypothyroidism in his mother; Other in his father. There is no history of Colon cancer, Esophageal cancer, Stomach cancer, or Rectal cancer.  ROS:   Please see the history of present illness.     All other systems reviewed and are negative.  EKGs/Labs/Other Studies Reviewed:    The following studies were reviewed  today:   EKG:     Recent Labs: 12/13/2021: BUN 36; Creatinine, Ser 1.93; Potassium 4.5; Sodium 143  Recent Lipid Panel No results found for: "CHOL", "TRIG", "HDL", "CHOLHDL", "VLDL", "LDLCALC", "LDLDIRECT"   Risk Assessment/Calculations:          STOP-Bang Score:  6       Physical Exam:    VS:  BP 120/76   Pulse 75   Ht '5\' 9"'$  (1.753 m)   Wt 278 lb 12.8 oz (126.5 kg)   SpO2 98%   BMI 41.17 kg/m     Wt Readings from Last 3 Encounters:  06/15/22 278 lb 12.8 oz (126.5 kg)  03/07/22 270 lb (122.5 kg)  01/03/22 282 lb 9.6 oz (128.2 kg)     GEN:  Well nourished, well developed in no acute distress HEENT: Normal NECK: No JVD; No carotid bruits LYMPHATICS: No lymphadenopathy CARDIAC: RRR, no murmurs, rubs, gallops RESPIRATORY:  Clear to auscultation without rales, wheezing or rhonchi  ABDOMEN: Soft, non-tender, non-distended MUSCULOSKELETAL:  No edema; No deformity  SKIN: Warm and dry NEUROLOGIC:  Alert and oriented x 3 PSYCHIATRIC:  Normal affect   ASSESSMENT:    1. Mixed hyperlipidemia    PLAN:    In order of problems listed above:  Thoracic aortic aneurysm: He has a thoracic aortic aneurysm of 4.3 cm.  He has been followed with yearly MR angiograms. His next study is scheduled for May, 2022.  2.  Hypertension: Blood pressures fairly well controlled today.  3.  Possible obstructive sleep apnea.  Dr. [name] placed an order for a sleep study.  I have encouraged him to go ahead and complete his sleep study.  We will have him see Dr. [name] in 6 months for follow-up visit.       Medication Adjustments/Labs and Tests Ordered: Current medicines are reviewed at length with the patient today.  Concerns regarding medicines are outlined above.  Orders Placed This Encounter  Procedures   AMB Referral to [facility]   No orders of the defined types were placed in this encounter.   Patient Instructions  Medication Instructions:  Your physician has  recommended you make the following change in your medication: Stop Rosuvastatin  *If you need a refill on your cardiac medications before your next appointment, please call your pharmacy*   Lab Work: none If you have labs (blood work) drawn today and your tests are completely normal, you  will receive your results only by: [place] (if you have MyChart) OR A paper copy in the mail If you have any lab test that is abnormal or we need to change your treatment, we will call you to review the results.   Testing/Procedures: We will check on the sleep study that has been ordered   Follow-Up: At [facility], you and your health needs are our priority.  As part of our continuing mission to provide you with exceptional heart care, we have created designated Provider Care Teams.  These Care Teams include your primary Cardiologist (physician) and Advanced Practice Providers (APPs -  Physician Assistants and Nurse Practitioners) who all work together to provide you with the care you need, when you need it.  We recommend signing up for the patient portal called "MyChart".  Sign up information is provided on this After Visit Summary.  MyChart is used to connect with patients for Virtual Visits (Telemedicine).  Patients are able to view lab/test results, encounter notes, upcoming appointments, etc.  Non-urgent messages can be sent to your provider as well.   To learn more about what you can do with MyChart, go to [email or web address].    Your next appointment:   6 month(s)  The format for your next appointment:   In Person  Provider:   [name], MD     Other Instructions  You have been referred to the lipid clinic in our office.  Please schedule new patient appointment    Mediterranean Diet A Mediterranean diet refers to food and lifestyle choices that are based on the traditions of countries located on the [employer]. It focuses on eating more fruits,  vegetables, whole grains, beans, nuts, seeds, and heart-healthy fats, and eating less dairy, meat, eggs, and processed foods with added sugar, salt, and fat. This way of eating has been shown to help prevent certain conditions and improve outcomes for people who have chronic diseases, like kidney disease and heart disease. What are tips for following this plan? Reading food labels Check the serving size of packaged foods. For foods such as rice and pasta, the serving size refers to the amount of cooked product, not dry. Check the total fat in packaged foods. Avoid foods that have saturated fat or trans fats. Check the ingredient list for added sugars, such as corn syrup. Shopping  Buy a variety of foods that offer a balanced diet, including: Fresh fruits and vegetables (produce). Grains, beans, nuts, and seeds. Some of these may be available in unpackaged forms or large amounts (in bulk). Fresh seafood. Poultry and eggs. Low-fat dairy products. Buy whole ingredients instead of prepackaged foods. Buy fresh fruits and vegetables in-season from local farmers markets. Buy plain frozen fruits and vegetables. If you do not have access to quality fresh seafood, buy precooked frozen shrimp or canned fish, such as tuna, salmon, or sardines. Stock your pantry so you always have certain foods on hand, such as olive oil, canned tuna, canned tomatoes, rice, pasta, and beans. Cooking Cook foods with extra-virgin olive oil instead of using butter or other vegetable oils. Have meat as a side dish, and have vegetables or grains as your main dish. This means having meat in small portions or adding small amounts of meat to foods like pasta or stew. Use beans or vegetables instead of meat in common dishes like chili or lasagna. Experiment with different cooking methods. Try roasting, broiling, steaming, and sauting vegetables. Add frozen vegetables to soups, stews, pasta,  or rice. Add nuts or seeds for added  healthy fats and plant protein at each meal. You can add these to yogurt, salads, or vegetable dishes. Marinate fish or vegetables using olive oil, lemon juice, garlic, and fresh herbs. Meal planning Plan to eat one vegetarian meal one day each week. Try to work up to two vegetarian meals, if possible. Eat seafood two or more times a week. Have healthy snacks readily available, such as: Vegetable sticks with hummus. Greek yogurt. Fruit and nut trail mix. Eat balanced meals throughout the week. This includes: Fruit: 2-3 servings a day. Vegetables: 4-5 servings a day. Low-fat dairy: 2 servings a day. Fish, poultry, or lean meat: 1 serving a day. Beans and legumes: 2 or more servings a week. Nuts and seeds: 1-2 servings a day. Whole grains: 6-8 servings a day. Extra-virgin olive oil: 3-4 servings a day. Limit red meat and sweets to only a few servings a month. Lifestyle  Cook and eat meals together with your family, when possible. Drink enough fluid to keep your urine pale yellow. Be physically active every day. This includes: Aerobic exercise like running or swimming. Leisure activities like gardening, walking, or housework. Get 7-8 hours of sleep each night. If recommended by your health care provider, drink red wine in moderation. This means 1 glass a day for nonpregnant women and 2 glasses a day for men. A glass of wine equals 5 oz (150 mL). What foods should I eat? Fruits Apples. Apricots. Avocado. Berries. Bananas. Cherries. Dates. Figs. Grapes. Lemons. Melon. Oranges. Peaches. Plums. Pomegranate. Vegetables Artichokes. Beets. Broccoli. Cabbage. Carrots. Eggplant. Green beans. Chard. Kale. Spinach. Onions. Leeks. Peas. Squash. Tomatoes. Peppers. Radishes. Grains Whole-grain pasta. Brown rice. Bulgur wheat. Polenta. Couscous. Whole-wheat bread. [place]. Meats and other proteins Beans. Almonds. Sunflower seeds. Pine nuts. Peanuts. [place]. Salmon. Scallops. Shrimp. [place].  Tilapia. Clams. Oysters. Eggs. Poultry without skin. Dairy Low-fat milk. Cheese. Greek yogurt. Fats and oils Extra-virgin olive oil. Avocado oil. Grapeseed oil. Beverages Water. Red wine. Herbal tea. Sweets and desserts Greek yogurt with honey. Baked apples. Poached pears. Trail mix. Seasonings and condiments Basil. Cilantro. Coriander. Cumin. Mint. Parsley. Sage. Rosemary. Tarragon. Garlic. Oregano. Thyme. Pepper. Balsamic vinegar. Tahini. Hummus. Tomato sauce. Olives. Mushrooms. The items listed above may not be a complete list of foods and beverages you can eat. Contact a dietitian for more information. What foods should I limit? This is a list of foods that should be eaten rarely or only on special occasions. Fruits Fruit canned in syrup. Vegetables Deep-fried potatoes (french fries). Grains Prepackaged pasta or rice dishes. Prepackaged cereal with added sugar. Prepackaged snacks with added sugar. Meats and other proteins Beef. Pork. Lamb. Poultry with skin. Hot dogs. Berniece Salines. Dairy Ice cream. Sour cream. Whole milk. Fats and oils Butter. Canola oil. Vegetable oil. Beef fat (tallow). Lard. Beverages Juice. Sugar-sweetened soft drinks. Beer. Liquor and spirits. Sweets and desserts Cookies. Cakes. Pies. Candy. Seasonings and condiments Mayonnaise. Pre-made sauces and marinades. The items listed above may not be a complete list of foods and beverages you should limit. Contact a dietitian for more information. Summary The Mediterranean diet includes both food and lifestyle choices. Eat a variety of fresh fruits and vegetables, beans, nuts, seeds, and whole grains. Limit the amount of red meat and sweets that you eat. If recommended by your health care provider, drink red wine in moderation. This means 1 glass a day for nonpregnant women and 2 glasses a day for men. A glass of wine equals  5 oz (150 mL). This information is not intended to replace advice given to you by your health  care provider. Make sure you discuss any questions you have with your health care provider. Document Revised: 09/04/2019 Document Reviewed: 07/02/2019 Elsevier Patient Education  [place], Mertie Moores, MD  06/15/2022 4:16 PM    [place]

## 2022-06-19 NOTE — Telephone Encounter (Signed)
Split night ordered 

## 2022-06-19 NOTE — Addendum Note (Signed)
Addended by: [name] on: 06/19/2022 06:28 PM   Modules accepted: Orders

## 2022-06-19 NOTE — Telephone Encounter (Signed)
sleep study [name], RN  [name], CMA Cc: [name], LPN Patient saw Dr [name] today and he would like to have the sleep study Dr [name] ordered.  Can you contact him to schedule this?

## 2022-07-03 DIAGNOSIS — M9903 Segmental and somatic dysfunction of lumbar region: Secondary | ICD-10-CM | POA: Diagnosis not present

## 2022-07-03 DIAGNOSIS — M9904 Segmental and somatic dysfunction of sacral region: Secondary | ICD-10-CM | POA: Diagnosis not present

## 2022-07-03 DIAGNOSIS — M5136 Other intervertebral disc degeneration, lumbar region: Secondary | ICD-10-CM | POA: Diagnosis not present

## 2022-07-03 DIAGNOSIS — M9905 Segmental and somatic dysfunction of pelvic region: Secondary | ICD-10-CM | POA: Diagnosis not present

## 2022-07-09 ENCOUNTER — Ambulatory Visit: Payer: Medicare Other | Admitting: Cardiology

## 2022-07-23 DIAGNOSIS — M5136 Other intervertebral disc degeneration, lumbar region: Secondary | ICD-10-CM | POA: Diagnosis not present

## 2022-07-23 DIAGNOSIS — M9903 Segmental and somatic dysfunction of lumbar region: Secondary | ICD-10-CM | POA: Diagnosis not present

## 2022-07-23 DIAGNOSIS — M9904 Segmental and somatic dysfunction of sacral region: Secondary | ICD-10-CM | POA: Diagnosis not present

## 2022-07-23 DIAGNOSIS — M9905 Segmental and somatic dysfunction of pelvic region: Secondary | ICD-10-CM | POA: Diagnosis not present

## 2022-07-24 IMAGING — MR MR MRA CHEST W/ OR W/O CM
12 series · 16 of 16 positions shown · IV contrast (agent unspecified)
Comparison: None Available.

CLINICAL DATA: Ascending aortic dilation

EXAM:
MRA CHEST WITH OR WITHOUT CONTRAST
TECHNIQUE: Angiographic images of the chest were obtained using MRA technique
without intravenous contrast.
CONTRAST:  None.

[Series 3: cor_trufi_fb · coronal · 4.5mm · 0.74mm/px · 2 of 128 slices shown]
[im 1/128]
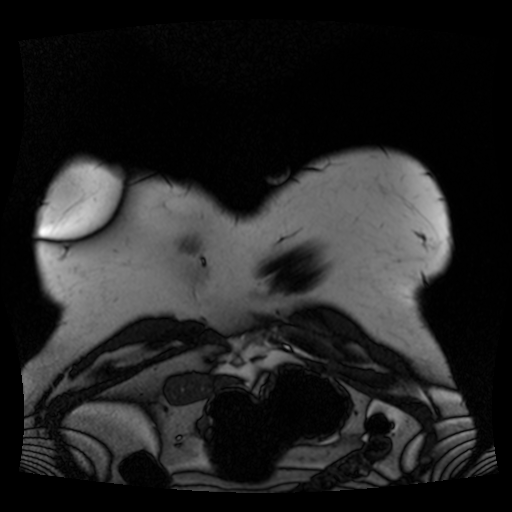
[im 128/128]
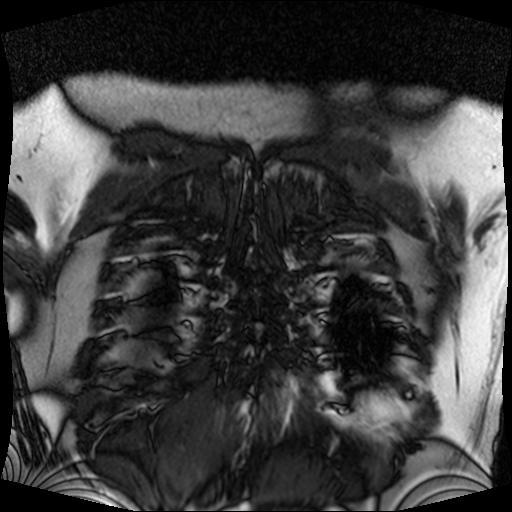

[Series 4: cor haste · coronal · 8.0mm · 1.25mm/px · 1 of 30 slices shown]
[im 1/30]
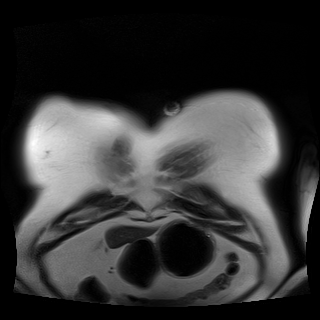

[Series 5: ax_trufi_trig · axial · 8.0mm · 1.33mm/px · 1 of 30 slices shown]
[im 1/30]
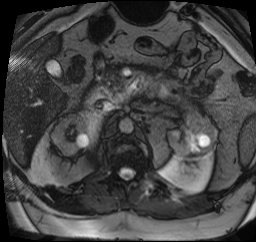

[Series 6: ax _haste_trig · axial · 8.0mm · 1.48mm/px · 1 of 26 slices shown]
[im 1/26]
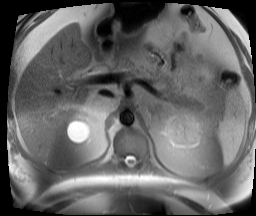

[Series 7: optional para-sag_cine_ aortic · oblique · 6.0mm · 1.77mm/px · 1 of 75 slices shown]
[im 1/75]
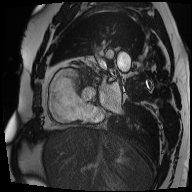

[Series 8: T1 dynamic · axial · 3.0mm · 1.25mm/px · z∈[-447,-162]mm · 2 of 96 slices shown (1 of 4)]
[im 1/96]
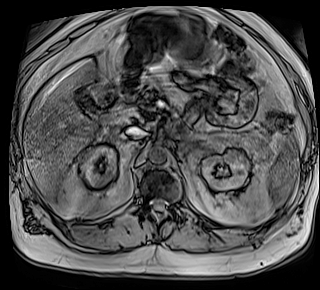
[im 96/96]
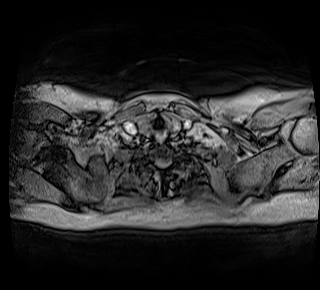

[Series 9: T1 dynamic · axial · 3.0mm · 1.25mm/px · z∈[-447,-162]mm · 2 of 96 slices shown (2 of 4)]
[im 1/96]
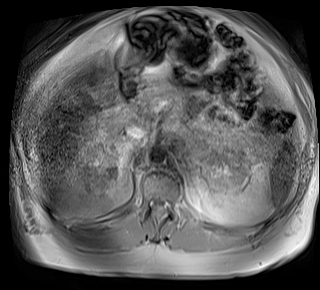
[im 96/96]
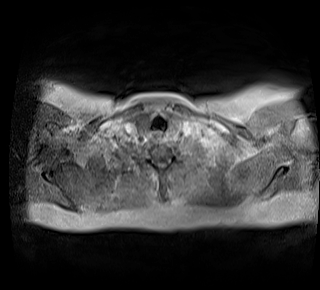

[Series 11: T1 dynamic · axial · 3.0mm · 1.25mm/px · z∈[-447,-162]mm · 2 of 96 slices shown (3 of 4)]
[im 1/96]
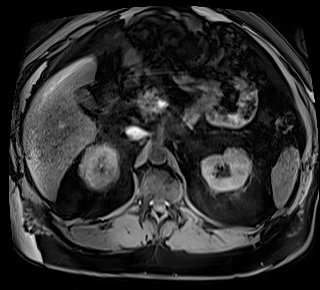
[im 96/96]
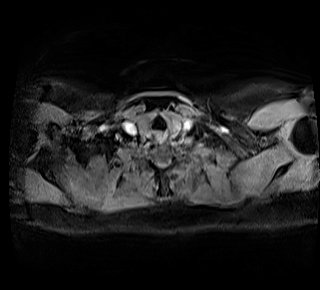

[Series 14: lao aorta candy · oblique · 8.0mm · 1.77mm/px · 1 of 8 slices shown (1 of 2)]
[im 1/8]
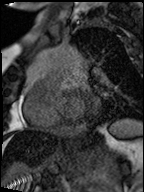

[Series 15: lao aorta candy · oblique · 8.0mm · 1.77mm/px · 1 of 8 slices shown (2 of 2)]
[im 1/8]
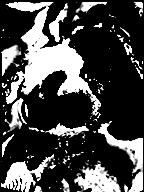

[Series 16: lao aorta sag · oblique · 6.0mm · 1.77mm/px · 1 of 51 slices shown]
[im 1/51]
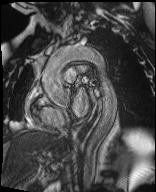

[Series 18: T1 dynamic · coronal · 5.0mm · 1.41mm/px · 1 of 60 slices shown (4 of 4)]
[im 1/60]
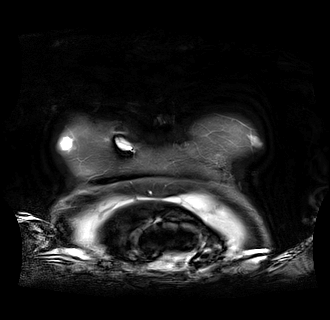

[16 of 16 positions shown; findings below may reference images not displayed]

FINDINGS: VASCULAR

Aorta: Extremity limited study in the absence of intravenous
contrast. The aortic root appears normal in caliber. Mild fusiform
aneurysmal dilation of the ascending thoracic aorta measuring up to
4.3 cm.

Heart: Cardiomegaly with right heart enlargement. No pericardial
effusion.

Pulmonary Arteries:  Unremarkable main pulmonary artery

Other: None.

NON-VASCULAR

Limited evaluation in the absence of intravenous contrast and due to
moderate motion related artifact. Circumscribed T2 hyperintense
lesions in the upper poles of both kidneys are incompletely
characterized without intravenous contrast but are statistically
highly likely to represent simple cysts. No internal septations or
nodularity identified. Levoconvex scoliosis of the lower thoracic
spine.
IMPRESSION: 1. Limited examination in the absence of intravenous gadolinium
contrast and due to motion related artifact.
2. Mild aneurysmal dilation of the ascending thoracic aorta
measuring up to 4.3 cm. Recommend annual imaging followup by CTA or
MRA. This recommendation follows 0686
ACCF/AHA/AATS/ACR/ASA/SCA/AUJLA/ALISIA/SENURI/ROBERTO BEPPE Guidelines for the
Diagnosis and Management of Patients with Thoracic Aortic Disease.
Circulation. 0686; 121: [id number]. Aortic aneurysm NOS (UEKIS-PTE.0)
3. Cardiomegaly with right heart enlargement.
4. Probable bilateral simple renal cysts.

## 2022-07-26 NOTE — Progress Notes (Signed)
Patient ID: Joseph Compton                 DOB: Jan 05, 1956                    MRN: [id number]      HPI: Kaige Whistler is a 66 y.o. male patient referred to lipid clinic by Dr.Nahser. PMH is significant for  CKD, HTN, family history of CAD    Today patient has no acute concern. Patient has self adjusted the dose on rosuvastatin from past 6 months; has been on 10 mg instead of 20 mg and tolerates it better. Diet needs lots of improvement. He eats large meals specially supper. He has been physically active. Walks regularly and occasionally go to gym only do cardio at the gym no weight. As LDLc on high intensity statin was slightly above the goal discuss adding ezetimibe to the moderate intensity statin and patient is in agreement to try as he has strong family history of heart disease in both mother and father side of the family.  Diet: does not eat healthy and small meals. However motivated to make changes to the current diet habits   Exercise: walk 2-5 miles per day (45 min) sometime go to gym - only do cardio  Family History: father- heart failure, mother-  CKD and Herat issue died at 88, father side uncle and aunt - heart diseases   Social History:  Alcohol: 1 drink per month Smoking: quit 40 years ago (was smoking 1 pack per week)   Labs: Lipid Panel  LDLc: 84 TC: 154, HDL: 50, TG: 100 (12/13/2021)  CrCl: 48 ml/min (Adj BW)  Past Medical History:  Diagnosis Date   Abdominal aortic aneurysm (AAA) 3.0 cm to 5.5 cm in diameter in male ([place]) 03/07/2022   PER PATIENT   Arthritis    Chest pain on exertion    Chronic kidney disease, stage 3a (HCC)    GERD (gastroesophageal reflux disease)    History of Helicobacter pylori infection    in [place]   History of transient ischemic attack (TIA)    per patient   Hyperlipemia    Hypertension    Hypertensive kidney disease with CKD (chronic kidney disease) stage I    Impaired fasting glucose    Obesity    Radiculopathy, lumbar region      Current Outpatient Medications on File Prior to Visit  Medication Sig Dispense Refill   Acetaminophen (TYLENOL) 325 MG CAPS as needed.     Apple Cider Vinegar 188 MG CAPS Take by mouth.     aspirin 81 MG chewable tablet      ibuprofen (ADVIL) 600 MG tablet Take 600 mg by mouth every 6 (six) hours as needed.     metoprolol succinate (TOPROL XL) 25 MG 24 hr tablet Take 1 tablet (25 mg total) by mouth daily. 90 tablet 2   olmesartan-hydrochlorothiazide (BENICAR HCT) 20-12.5 MG tablet Take 1 tablet by mouth daily.     omeprazole (PRILOSEC) 40 MG capsule 1 CAP 30MINS BEFORE BREAKFAST DAILY     No current facility-administered medications on file prior to visit.    No Known Allergies   Problem  Dyslipidemia, Goal Ldl Below 70   Current Medications: Rosuvastatin 10 mg daily  Intolerances: rosuvastatin 20 mg -muscle pain  Risk Factors:  CKD, hypertension, family HX of CAD, 10 years ASCVD risk 14% LDL goal: <70 mg/dl     Dyslipidemia, goal LDL below 70 Assessment:  LDL goal: <  70 mg/dl last LDLc 84 mg/dl (12/2021) while on high intensity statin Unable to tolerate  rosuvastatin 20 mg - muscle pain  Patient self adjusted the dose to rosuvastatin 10 mg - tolerates it well and have been on that dose for now past 6 months Given slightly above LDLc on high intensity statin reasonable to add ezetimibe to current moderate intensity statin  Plan: Continue taking current medications (rosuvastatin 10 mg) Start taking ezetimibe 10 mg daily   Will check Lipid  in 2-3 months to see effectiveness   Thank you,  Cammy Copa, Pharm.D  [facility] [address] [place], [place] [address]  Phone: [phone]; Fax: [phone]

## 2022-07-27 ENCOUNTER — Ambulatory Visit: Payer: Medicare Other | Attending: Cardiovascular Disease | Admitting: Student

## 2022-07-27 VITALS — BP 128/70 | HR 66

## 2022-07-27 DIAGNOSIS — E785 Hyperlipidemia, unspecified: Secondary | ICD-10-CM

## 2022-07-27 MED ORDER — EZETIMIBE 10 MG PO TABS: 10.0000 mg | ORAL_TABLET | Freq: Every day | ORAL | 3 refills | Status: DC

## 2022-07-27 MED ORDER — ROSUVASTATIN CALCIUM 10 MG PO TABS: 10.0000 mg | ORAL_TABLET | Freq: Every day | ORAL | 3 refills | Status: DC

## 2022-07-27 NOTE — Assessment & Plan Note (Signed)
Assessment:  LDL goal: < 70 mg/dl last LDLc 84 mg/dl (12/2021) while on high intensity statin Unable to tolerate  rosuvastatin 20 mg - muscle pain  Patient self adjusted the dose to rosuvastatin 10 mg - tolerates it well and have been on that dose for now past 6 months Given slightly above LDLc on high intensity statin reasonable to add ezetimibe to current moderate intensity statin  Plan: Continue taking current medications (rosuvastatin 10 mg) Start taking ezetimibe 10 mg daily   Will check Lipid  in 2-3 months to see effectiveness

## 2022-07-31 ENCOUNTER — Telehealth: Payer: Self-pay | Admitting: *Deleted

## 2022-07-31 DIAGNOSIS — I77810 Thoracic aortic ectasia: Secondary | ICD-10-CM

## 2022-07-31 DIAGNOSIS — Z0189 Encounter for other specified special examinations: Secondary | ICD-10-CM

## 2022-07-31 NOTE — Telephone Encounter (Signed)
-----   Message from Armando Gang sent at 07/27/2022  4:18 PM EST ----- Regarding: bmp order Need order for bmp/ patient is having ct angio done on 10-19-22.

## 2022-07-31 NOTE — Telephone Encounter (Signed)
BMET order placed per [facility] Scheduling dept request, for upcoming CT ANGIO in March 2024.  This is per CT Protocol.    Will make [facility] Scheduling team aware of order placed so that a lab appt can be scheduled prior to his CT Angio in March.

## 2022-09-03 DIAGNOSIS — M9903 Segmental and somatic dysfunction of lumbar region: Secondary | ICD-10-CM | POA: Diagnosis not present

## 2022-09-03 DIAGNOSIS — M9904 Segmental and somatic dysfunction of sacral region: Secondary | ICD-10-CM | POA: Diagnosis not present

## 2022-09-03 DIAGNOSIS — M9905 Segmental and somatic dysfunction of pelvic region: Secondary | ICD-10-CM | POA: Diagnosis not present

## 2022-09-03 DIAGNOSIS — M5136 Other intervertebral disc degeneration, lumbar region: Secondary | ICD-10-CM | POA: Diagnosis not present

## 2022-09-04 DIAGNOSIS — M9905 Segmental and somatic dysfunction of pelvic region: Secondary | ICD-10-CM | POA: Diagnosis not present

## 2022-09-04 DIAGNOSIS — M9904 Segmental and somatic dysfunction of sacral region: Secondary | ICD-10-CM | POA: Diagnosis not present

## 2022-09-04 DIAGNOSIS — M5136 Other intervertebral disc degeneration, lumbar region: Secondary | ICD-10-CM | POA: Diagnosis not present

## 2022-09-04 DIAGNOSIS — M9903 Segmental and somatic dysfunction of lumbar region: Secondary | ICD-10-CM | POA: Diagnosis not present

## 2022-09-05 DIAGNOSIS — M9903 Segmental and somatic dysfunction of lumbar region: Secondary | ICD-10-CM | POA: Diagnosis not present

## 2022-09-05 DIAGNOSIS — M5136 Other intervertebral disc degeneration, lumbar region: Secondary | ICD-10-CM | POA: Diagnosis not present

## 2022-09-05 DIAGNOSIS — M9905 Segmental and somatic dysfunction of pelvic region: Secondary | ICD-10-CM | POA: Diagnosis not present

## 2022-09-05 DIAGNOSIS — M9904 Segmental and somatic dysfunction of sacral region: Secondary | ICD-10-CM | POA: Diagnosis not present

## 2022-09-10 DIAGNOSIS — M9905 Segmental and somatic dysfunction of pelvic region: Secondary | ICD-10-CM | POA: Diagnosis not present

## 2022-09-10 DIAGNOSIS — M9903 Segmental and somatic dysfunction of lumbar region: Secondary | ICD-10-CM | POA: Diagnosis not present

## 2022-09-10 DIAGNOSIS — M9904 Segmental and somatic dysfunction of sacral region: Secondary | ICD-10-CM | POA: Diagnosis not present

## 2022-09-10 DIAGNOSIS — M5136 Other intervertebral disc degeneration, lumbar region: Secondary | ICD-10-CM | POA: Diagnosis not present

## 2022-09-12 DIAGNOSIS — M9903 Segmental and somatic dysfunction of lumbar region: Secondary | ICD-10-CM | POA: Diagnosis not present

## 2022-09-12 DIAGNOSIS — M5136 Other intervertebral disc degeneration, lumbar region: Secondary | ICD-10-CM | POA: Diagnosis not present

## 2022-09-12 DIAGNOSIS — M9905 Segmental and somatic dysfunction of pelvic region: Secondary | ICD-10-CM | POA: Diagnosis not present

## 2022-09-12 DIAGNOSIS — M9904 Segmental and somatic dysfunction of sacral region: Secondary | ICD-10-CM | POA: Diagnosis not present

## 2022-09-19 DIAGNOSIS — M9905 Segmental and somatic dysfunction of pelvic region: Secondary | ICD-10-CM | POA: Diagnosis not present

## 2022-09-19 DIAGNOSIS — M5136 Other intervertebral disc degeneration, lumbar region: Secondary | ICD-10-CM | POA: Diagnosis not present

## 2022-09-19 DIAGNOSIS — M9904 Segmental and somatic dysfunction of sacral region: Secondary | ICD-10-CM | POA: Diagnosis not present

## 2022-09-19 DIAGNOSIS — M9903 Segmental and somatic dysfunction of lumbar region: Secondary | ICD-10-CM | POA: Diagnosis not present

## 2022-09-25 DIAGNOSIS — M9903 Segmental and somatic dysfunction of lumbar region: Secondary | ICD-10-CM | POA: Diagnosis not present

## 2022-09-25 DIAGNOSIS — M9905 Segmental and somatic dysfunction of pelvic region: Secondary | ICD-10-CM | POA: Diagnosis not present

## 2022-09-25 DIAGNOSIS — M5136 Other intervertebral disc degeneration, lumbar region: Secondary | ICD-10-CM | POA: Diagnosis not present

## 2022-09-25 DIAGNOSIS — M9904 Segmental and somatic dysfunction of sacral region: Secondary | ICD-10-CM | POA: Diagnosis not present

## 2022-09-26 DIAGNOSIS — M9904 Segmental and somatic dysfunction of sacral region: Secondary | ICD-10-CM | POA: Diagnosis not present

## 2022-09-26 DIAGNOSIS — M5136 Other intervertebral disc degeneration, lumbar region: Secondary | ICD-10-CM | POA: Diagnosis not present

## 2022-09-26 DIAGNOSIS — M9903 Segmental and somatic dysfunction of lumbar region: Secondary | ICD-10-CM | POA: Diagnosis not present

## 2022-09-26 DIAGNOSIS — M9905 Segmental and somatic dysfunction of pelvic region: Secondary | ICD-10-CM | POA: Diagnosis not present

## 2022-10-03 ENCOUNTER — Other Ambulatory Visit: Payer: Self-pay

## 2022-10-03 DIAGNOSIS — G4733 Obstructive sleep apnea (adult) (pediatric): Secondary | ICD-10-CM

## 2022-10-03 DIAGNOSIS — M9905 Segmental and somatic dysfunction of pelvic region: Secondary | ICD-10-CM | POA: Diagnosis not present

## 2022-10-03 DIAGNOSIS — M9904 Segmental and somatic dysfunction of sacral region: Secondary | ICD-10-CM | POA: Diagnosis not present

## 2022-10-03 DIAGNOSIS — M5136 Other intervertebral disc degeneration, lumbar region: Secondary | ICD-10-CM | POA: Diagnosis not present

## 2022-10-03 DIAGNOSIS — M9903 Segmental and somatic dysfunction of lumbar region: Secondary | ICD-10-CM | POA: Diagnosis not present

## 2022-10-03 DIAGNOSIS — I779 Disorder of arteries and arterioles, unspecified: Secondary | ICD-10-CM

## 2022-10-03 DIAGNOSIS — R0683 Snoring: Secondary | ICD-10-CM

## 2022-10-03 MED ORDER — METOPROLOL SUCCINATE ER 25 MG PO TB24: 25.0000 mg | ORAL_TABLET | Freq: Every day | ORAL | 0 refills | Status: DC

## 2022-10-10 ENCOUNTER — Other Ambulatory Visit: Payer: Medicare Other

## 2022-10-17 ENCOUNTER — Encounter (HOSPITAL_BASED_OUTPATIENT_CLINIC_OR_DEPARTMENT_OTHER): Payer: Self-pay

## 2022-10-17 ENCOUNTER — Ambulatory Visit (HOSPITAL_BASED_OUTPATIENT_CLINIC_OR_DEPARTMENT_OTHER)
Admission: RE | Admit: 2022-10-17 | Discharge: 2022-10-17 | Disposition: A | Discharge status: Home / Self Care | Payer: Medicare Other | Source: Ambulatory Visit | Attending: Cardiology | Admitting: Cardiology

## 2022-10-17 DIAGNOSIS — I77810 Thoracic aortic ectasia: Secondary | ICD-10-CM | POA: Insufficient documentation

## 2022-10-17 LAB — POCT I-STAT CREATININE: Creatinine, Ser: 2.3 mg/dL — ABNORMAL HIGH (ref 0.61–1.24)

## 2022-10-18 DIAGNOSIS — H16223 Keratoconjunctivitis sicca, not specified as Sjogren's, bilateral: Secondary | ICD-10-CM | POA: Diagnosis not present

## 2022-10-23 ENCOUNTER — Telehealth: Payer: Self-pay | Admitting: Cardiology

## 2022-10-23 NOTE — Telephone Encounter (Signed)
Called the pt back.    Informed the pt that he is not getting a CT ANGIO CHEST AORTA in May (due to history of renal disease).  He is aware that we have him scheduled to get an MRA OF CHEST  done  instead, due to history of renal disease.  He is aware this test is safe for his kidneys.  He is aware that we advised on this a year ago (see telenote from 12/15/2021 for details).   Pt is aware that his MRA is schedule for 12/28/22 at 12 pm at [facility].    He is aware that he has an OV with Dr. [name] on 12/12/22 at 0920.  Pt verbalized understanding and agrees with this plan.

## 2022-10-23 NOTE — Telephone Encounter (Signed)
Patient states he is following-up on orders for a CT Angio test without dyes or other options.

## 2022-11-12 DIAGNOSIS — N1831 Chronic kidney disease, stage 3a: Secondary | ICD-10-CM | POA: Diagnosis not present

## 2022-11-12 DIAGNOSIS — Z1331 Encounter for screening for depression: Secondary | ICD-10-CM | POA: Diagnosis not present

## 2022-11-12 DIAGNOSIS — Z0000 Encounter for general adult medical examination without abnormal findings: Secondary | ICD-10-CM | POA: Diagnosis not present

## 2022-11-12 DIAGNOSIS — K219 Gastro-esophageal reflux disease without esophagitis: Secondary | ICD-10-CM | POA: Diagnosis not present

## 2022-11-12 DIAGNOSIS — I10 Essential (primary) hypertension: Secondary | ICD-10-CM | POA: Diagnosis not present

## 2022-11-12 DIAGNOSIS — E785 Hyperlipidemia, unspecified: Secondary | ICD-10-CM | POA: Diagnosis not present

## 2022-11-12 DIAGNOSIS — R7301 Impaired fasting glucose: Secondary | ICD-10-CM | POA: Diagnosis not present

## 2022-11-12 DIAGNOSIS — Z1339 Encounter for screening examination for other mental health and behavioral disorders: Secondary | ICD-10-CM | POA: Diagnosis not present

## 2022-11-12 DIAGNOSIS — I129 Hypertensive chronic kidney disease with stage 1 through stage 4 chronic kidney disease, or unspecified chronic kidney disease: Secondary | ICD-10-CM | POA: Diagnosis not present

## 2022-11-12 DIAGNOSIS — E669 Obesity, unspecified: Secondary | ICD-10-CM | POA: Diagnosis not present

## 2022-11-13 ENCOUNTER — Telehealth: Payer: Self-pay | Admitting: Diagnostic Neuroimaging

## 2022-11-13 NOTE — Telephone Encounter (Signed)
Received sleep referral from [facility] from Dr. Reynaldo Minium. Placed in sleep referrals box

## 2022-11-21 ENCOUNTER — Encounter: Payer: Self-pay | Admitting: Podiatry

## 2022-11-21 ENCOUNTER — Ambulatory Visit (INDEPENDENT_AMBULATORY_CARE_PROVIDER_SITE_OTHER): Payer: Medicare Other | Admitting: Podiatry

## 2022-11-21 DIAGNOSIS — R269 Unspecified abnormalities of gait and mobility: Secondary | ICD-10-CM

## 2022-11-25 ENCOUNTER — Encounter: Payer: Self-pay | Admitting: Podiatry

## 2022-11-25 NOTE — Progress Notes (Signed)
  Subjective:  Patient ID: Joseph Compton, male    DOB: 20-Sep-1955,  MRN: [id number]  Chief Complaint  Patient presents with   Flat Foot    New Patient -bilateral flat foot, not painful in his feet but he does have pain in his knees and legs - tried inserts from the [employer] store, but they did not help at all    67 y.o. male presents with the above complaint. History confirmed with patient.  Feels like he has balance and strength issues  Objective:  Physical Exam: warm, good capillary refill, no trophic changes or ulcerative lesions, normal DP and PT pulses, and 5 out of 5 strength, good smooth range of motion of both feet, no abnormalities  Assessment:  No diagnosis found.   Plan:  Patient was evaluated and treated and all questions answered.  Do not see any direct deformity stenosis or arthritic changes within his foot on clinical examination.  He does note that he has strengthening balance and gait issues.  Suspect this likely is related to issues with his lower back.  I recommended physical therapy for this and a referral was sent.  I will see him back in 2 months for follow-up.  Return in about 8 weeks (around 01/16/2023) for follow up after PT.

## 2022-12-09 NOTE — Progress Notes (Unsigned)
Cardiology Office Note:    Date:  12/12/2022   ID:  Joseph Compton, DOB 1956/04/11, MRN [id number]  PCP:  Geoffry Paradise, MD   [facility]  Cardiologist:  Meriam Sprague, MD  Advanced Practice Provider:  No care team member to display Electrophysiologist:  None    Referring MD: Geoffry Paradise, MD     History of Present Illness:    Joseph Compton is a 67 y.o. male with a hx of HTN, HLD, GERD and family history of CAD who presents to clinic for follow-up.  Patient initially seen in 10/2020 for chest pressure/burning that was usually associated with meals. He was very active and had no symptoms during activity. Symptoms were deemed to unlikely be cardiac in nature. We obtained a Ca score on 10/2021 which showed a score of 0. His aorta was dilated at 45mm and follow-up CTAMRA was recommended. We obtained a MRA due to baseline renal dysfunction which revealed mild dilation of ascending aorta at 4.3cm.   Was last seen by Dr. Elease Hashimoto on 06/2022 where he had stopped statins due to myalgias. Was referred to lipid clinic and started on zetia 10mg  daily. Did not scheduled his sleep study.  Today, the patient states that he overall feels well. States he has occasional chest twitching but no exertional chest discomfort. Otherwise, no SOB, orthopnea, PND or palpitations. Has occasional LE edema. Rides his bike every morning for without exertional symptoms. Blood pressure is well controlled and at goal. Tolerating medications as prescribed.   Past Medical History:  Diagnosis Date   Abdominal aortic aneurysm (AAA) 3.0 cm to 5.5 cm in diameter in male [facility]) 03/07/2022   PER PATIENT   Arthritis    Chest pain on exertion    Chronic kidney disease, stage 3a (HCC)    GERD (gastroesophageal reflux disease)    History of Helicobacter pylori infection    in [place]   History of transient ischemic attack (TIA)    per patient   Hyperlipemia    Hypertension     Hypertensive kidney disease with CKD (chronic kidney disease) stage I    Impaired fasting glucose    Obesity    Radiculopathy, lumbar region     Past Surgical History:  Procedure Laterality Date   MOUTH SURGERY  2000    Current Medications: Current Meds  Medication Sig   Acetaminophen (TYLENOL) 325 MG CAPS as needed.   Apple Cider Vinegar 188 MG CAPS Take by mouth.   aspirin 81 MG chewable tablet    ezetimibe (ZETIA) 10 MG tablet Take 1 tablet (10 mg total) by mouth daily.   ibuprofen (ADVIL) 600 MG tablet Take 600 mg by mouth every 6 (six) hours as needed.   metoprolol succinate (TOPROL XL) 25 MG 24 hr tablet Take 1 tablet (25 mg total) by mouth daily. Please keep scheduled appointment for future refills. Thank you.   olmesartan-hydrochlorothiazide (BENICAR HCT) 20-12.5 MG tablet Take 1 tablet by mouth daily.   omeprazole (PRILOSEC) 40 MG capsule 1 CAP BEFORE BREAKFAST DAILY   oxyCODONE-acetaminophen (PERCOCET/ROXICET) 5-325 MG tablet Take 1 tablet twice a day by oral route as needed for pain for 7 days.   pantoprazole (PROTONIX) 40 MG tablet Take 40 mg by mouth 2 (two) times daily.   pregabalin (LYRICA) 75 MG capsule TAKE 1 CAPSULE TWICE A DAY BY ORAL ROUTE AS NEEDED FOR 10 DAYS.   rosuvastatin (CRESTOR) 10 MG tablet Take 1 tablet (10 mg total) by mouth  daily.   traZODone (DESYREL) 50 MG tablet Take 1/2-1 tablets at bedtime as needed Orally Once a day for 30 days     Allergies:   Patient has no known allergies.   Social History   Socioeconomic History   Marital status: Married    Spouse name: Not on file   Number of children: Not on file   Years of education: Not on file   Highest education level: Not on file  Occupational History   Not on file  Tobacco Use   Smoking status: Never   Smokeless tobacco: Never  Vaping Use   Vaping Use: Never used  Substance and Sexual Activity   Alcohol use: Never    Comment: rarely   Drug use: Never   Sexual activity: Yes   Other Topics Concern   Not on file  Social History Narrative   Not on file   Social Determinants of Health   Financial Resource Strain: Not on file  Food Insecurity: Not on file  Transportation Needs: Not on file  Physical Activity: Not on file  Stress: Not on file  Social Connections: Not on file     Family History: The patient's family history includes Angina in his father; Asthma in his father; Colon polyps in his father; Diabetes in his mother; Heart Problems in his father and mother; Heart failure in his father and mother; Hyperlipidemia in his father and mother; Hypertension in his mother; Hypothyroidism in his mother; Other in his father. There is no history of Colon cancer, Esophageal cancer, Stomach cancer, or Rectal cancer.  ROS:   Please see the history of present illness.    Review of Systems  Constitutional:  Positive for malaise/fatigue. Negative for chills and fever.  HENT:  Negative for hearing loss.   Eyes:  Negative for blurred vision and redness.  Respiratory:  Negative for shortness of breath.   Cardiovascular:  Positive for leg swelling. Negative for chest pain, palpitations, orthopnea and claudication.  Gastrointestinal:  Negative for heartburn, melena, nausea and vomiting.  Genitourinary:  Negative for hematuria.  Musculoskeletal:  Positive for back pain and joint pain. Negative for falls.  Neurological:  Negative for dizziness and loss of consciousness.  Psychiatric/Behavioral:  Negative for substance abuse.     EKGs/Labs/Other Studies Reviewed:    The following studies were reviewed today:  MRA Aorta 12/2021: IMPRESSION: 1. Limited examination in the absence of intravenous gadolinium contrast and due to motion related artifact. 2. Mild aneurysmal dilation of the ascending thoracic aorta measuring up to 4.3 cm. Recommend annual imaging followup by CTA or MRA. This recommendation follows 2010 ACCF/AHA/AATS/ACR/ASA/SCA/SCAI/SIR/STS/SVM Guidelines  for the Diagnosis and Management of Patients with Thoracic Aortic Disease. Circulation. 2010; 121: F621-H086. Aortic aneurysm NOS (ICD10-I71.9) 3. Cardiomegaly with right heart enlargement. 4. Probable bilateral simple renal cysts.  Ca score 10/2021: IMPRESSION: 1. Coronary calcium score of 0. 2. Dilated ascending aorta to 45 mm at the level of the main PA bifurcation (non-contrast). Consider dedicated contrast CT of the aorta to fully evaluate.    EKG:  EKG is personally reviewed. NSR with HR 60bpm  Recent Labs: 12/13/2021: BUN 36; Potassium 4.5; Sodium 143 10/17/2022: Creatinine, Ser 2.30   Recent Lipid Panel No results found for: "CHOL", "TRIG", "HDL", "CHOLHDL", "VLDL", "LDLCALC", "LDLDIRECT"   Physical Exam:    VS:  BP 130/82   Pulse (!) 59   Ht 5\' 9"  (1.753 m)   Wt 265 lb 8 oz (120.4 kg)   SpO2 99%  BMI 39.21 kg/m     Wt Readings from Last 3 Encounters:  12/12/22 265 lb 8 oz (120.4 kg)  06/15/22 278 lb 12.8 oz (126.5 kg)  03/07/22 270 lb (122.5 kg)     GEN:  Well nourished, well developed in no acute distress HEENT: Normal NECK: No JVD; No carotid bruits CARDIAC: RRR, no murmurs RESPIRATORY:  Clear to auscultation without rales, wheezing or rhonchi  ABDOMEN: Soft, non-tender, non-distended MUSCULOSKELETAL:  No edema; No deformity  SKIN: Warm and dry; Varicosities of bilateral LE NEUROLOGIC:  Alert and oriented x 3 PSYCHIATRIC:  Normal affect   ASSESSMENT:    1. Atypical chest pain   2. Mixed hyperlipidemia   3. OSA (obstructive sleep apnea)   4. Essential hypertension   5. Ascending aorta dilatation (HCC)   6. Stage 3 chronic kidney disease, unspecified whether stage 3a or 3b CKD (HCC)      PLAN:    In order of problems listed above:  #Non-Cardiac Chest Pain: #GERD: Patient with substernal chest burning and pressure that occurs after eating a heavy meal especially when he lays down after a heavy meal. No exertional symptoms. Able to bike  every other day and walk 2-3 miles 3-4x/week with his dog without issues. Symptoms improved with resumption of PPI  #HTN: Well controlled at home. -Continue metop succinate 25mg  XL daily -Continue olmesartan-HCTZ 20-12.5mg   #HLD: #Family history of CAD: -Did not tolerate statins -Continue zetia 10mg  daily -Ca score 10/2021 0  #Dilated Ascending Aorta: Measures 4.3cm on MRA in 12/2021. -Continue yearly surveillance with MRA or TTE -Did not tolerate statins -Continue zetia 10mg  daily -Continue ASA 81mg  daily -Discussed lifting restraints today  #Suspected OSA: Patient with episodes of snoring and wakes up frequently in the middle of the night. Concerned he may have OSA, but has not yet scheduled his sleep study. Encouraged him to do so.  #History of Carotid Stenosis: -No significant narrowing in 01/2022  #CKD IIIB: -Refer to Nephrology  Follow-up:  6 months.   Medication Adjustments/Labs and Tests Ordered: Current medicines are reviewed at length with the patient today.  Concerns regarding medicines are outlined above.   Orders Placed This Encounter  Procedures   Ambulatory referral to Nephrology   EKG 12-Lead   No orders of the defined types were placed in this encounter.  Patient Instructions  Medication Instructions:  Your physician recommends that you continue on your current medications as directed. Please refer to the Current Medication list given to you today.  *If you need a refill on your cardiac medications before your next appointment, please call your pharmacy*   Lab Work: NONE If you have labs (blood work) drawn today and your tests are completely normal, you will receive your results only by: MyChart Message (if you have MyChart) OR A paper copy in the mail If you have any lab test that is abnormal or we need to change your treatment, we will call you to review the results.      Follow-Up: At [facility], you and your health needs are  our priority.  As part of our continuing mission to provide you with exceptional heart care, we have created designated Provider Care Teams.  These Care Teams include your primary Cardiologist (physician) and Advanced Practice Providers (APPs -  Physician Assistants and Nurse Practitioners) who all work together to provide you with the care you need, when you need it.  We recommend signing up for the patient portal called "MyChart".  Sign up information is provided on this After Visit Summary.  MyChart is used to connect with patients for Virtual Visits (Telemedicine).  Patients are able to view lab/test results, encounter notes, upcoming appointments, etc.  Non-urgent messages can be sent to your provider as well.   To learn more about what you can do with MyChart, go to [email or web address].    Your next appointment:   6 month(s)  Provider:   Meriam Sprague, MD     Other Instructions You have been referred to [facility]    Signed, Meriam Sprague, MD  12/12/2022 10:47 AM    [facility]

## 2022-12-12 ENCOUNTER — Ambulatory Visit: Payer: Medicare Other | Attending: Cardiology | Admitting: Cardiology

## 2022-12-12 ENCOUNTER — Encounter: Payer: Self-pay | Admitting: Cardiology

## 2022-12-12 VITALS — BP 130/82 | HR 59 | Ht 69.0 in | Wt 265.5 lb

## 2022-12-12 DIAGNOSIS — I77810 Thoracic aortic ectasia: Secondary | ICD-10-CM

## 2022-12-12 DIAGNOSIS — G4733 Obstructive sleep apnea (adult) (pediatric): Secondary | ICD-10-CM

## 2022-12-12 DIAGNOSIS — E782 Mixed hyperlipidemia: Secondary | ICD-10-CM

## 2022-12-12 DIAGNOSIS — R0789 Other chest pain: Secondary | ICD-10-CM | POA: Diagnosis not present

## 2022-12-12 DIAGNOSIS — I10 Essential (primary) hypertension: Secondary | ICD-10-CM

## 2022-12-12 DIAGNOSIS — N183 Chronic kidney disease, stage 3 unspecified: Secondary | ICD-10-CM

## 2022-12-12 NOTE — Patient Instructions (Signed)
Medication Instructions:  Your physician recommends that you continue on your current medications as directed. Please refer to the Current Medication list given to you today.  *If you need a refill on your cardiac medications before your next appointment, please call your pharmacy*   Lab Work: NONE If you have labs (blood work) drawn today and your tests are completely normal, you will receive your results only by: MyChart Message (if you have MyChart) OR A paper copy in the mail If you have any lab test that is abnormal or we need to change your treatment, we will call you to review the results.      Follow-Up: At [facility], you and your health needs are our priority.  As part of our continuing mission to provide you with exceptional heart care, we have created designated Provider Care Teams.  These Care Teams include your primary Cardiologist (physician) and Advanced Practice Providers (APPs -  Physician Assistants and Nurse Practitioners) who all work together to provide you with the care you need, when you need it.  We recommend signing up for the patient portal called "MyChart".  Sign up information is provided on this After Visit Summary.  MyChart is used to connect with patients for Virtual Visits (Telemedicine).  Patients are able to view lab/test results, encounter notes, upcoming appointments, etc.  Non-urgent messages can be sent to your provider as well.   To learn more about what you can do with MyChart, go to [email or web address].    Your next appointment:   6 month(s)  Provider:   Meriam Sprague, MD     Other Instructions You have been referred to [facility]

## 2022-12-17 DIAGNOSIS — M25562 Pain in left knee: Secondary | ICD-10-CM | POA: Diagnosis not present

## 2022-12-17 DIAGNOSIS — H1045 Other chronic allergic conjunctivitis: Secondary | ICD-10-CM | POA: Diagnosis not present

## 2022-12-17 DIAGNOSIS — H2513 Age-related nuclear cataract, bilateral: Secondary | ICD-10-CM | POA: Diagnosis not present

## 2022-12-17 DIAGNOSIS — H43813 Vitreous degeneration, bilateral: Secondary | ICD-10-CM | POA: Diagnosis not present

## 2022-12-24 ENCOUNTER — Ambulatory Visit: Payer: Medicare Other

## 2022-12-25 ENCOUNTER — Ambulatory Visit: Payer: Medicare Other | Attending: Podiatry | Admitting: Physical Therapy

## 2022-12-25 ENCOUNTER — Other Ambulatory Visit: Payer: Self-pay

## 2022-12-25 ENCOUNTER — Encounter: Payer: Self-pay | Admitting: Physical Therapy

## 2022-12-25 DIAGNOSIS — R2681 Unsteadiness on feet: Secondary | ICD-10-CM | POA: Insufficient documentation

## 2022-12-25 DIAGNOSIS — R269 Unspecified abnormalities of gait and mobility: Secondary | ICD-10-CM | POA: Insufficient documentation

## 2022-12-25 DIAGNOSIS — R2689 Other abnormalities of gait and mobility: Secondary | ICD-10-CM | POA: Diagnosis not present

## 2022-12-25 DIAGNOSIS — M6281 Muscle weakness (generalized): Secondary | ICD-10-CM | POA: Insufficient documentation

## 2022-12-25 NOTE — Therapy (Signed)
OUTPATIENT PHYSICAL THERAPY NEURO EVALUATION   Patient Name: Garrod Scalzitti MRN: [id number] DOB:04/16/1956, 67 y.o., male Today's Date: 12/25/2022   END OF SESSION:  PT End of Session - 12/25/22 1445     Visit Number 1    Date for PT Re-Evaluation 02/19/23    Authorization Type Medicare & [employer]    Progress Note Due on Visit 10    PT Start Time 1445    PT Stop Time 1532    PT Time Calculation (min) 47 min    Activity Tolerance Patient tolerated treatment well    Behavior During Therapy WFL for tasks assessed/performed             Past Medical History:  Diagnosis Date   Abdominal aortic aneurysm (AAA) 3.0 cm to 5.5 cm in diameter in male (HCC) 03/07/2022   PER PATIENT   Arthritis    Chest pain on exertion    Chronic kidney disease, stage 3a (HCC)    GERD (gastroesophageal reflux disease)    History of Helicobacter pylori infection    in [place]   History of transient ischemic attack (TIA)    per patient   Hyperlipemia    Hypertension    Hypertensive kidney disease with CKD (chronic kidney disease) stage I    Impaired fasting glucose    Obesity    Radiculopathy, lumbar region    Past Surgical History:  Procedure Laterality Date   MOUTH SURGERY  2000   Patient Active Problem List   Diagnosis Date Noted   Dyslipidemia, goal LDL below 70 07/27/2022   Lumbar radiculopathy 04/05/2021   Pain in left foot 09/17/2019   Degeneration of lumbar intervertebral disc 11/17/2018   Epidural lipomatosis 11/17/2018   Lumbar disc herniation with radiculopathy 11/06/2018    PCP: Geoffry Paradise, MD   REFERRING PROVIDER: Edwin Cap, DPM   REFERRING DIAG: R26.9 (ICD-10-CM) - Neurologic gait dysfunction  Evaluate and treat for 1-2 sessions / week for 4-6 weeks or at therapist's discretion. Patient has gait dysfunction and balance problems, would like to include ROM, strengthening, stability, and gait training, mobility and balance. Modalities PRN at therapist's  discretion.   THERAPY DIAG:  Other abnormalities of gait and mobility  Unsteadiness on feet  Muscle weakness (generalized)  RATIONALE FOR EVALUATION AND TREATMENT: Rehabilitation  ONSET DATE: 4-6 yrs  NEXT MD VISIT: 01/17/23   SUBJECTIVE:  SUBJECTIVE STATEMENT: Pt reports an unsteady gait which he attributes to partially due to his knees, partially due to his feet and partially his back. Back pain has been off and on for 4 yrs, and knees off and on for 6 yrs. He reports no falls but a few near misses. Most prevalent on declines or stairs. Rides stationary bike daily for exercise.  PAIN: Are you having pain? Yes: NPRS scale: 3-4/10 Pain location: B knees and lower legs Pain description: ache & tight feeling with intermittent sciatic feeling Aggravating factors: prolonged standing or walking Relieving factors: sitting down for limited periods  PERTINENT HISTORY:  AAA, chest pain on exertion, TIA, HTN, CKD, lumbar DDD/HNP with radiculopathy, obesity  PRECAUTIONS: Fall  WEIGHT BEARING RESTRICTIONS: No  FALLS:  Has patient fallen in last 6 months? 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, light lifting)  PLOF: Independent and Leisure: Rec bike x 30 min every morning, light free weights circuit training, TM occasionally  PATIENT GOALS: "better balance, esp on declines and stairs"   OBJECTIVE: (objective measures completed at initial evaluation unless otherwise dated)  DIAGNOSTIC FINDINGS:  No imaging available but OA in  knees & HNP in back per pt  COGNITION: Overall cognitive status: Within functional limits for tasks assessed   SENSATION: WFL, occasional R>L sciatica  COORDINATION: Slow B heel-toe rock with limited heel lift Impaired heel to shin bil  EDEMA:  Mild to moderate B distal LE edema  MUSCLE LENGTH: Hamstrings: mod tight R>L ITB: NT Piriformis: mod tight R>L Hip flexors: mod tight B Quads: mod tight R>L Heelcord: mod tight R>L  POSTURE:  Wide BOS with B genu recurvatum  LOWER EXTREMITY ROM:    LE ROM limited by tightness as above and B knee OA Active  Right Eval Left Eval  Hip flexion    Hip extension    Hip abduction    Hip adduction    Hip internal rotation    Hip external rotation    Knee flexion    Knee extension    Ankle dorsiflexion    Ankle plantarflexion    Ankle inversion    Ankle eversion     (Blank rows = not tested)  LOWER EXTREMITY MMT:    MMT Right Eval Left Eval  Hip flexion 4- 4-  Hip extension 3- 2+  Hip abduction 2 3-  Hip adduction 2 2  Hip internal rotation 3+ 4-  Hip external rotation 3+ 3+  Knee flexion 4- 4  Knee extension 4- 4-  Ankle dorsiflexion 4- 4-  Ankle plantarflexion 3+ NWB 4- NWB  Ankle inversion    Ankle eversion    (Blank rows = not tested)  BED MOBILITY:  Difficulty with rolling on mat table but assist from PT not needed  TRANSFERS: Assistive device utilized: None  Sit to stand: Modified independence - requires B UE assist Stand to sit: Modified independence Chair to chair:  NT Floor:  NT  GAIT: Distance walked: 60 ft Assistive device utilized: None Level of assistance: SBA Gait pattern: decreased stride length, decreased hip/knee flexion- Right, decreased hip/knee flexion- Left, genu recurvatum- Right, genu recurvatum- Left, decreased trunk rotation, and wide BOS Comments: 2.72 ft/sec gait speed  RAMP: Level of Assistance:  NT Assistive device utilized:  NT Ramp Comments:  pt reports difficulty with  ambulating on declines  CURB:  Level of Assistance:  NT Assistive device utilized:  NT Curb Comments:   STAIRS:  Level of Assistance:  TBA next visit  Stair Negotiation Technique:   Number of Stairs:    Height of Stairs:   Comments:   FUNCTIONAL TESTS:  5 times sit to stand: 20.41 sec - B UE assist necessary Timed up and go (TUG): 11.28 sec 10 meter walk test: 12.06 sec; Gait speed = 2.72 ft/sec Berg Balance Scale: TBA next visit Functional gait assessment: TBA next visit  PATIENT SURVEYS:  ABC scale 790 / 1600 = 49.4 %   TODAY'S TREATMENT:   12/24/22 Eval only   PATIENT EDUCATION:  Education details: PT eval findings, anticipated POC, and need for further assessment of static and dynamic balance   Person educated: Patient Education method: Explanation Education comprehension: verbalized understanding  HOME EXERCISE PROGRAM: TBD   ASSESSMENT:  CLINICAL IMPRESSION: Ethaniel Stuver is a 67 y.o. male who was seen today for physical therapy evaluation and treatment for neurologic gait dysfunction. He reports gait instability and balance issues which he attributes to issues with his back (h/o HNP with B sciatica), knees (OA) and feet.  He reports intermittent B knee and LE pain, along with occasional LBP with bilateral sciatica.  Current deficits include pain, postural abnormalities, abnormal gait, decreased LE ROM and flexibility, decreased coordination, and significant B LE weakness.  Balance testing initiated with increased risk for falls identified per 5xSTS along with decreased gait speed, but further testing will be completed next visit.  Hobert will benefit from skilled PT to address above deficits to improve mobility and activity tolerance with decreased pain interference.   OBJECTIVE IMPAIRMENTS: Abnormal gait, decreased activity tolerance, decreased balance, decreased coordination, decreased endurance, decreased knowledge of condition, decreased mobility, difficulty  walking, decreased ROM, decreased strength, increased edema, increased fascial restrictions, impaired perceived functional ability, increased muscle spasms, impaired flexibility, impaired sensation, improper body mechanics, postural dysfunction, and pain.   ACTIVITY LIMITATIONS: carrying, lifting, bending, standing, squatting, stairs, transfers, bed mobility, locomotion level, and caring for others  PARTICIPATION LIMITATIONS: meal prep, cleaning, laundry, shopping, community activity, and occupation  PERSONAL FACTORS: Past/current experiences, Time since onset of injury/illness/exacerbation, and 3+ comorbidities: AAA, chest pain on exertion, TIA, HTN, CKD, lumbar DDD/HNP with radiculopathy, obesity  are also affecting patient's functional outcome.   REHAB POTENTIAL: Good  CLINICAL DECISION MAKING: Evolving/moderate complexity  EVALUATION COMPLEXITY: Moderate   GOALS: Goals reviewed with patient? Yes  SHORT TERM GOALS: Target date: 01/22/2023   Patient will be independent with initial HEP to improve outcomes and carryover.  Baseline:  Goal status: INITIAL  2.  Patient will be educated on strategies to decrease risk of falls.  Baseline:  Goal status: INITIAL  3.  Patient will demonstrate decreased 5xSTS time to </= 15 sec to decrease risk for falls with transitional mobility and for improved efficiency and safety with transfers. Baseline: 20.41 sec with B UE assist required Goal status: INITIAL  LONG TERM GOALS: Target date: 02/19/2023   Patient will be independent with advanced/ongoing HEP to facilitate ability to maintain/progress functional gains from skilled physical therapy services. Baseline:  Goal status: INITIAL  2.  Patient will be able to ambulate 600' with or w/o LRAD on variable surfaces with good safety to access community.  Baseline:  Goal status: INITIAL  3.  Patient will be able to step up/down curb safely with LRAD for safety  with community ambulation.   Baseline:  Goal status: INITIAL   4.  Patient will demonstrate improved B LE strength to >/= 4 to 4+/5 for improved stability and ease of mobility. Baseline: Refer to above MMT table Goal status: INITIAL  5.  Patient will improve 5x STS time to </= 13.6 seconds w/o need for UE assist for improved efficiency and safety with transfers Baseline: 20.41 sec with B UE assist required Goal status: INITIAL   6.  Patient will demonstrate gait speed of >/= 3.54 ft/sec to be a safe community ambulator with decreased risk for recurrent falls.  Baseline: 2.72 ft/sec Goal status: INITIAL  7.  Patient will improve Berg score by >/= 8 points to improve safety and stability with ADLs in standing and reduce risk for falls.   Baseline: TBD Goal status: INITIAL  8. Patient will improve FGA score to at least 19/30 to improve gait stability and reduce risk for falls. Baseline: TBD Goal status: INITIAL  9.  Patient will report >/= 68% on ABC scale to demonstrate improved functional ability. Baseline: 790 / 1600 = 49.4 % Goal status: INITIAL   PLAN:  PT FREQUENCY: 2x/week  PT DURATION: 8 weeks  PLANNED INTERVENTIONS: Therapeutic exercises, Therapeutic activity, Neuromuscular re-education, Balance training, Gait training, Patient/Family education, Self Care, Joint mobilization, Stair training, DME instructions, Aquatic Therapy, Dry Needling, Electrical stimulation, Spinal mobilization, Cryotherapy, Moist heat, Taping, Vasopneumatic device, Traction, Ultrasound, Ionotophoresis 4mg /ml Dexamethasone, Manual therapy, and Re-evaluation  PLAN FOR NEXT SESSION: Complete initial assessment - B knee AROM, balance testing with Sharlene Motts & FGA; create initial HEP for ROM/flexibility and strengthening   Marry Guan, PT 12/25/2022, 5:52 PM

## 2022-12-27 ENCOUNTER — Encounter: Payer: Self-pay | Admitting: Physical Therapy

## 2022-12-27 ENCOUNTER — Ambulatory Visit: Payer: Medicare Other | Admitting: Physical Therapy

## 2022-12-27 DIAGNOSIS — R269 Unspecified abnormalities of gait and mobility: Secondary | ICD-10-CM | POA: Diagnosis not present

## 2022-12-27 DIAGNOSIS — R2689 Other abnormalities of gait and mobility: Secondary | ICD-10-CM | POA: Diagnosis not present

## 2022-12-27 DIAGNOSIS — R2681 Unsteadiness on feet: Secondary | ICD-10-CM | POA: Diagnosis not present

## 2022-12-27 DIAGNOSIS — M6281 Muscle weakness (generalized): Secondary | ICD-10-CM

## 2022-12-27 NOTE — Therapy (Signed)
OUTPATIENT PHYSICAL THERAPY TREATMENT   Patient Name: Joseph Compton MRN: [id number] DOB:[birth year], 67 y.o., male Today's Date: 12/27/2022   END OF SESSION:  PT End of Session - 12/27/22 1422     Visit Number 2    Date for PT Re-Evaluation 02/19/23    Authorization Type Medicare & [employer]    Progress Note Due on Visit 10    PT Start Time 1422    PT Stop Time 1517    PT Time Calculation (min) 55 min    Activity Tolerance Patient tolerated treatment well    Behavior During Therapy WFL for tasks assessed/performed             Past Medical History:  Diagnosis Date   Abdominal aortic aneurysm (AAA) 3.0 cm to 5.5 cm in diameter in male (HCC) 03/07/2022   PER PATIENT   Arthritis    Chest pain on exertion    Chronic kidney disease, stage 3a (HCC)    GERD (gastroesophageal reflux disease)    History of Helicobacter pylori infection    in [place]   History of transient ischemic attack (TIA)    per patient   Hyperlipemia    Hypertension    Hypertensive kidney disease with CKD (chronic kidney disease) stage I    Impaired fasting glucose    Obesity    Radiculopathy, lumbar region    Past Surgical History:  Procedure Laterality Date   MOUTH SURGERY  2000   Patient Active Problem List   Diagnosis Date Noted   Dyslipidemia, goal LDL below 70 07/27/2022   Lumbar radiculopathy 04/05/2021   Pain in left foot 09/17/2019   Degeneration of lumbar intervertebral disc 11/17/2018   Epidural lipomatosis 11/17/2018   Lumbar disc herniation with radiculopathy 11/06/2018    PCP: Geoffry Paradise, MD   REFERRING PROVIDER: Edwin Cap, DPM   REFERRING DIAG: R26.9 (ICD-10-CM) - Neurologic gait dysfunction  Evaluate and treat for 1-2 sessions / week for 4-6 weeks or at therapist's discretion. Patient has gait dysfunction and balance problems, would like to include ROM, strengthening, stability, and gait training, mobility and balance. Modalities PRN at therapist's  discretion.   THERAPY DIAG:  Other abnormalities of gait and mobility  Unsteadiness on feet  Muscle weakness (generalized)  RATIONALE FOR EVALUATION AND TREATMENT: Rehabilitation  ONSET DATE: 4-6 yrs  NEXT MD VISIT: 01/17/23   SUBJECTIVE:  SUBJECTIVE STATEMENT: Pt reports his knees are bothering him a little more today.  PAIN: Are you having pain? Yes: NPRS scale:  5/10 Pain location: B knees Pain description: ache & tight feeling with intermittent sciatic feeling Aggravating factors: prolonged standing or walking Relieving factors: sitting down for limited periods  PERTINENT HISTORY:  AAA, chest pain on exertion, TIA, HTN, CKD, lumbar DDD/HNP with radiculopathy, obesity  PRECAUTIONS: Fall  WEIGHT BEARING RESTRICTIONS: No  FALLS:  Has patient fallen in last 6 months? 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, light lifting)  PLOF: Independent and Leisure: Rec bike x 30 min every morning, light free weights circuit training, TM occasionally  PATIENT GOALS: "better balance, esp on declines and stairs"   OBJECTIVE: (objective measures completed at initial evaluation unless otherwise dated)  DIAGNOSTIC FINDINGS:  No imaging available but OA in knees & HNP in back per pt  COGNITION: Overall cognitive status: Within functional limits for tasks assessed   SENSATION: WFL, occasional R>L sciatica  COORDINATION: Slow B heel-toe rock with limited heel lift Impaired heel to shin bil  EDEMA:  Mild to moderate B  distal LE edema  MUSCLE LENGTH: Hamstrings: mod tight R>L ITB: NT Piriformis: mod tight R>L Hip flexors: mod tight B Quads: mod tight R>L Heelcord: mod tight R>L  POSTURE:  Wide BOS with B genu recurvatum, R lateral shift  LOWER EXTREMITY ROM:    LE ROM limited by tightness as above and B knee OA Active  Right Eval Left Eval  Hip flexion    Hip extension    Hip abduction    Hip adduction    Hip internal rotation    Hip external rotation    Knee flexion 119 124  Knee extension 9 2  Ankle dorsiflexion    Ankle plantarflexion    Ankle inversion    Ankle eversion     (Blank rows = not tested)  LOWER EXTREMITY MMT:    MMT Right Eval Left Eval  Hip flexion 4- 4-  Hip extension 3- 2+  Hip abduction 2 3-  Hip adduction 2 2  Hip internal rotation 3+ 4-  Hip external rotation 3+ 3+  Knee flexion 4- 4  Knee extension 4- 4-  Ankle dorsiflexion 4- 4-  Ankle plantarflexion 3+ NWB 4- NWB  Ankle inversion    Ankle eversion    (Blank rows = not tested)  BED MOBILITY:  Difficulty with rolling on mat table but assist from PT not needed  TRANSFERS: Assistive device utilized: None  Sit to stand: Modified independence - requires B UE assist Stand to sit: Modified independence Chair to chair:  NT Floor:  NT  GAIT: Distance walked: 60 ft Assistive device utilized: None Level of assistance: SBA Gait pattern: decreased stride length, decreased hip/knee flexion- Right, decreased hip/knee flexion- Left, genu recurvatum- Right, genu recurvatum- Left, decreased trunk rotation, and wide BOS Comments: 2.72 ft/sec gait speed  RAMP: Level of Assistance:  NT Assistive device utilized:  NT Ramp Comments:  pt reports difficulty with ambulating on declines  CURB:  Level of Assistance:  NT Assistive device utilized:  NT Curb Comments:   STAIRS: (12/27/22)  Level of Assistance: SBA  Stair Negotiation Technique: Step to Pattern Forwards on ascent, Backwards on descent  with  Bilateral Rails Number of Stairs: 4  Height of Stairs: 7"  Comments: pt very hesitant with steps due to R>L knee  FUNCTIONAL TESTS:  5 times sit to stand: 20.41 sec - B UE assist necessary Timed up and go (TUG): 11.28 sec 10 meter walk test: 12.06 sec; Gait speed = 2.72 ft/sec Berg Balance Scale: 12/27/22: 39/56; 37-45 = Significant (>80%) fall risk  Functional gait assessment: 12/27/22: 14/30; < 19 = high risk fall  PATIENT SURVEYS:  ABC scale 790 / 1600 = 49.4 %   TODAY'S TREATMENT:   12/27/22 THERAPEUTIC ACTIVITIES: Sharlene Motts: 39/56; 37-45 = Significant (>80%) fall risk  FGA: 14/30; < 19 = high risk fall   THERAPEUTIC EXERCISE: to improve flexibility, strength and mobility.  Verbal and tactile cues throughout for technique.  Seated hip hinge HS stretch x 30" bil Side-sitting figure-4 piriformis stretch x 30" Seated lunge position hip flexor stretch x 30" Standing church pews x 20 - cues to avoid knee hyperextension Heel/toes raises x 10 Standing counter minisquat x 10 - cues to maintain upright torso and limit squat depth to pain free range Standing R/L hip ABD x 10, UE support on counter Standing R/L hip ABD x 10, UE support on counter   12/24/22 Eval only   PATIENT EDUCATION:  Education details: PT eval findings and initial HEP  Person educated: Patient Education method: Explanation, Demonstration, Verbal cues, Handouts, and Medbridge[employer] access code texted to pt Education comprehension: verbalized understanding, returned demonstration, verbal cues required, and needs further education  HOME EXERCISE PROGRAM: *Access Code: [email or web address] URL: [email or web address] Date: 12/27/2022 Prepared by: Glenetta Hew  Exercises - Seated Hamstring Stretch  - 2 x daily - 7 x weekly - 3 reps - 30 sec hold - Seated Table Piriformis Stretch  - 2 x daily - 7 x weekly - 3 reps - 30 sec hold - Seated Hip Flexor Stretch  - 2 x daily - 7 x weekly - 3 reps - 30 sec hold - Church Pew   - 1 x daily - 7 x weekly - 2 sets - 10 reps - 3 sec hold - Heel Toe Raises with Counter Support  - 1 x daily - 7 x weekly - 2 sets - 10 reps - 3 sec hold - Mini Squat with Counter Support  - 1 x daily - 7 x weekly - 2 sets - 10 reps - 3-5 sec hold - Standing Hip Abduction with Counter Support  - 1 x daily - 7 x weekly - 2 sets - 10 reps - 2-3 sec hold - Standing Hip Extension with Counter Support  - 1 x daily - 7 x weekly - 2 sets - 10 reps - 2-3 sec hold  *MedBridgeGO access code texted to patient   ASSESSMENT:  CLINICAL IMPRESSION: Completed balance assessment with Berg and FGA both indicating significant to high fall risk. Created initial HEP consisting of stretches to improve flexibility and ROM, and strengthening to increased quad activation and proximal LE stability. Pt cautioned to avoid pushing any stretches or exercises into painful movements and to let PT know next visit if any issues noted on home performance. Ben will continue to benefit from further progression of stretches as well as lumbopelvic and LE strengthening to promote improved stability and activity tolerance with eventual progression to balance related activities to reduce fall risk.  OBJECTIVE IMPAIRMENTS: Abnormal gait, decreased activity tolerance, decreased balance, decreased coordination, decreased endurance, decreased knowledge of condition, decreased mobility, difficulty walking, decreased  ROM, decreased strength, increased edema, increased fascial restrictions, impaired perceived functional ability, increased muscle spasms, impaired flexibility, impaired sensation, improper body mechanics, postural dysfunction, and pain.   ACTIVITY LIMITATIONS: carrying, lifting, bending, standing, squatting, stairs, transfers, bed mobility, locomotion level, and caring for others  PARTICIPATION LIMITATIONS: meal prep, cleaning, laundry, shopping, community activity, and occupation  PERSONAL FACTORS: Past/current experiences,  Time since onset of injury/illness/exacerbation, and 3+ comorbidities: AAA, chest pain on exertion, TIA, HTN, CKD, lumbar DDD/HNP with radiculopathy, obesity  are also affecting patient's functional outcome.   REHAB POTENTIAL: Good  CLINICAL DECISION MAKING: Evolving/moderate complexity  EVALUATION COMPLEXITY: Moderate   GOALS: Goals reviewed with patient? Yes  SHORT TERM GOALS: Target date: 01/22/2023   Patient will be independent with initial HEP to improve outcomes and carryover.  Baseline:  Goal status: IN PROGRESS  2.  Patient will be educated on strategies to decrease risk of falls.  Baseline:  Goal status: IN PROGRESS  3.  Patient will demonstrate decreased 5xSTS time to </= 15 sec to decrease risk for falls with transitional mobility and for improved efficiency and safety with transfers. Baseline: 20.41 sec with B UE assist required Goal status: IN PROGRESS  LONG TERM GOALS: Target date: 02/19/2023   Patient will be independent with advanced/ongoing HEP to facilitate ability to maintain/progress functional gains from skilled physical therapy services. Baseline:  Goal status: IN PROGRESS  2.  Patient will be able to ambulate 600' with or w/o LRAD on variable surfaces with good safety to access community.  Baseline:  Goal status: IN PROGRESS  3.  Patient will be able to step up/down curb safely with LRAD for safety with community ambulation.  Baseline:  Goal status: IN PROGRESS   4.  Patient will demonstrate improved B LE strength to >/= 4 to 4+/5 for improved stability and ease of mobility. Baseline: Refer to above MMT table Goal status: IN PROGRESS  5.  Patient will improve 5x STS time to </= 13.6 seconds w/o need for UE assist for improved efficiency and safety with transfers Baseline: 20.41 sec with B UE assist required Goal status: IN PROGRESS   6.  Patient will demonstrate gait speed of >/= 3.54 ft/sec to be a safe community ambulator with decreased risk for  recurrent falls.  Baseline: 2.72 ft/sec Goal status: IN PROGRESS  7.  Patient will improve Berg score by >/= 8 points to improve safety and stability with ADLs in standing and reduce risk for falls.   Baseline: 39/56 (12/27/22) Goal status: IN PROGRESS  8. Patient will improve FGA score to at least 19/30 to improve gait stability and reduce risk for falls. Baseline: 14/30 (12/27/22) Goal status: IN PROGRESS  9.  Patient will report >/= 68% on ABC scale to demonstrate improved functional ability. Baseline: 790 / 1600 = 49.4 % Goal status: IN PROGRESS   PLAN:  PT FREQUENCY: 2x/week  PT DURATION: 8 weeks  PLANNED INTERVENTIONS: Therapeutic exercises, Therapeutic activity, Neuromuscular re-education, Balance training, Gait training, Patient/Family education, Self Care, Joint mobilization, Stair training, DME instructions, Aquatic Therapy, Dry Needling, Electrical stimulation, Spinal mobilization, Cryotherapy, Moist heat, Taping, Vasopneumatic device, Traction, Ultrasound, Ionotophoresis 4mg /ml Dexamethasone, Manual therapy, and Re-evaluation  PLAN FOR NEXT SESSION: Review HEP; progress ROM/flexibility along with lumbopelvic and LE strengthening, esp quad control in standing avoid genu recurvatum   Marry Guan, PT 12/27/2022, 3:39 PM

## 2022-12-28 ENCOUNTER — Ambulatory Visit (HOSPITAL_COMMUNITY)
Admission: RE | Admit: 2022-12-28 | Discharge: 2022-12-28 | Disposition: A | Discharge status: Home / Self Care | Payer: Medicare Other | Source: Ambulatory Visit | Attending: Cardiology | Admitting: Cardiology

## 2022-12-28 ENCOUNTER — Other Ambulatory Visit: Payer: Self-pay

## 2022-12-28 ENCOUNTER — Other Ambulatory Visit: Payer: Self-pay | Admitting: Cardiology

## 2022-12-28 DIAGNOSIS — I77810 Thoracic aortic ectasia: Secondary | ICD-10-CM | POA: Diagnosis not present

## 2022-12-28 DIAGNOSIS — G4733 Obstructive sleep apnea (adult) (pediatric): Secondary | ICD-10-CM

## 2022-12-28 DIAGNOSIS — I779 Disorder of arteries and arterioles, unspecified: Secondary | ICD-10-CM

## 2022-12-28 DIAGNOSIS — R0683 Snoring: Secondary | ICD-10-CM

## 2022-12-28 DIAGNOSIS — I712 Thoracic aortic aneurysm, without rupture, unspecified: Secondary | ICD-10-CM | POA: Diagnosis not present

## 2022-12-28 MED ORDER — METOPROLOL SUCCINATE ER 25 MG PO TB24: 25.0000 mg | ORAL_TABLET | Freq: Every day | ORAL | 3 refills | Status: DC

## 2022-12-31 ENCOUNTER — Other Ambulatory Visit: Payer: Self-pay

## 2023-01-01 ENCOUNTER — Ambulatory Visit: Payer: Medicare Other | Admitting: Physical Therapy

## 2023-01-01 ENCOUNTER — Encounter: Payer: Self-pay | Admitting: Physical Therapy

## 2023-01-01 DIAGNOSIS — R2681 Unsteadiness on feet: Secondary | ICD-10-CM

## 2023-01-01 DIAGNOSIS — M6281 Muscle weakness (generalized): Secondary | ICD-10-CM

## 2023-01-01 DIAGNOSIS — R269 Unspecified abnormalities of gait and mobility: Secondary | ICD-10-CM | POA: Diagnosis not present

## 2023-01-01 DIAGNOSIS — R2689 Other abnormalities of gait and mobility: Secondary | ICD-10-CM

## 2023-01-01 NOTE — Therapy (Signed)
OUTPATIENT PHYSICAL THERAPY TREATMENT   Patient Name: Joseph Compton MRN: [id number] DOB:March 16, 1956, 67 y.o., male Today's Date: 01/01/2023   END OF SESSION:  PT End of Session - 01/01/23 1445     Visit Number 3    Date for PT Re-Evaluation 02/19/23    Authorization Type Medicare & [employer]    Progress Note Due on Visit 10    PT Start Time 1445    PT Stop Time 1526    PT Time Calculation (min) 41 min    Activity Tolerance Patient tolerated treatment well    Behavior During Therapy WFL for tasks assessed/performed              Past Medical History:  Diagnosis Date   Abdominal aortic aneurysm (AAA) 3.0 cm to 5.5 cm in diameter in male (HCC) 03/07/2022   PER PATIENT   Arthritis    Chest pain on exertion    Chronic kidney disease, stage 3a (HCC)    GERD (gastroesophageal reflux disease)    History of Helicobacter pylori infection    in [place]   History of transient ischemic attack (TIA)    per patient   Hyperlipemia    Hypertension    Hypertensive kidney disease with CKD (chronic kidney disease) stage I    Impaired fasting glucose    Obesity    Radiculopathy, lumbar region    Past Surgical History:  Procedure Laterality Date   MOUTH SURGERY  2000   Patient Active Problem List   Diagnosis Date Noted   Dyslipidemia, goal LDL below 70 07/27/2022   Lumbar radiculopathy 04/05/2021   Pain in left foot 09/17/2019   Degeneration of lumbar intervertebral disc 11/17/2018   Epidural lipomatosis 11/17/2018   Lumbar disc herniation with radiculopathy 11/06/2018    PCP: Geoffry Paradise, MD   REFERRING PROVIDER: Edwin Cap, DPM   REFERRING DIAG: R26.9 (ICD-10-CM) - Neurologic gait dysfunction  Evaluate and treat for 1-2 sessions / week for 4-6 weeks or at therapist's discretion. Patient has gait dysfunction and balance problems, would like to include ROM, strengthening, stability, and gait training, mobility and balance. Modalities PRN at therapist's  discretion.   THERAPY DIAG:  Other abnormalities of gait and mobility  Unsteadiness on feet  Muscle weakness (generalized)  RATIONALE FOR EVALUATION AND TREATMENT: Rehabilitation  ONSET DATE: 4-6 yrs  NEXT MD VISIT: 01/17/23   SUBJECTIVE:  SUBJECTIVE STATEMENT: Pt notes some stiffness in his back but only the R knee is hurting. He notes the heel-toe raises sometimes bothered his feet with the HEP.  PAIN: Are you having pain? Yes: NPRS scale: 3-4/10 Pain location: R knees Pain description: ache & tight feeling with intermittent sciatic feeling Aggravating factors: prolonged standing or walking Relieving factors: sitting down for limited periods  PERTINENT HISTORY:  AAA, chest pain on exertion, TIA, HTN, CKD, lumbar DDD/HNP with radiculopathy, obesity  PRECAUTIONS: Fall  WEIGHT BEARING RESTRICTIONS: No  FALLS:  Has patient fallen in last 6 months? 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, light lifting)  PLOF: Independent and Leisure: Rec bike x 30 min every morning, light free weights circuit training, TM occasionally  PATIENT GOALS: "better balance, esp on declines and stairs"   OBJECTIVE: (objective measures completed at initial evaluation unless otherwise dated)  DIAGNOSTIC FINDINGS:  No imaging available but OA in knees & HNP in back per pt  COGNITION: Overall cognitive status: Within functional limits for tasks assessed   SENSATION: WFL, occasional R>L sciatica  COORDINATION: Slow B heel-toe rock with  limited heel lift Impaired heel to shin bil  EDEMA:  Mild to moderate B distal LE edema  MUSCLE LENGTH: Hamstrings: mod tight R>L ITB: NT Piriformis: mod tight R>L Hip flexors: mod tight B Quads: mod tight R>L Heelcord: mod tight R>L  POSTURE:  Wide BOS with B genu recurvatum, R lateral shift  LOWER EXTREMITY ROM:    LE ROM limited by tightness as above and B knee OA Active  Right Eval Left Eval  Hip flexion    Hip extension    Hip abduction    Hip adduction    Hip internal rotation    Hip external rotation    Knee flexion 119 124  Knee extension 9 2  Ankle dorsiflexion    Ankle plantarflexion    Ankle inversion    Ankle eversion     (Blank rows = not tested)  LOWER EXTREMITY MMT:    MMT Right Eval Left Eval  Hip flexion 4- 4-  Hip extension 3- 2+  Hip abduction 2 3-  Hip adduction 2 2  Hip internal rotation 3+ 4-  Hip external rotation 3+ 3+  Knee flexion 4- 4  Knee extension 4- 4-  Ankle dorsiflexion 4- 4-  Ankle plantarflexion 3+ NWB 4- NWB  Ankle inversion    Ankle eversion    (Blank rows = not tested)  BED MOBILITY:  Difficulty with rolling on mat table but assist from PT not needed  TRANSFERS: Assistive device utilized: None  Sit to stand: Modified independence - requires B UE assist Stand to sit: Modified independence Chair to chair:  NT Floor:  NT  GAIT: Distance walked: 60 ft Assistive device utilized: None Level of assistance: SBA Gait pattern: decreased stride length, decreased hip/knee flexion- Right, decreased hip/knee flexion- Left, genu recurvatum- Right, genu recurvatum- Left, decreased trunk rotation, and wide BOS Comments: 2.72 ft/sec gait speed  RAMP: Level of Assistance:  NT Assistive device utilized:  NT Ramp Comments:  pt reports difficulty with ambulating on declines  CURB:  Level of Assistance:  NT Assistive device utilized:  NT Curb Comments:   STAIRS: (12/27/22)  Level of Assistance: SBA  Stair Negotiation  Technique: Step to Pattern Forwards on ascent, Backwards on descent  with Bilateral Rails Number of Stairs: 4  Height of Stairs: 7"  Comments: pt very hesitant with steps due to R>L knee  FUNCTIONAL TESTS:  5 times sit to stand: 20.41 sec - B UE assist necessary Timed up and go (TUG): 11.28 sec 10 meter walk test: 12.06 sec; Gait speed = 2.72 ft/sec Berg Balance Scale: 12/27/22: 39/56; 37-45 = Significant (>80%) fall risk  Functional gait assessment: 12/27/22: 14/30; < 19 = high risk fall  PATIENT SURVEYS:  ABC scale 790 / 1600 = 49.4 %   TODAY'S TREATMENT:   01/01/23 THERAPEUTIC EXERCISE: to improve flexibility, strength and mobility.  Verbal and tactile cues throughout for technique. Rec Bike - L5 x 6 min Seated hip hinge HS stretch 2 x 30" bil - 2nd rep with foot elevated on 6" step Side-sitting figure-4 piriformis stretch x 30" bil Seated lunge position hip flexor stretch x 30" bil  Seated black TB PF & DF 2 x 10 (in place of standing heel/toe raises as pt reporting increased foot pain in WBing) Standing church pews x 20 - cues to avoid knee hyperextension Standing counter minisquat x 5 - cues to maintain upright torso and limit squat depth to pain free range, but pt still reporting increased back and knee pain therefore deferred from HEP Standing R/L hip ABD x 10, UE support on counter Standing R/L hip ABD x 10, UE support on counter Standing black TB TKE 2 x 10, UE support on back of chair   12/27/22 THERAPEUTIC ACTIVITIES: Sharlene Motts: 39/56; 37-45 = Significant (>80%) fall risk  FGA: 14/30; < 19 = high risk fall   THERAPEUTIC EXERCISE: to improve flexibility, strength and mobility.  Verbal and tactile cues throughout for technique.  Seated hip hinge HS stretch x 30" bil Side-sitting figure-4 piriformis stretch x 30" Seated lunge position hip flexor stretch x 30" Standing church pews x 20 - cues to avoid knee hyperextension Heel/toes raises x 10 Standing counter minisquat x 10  - cues to maintain upright torso and limit squat depth to pain free range Standing R/L hip ABD x 10, UE support on counter Standing R/L hip ABD x 10, UE support on counter   12/24/22 Eval only   PATIENT EDUCATION:  Education details: HEP modification - black TB ankle DF/PF in place of heel/toe raises, black TB TKE in place of mini-squat   Person educated: Patient Education method: Explanation, Demonstration, Verbal cues, Handouts, and MedbridgeGO access code texted to pt Education comprehension: verbalized understanding, returned demonstration, verbal cues required, and needs further education  HOME EXERCISE PROGRAM: *Access Code: [email or web address] URL: [email or web address] Date: 01/01/2023 Prepared by: Glenetta Hew  Exercises - Seated Hamstring Stretch  - 2 x daily - 7 x weekly - 3 reps - 30 sec hold - Seated Table Piriformis Stretch  - 2 x daily - 7 x weekly - 3 reps - 30 sec hold - Seated Hip Flexor Stretch  - 2 x daily - 7 x weekly - 3 reps - 30 sec hold - [employer] [place]  - 1 x daily - 7 x weekly - 2 sets - 10 reps - 3 sec hold - Standing Hip Abduction with Counter Support  - 1 x daily - 7 x weekly - 2 sets - 10 reps - 2-3 sec hold - Standing Hip Extension with Counter Support  - 1 x daily - 7 x weekly - 2 sets - 10 reps -  2-3 sec hold - Seated Ankle Plantar Flexion with Resistance Loop  - 1 x daily - 7 x weekly - 2 sets - 10 reps - 3 sec hold - Seated Ankle Dorsiflexion with Resistance  - 1 x daily - 7 x weekly - 2 sets - 10 reps - 3 sec hold - Standing Terminal Knee Extension with Resistance  - 1 x daily - 7 x weekly - 2 sets - 10 reps - 5 sec hold  *[email or web address] access code texted to patient   ASSESSMENT:  CLINICAL IMPRESSION: Coel reports HEP going well for most exercises, however reports increased foot pain with heel/toe raises limiting the number of repetitions, as well as increased knee and low back pain during mini squat.  HEP reviewed with clarifications and  modifications provided, substituting black TB ankle DF/PF in place of weightbearing heel/toe raises and black TB TKE for mini squat (increased pain still reported despite correction of posture and alignment as well as cues to limit depth of squat).  Patient made aware to continue to bring any concerns to PT's attention, and if HEP exercises are doing well at home, therapy sessions will continue to progress strengthening as well as balance activities.  OBJECTIVE IMPAIRMENTS: Abnormal gait, decreased activity tolerance, decreased balance, decreased coordination, decreased endurance, decreased knowledge of condition, decreased mobility, difficulty walking, decreased ROM, decreased strength, increased edema, increased fascial restrictions, impaired perceived functional ability, increased muscle spasms, impaired flexibility, impaired sensation, improper body mechanics, postural dysfunction, and pain.   ACTIVITY LIMITATIONS: carrying, lifting, bending, standing, squatting, stairs, transfers, bed mobility, locomotion level, and caring for others  PARTICIPATION LIMITATIONS: meal prep, cleaning, laundry, shopping, community activity, and occupation  PERSONAL FACTORS: Past/current experiences, Time since onset of injury/illness/exacerbation, and 3+ comorbidities: AAA, chest pain on exertion, TIA, HTN, CKD, lumbar DDD/HNP with radiculopathy, obesity  are also affecting patient's functional outcome.   REHAB POTENTIAL: Good  CLINICAL DECISION MAKING: Evolving/moderate complexity  EVALUATION COMPLEXITY: Moderate   GOALS: Goals reviewed with patient? Yes  SHORT TERM GOALS: Target date: 01/22/2023   Patient will be independent with initial HEP to improve outcomes and carryover.  Baseline:  Goal status: IN PROGRESS  2.  Patient will be educated on strategies to decrease risk of falls.  Baseline:  Goal status: IN PROGRESS  3.  Patient will demonstrate decreased 5xSTS time to </= 15 sec to decrease risk  for falls with transitional mobility and for improved efficiency and safety with transfers. Baseline: 20.41 sec with B UE assist required Goal status: IN PROGRESS  LONG TERM GOALS: Target date: 02/19/2023   Patient will be independent with advanced/ongoing HEP to facilitate ability to maintain/progress functional gains from skilled physical therapy services. Baseline:  Goal status: IN PROGRESS  2.  Patient will be able to ambulate 600' with or w/o LRAD on variable surfaces with good safety to access community.  Baseline:  Goal status: IN PROGRESS  3.  Patient will be able to step up/down curb safely with LRAD for safety with community ambulation.  Baseline:  Goal status: IN PROGRESS   4.  Patient will demonstrate improved B LE strength to >/= 4 to 4+/5 for improved stability and ease of mobility. Baseline: Refer to above MMT table Goal status: IN PROGRESS  5.  Patient will improve 5x STS time to </= 13.6 seconds w/o need for UE assist for improved efficiency and safety with transfers Baseline: 20.41 sec with B UE assist required Goal status: IN PROGRESS   6.  Patient will  demonstrate gait speed of >/= 3.54 ft/sec to be a safe community ambulator with decreased risk for recurrent falls.  Baseline: 2.72 ft/sec Goal status: IN PROGRESS  7.  Patient will improve Berg score by >/= 8 points to improve safety and stability with ADLs in standing and reduce risk for falls.   Baseline: 39/56 (12/27/22) Goal status: IN PROGRESS  8. Patient will improve FGA score to at least 19/30 to improve gait stability and reduce risk for falls. Baseline: 14/30 (12/27/22) Goal status: IN PROGRESS  9.  Patient will report >/= 68% on ABC scale to demonstrate improved functional ability. Baseline: 790 / 1600 = 49.4 % Goal status: IN PROGRESS   PLAN:  PT FREQUENCY: 2x/week  PT DURATION: 8 weeks  PLANNED INTERVENTIONS: Therapeutic exercises, Therapeutic activity, Neuromuscular re-education, Balance  training, Gait training, Patient/Family education, Self Care, Joint mobilization, Stair training, DME instructions, Aquatic Therapy, Dry Needling, Electrical stimulation, Spinal mobilization, Cryotherapy, Moist heat, Taping, Vasopneumatic device, Traction, Ultrasound, Ionotophoresis 4mg /ml Dexamethasone, Manual therapy, and Re-evaluation  PLAN FOR NEXT SESSION:  progress ROM/flexibility along with lumbopelvic and LE strengthening, esp quad control in standing avoid genu recurvatum; initiate balance activities as tolerated; review and update HEP as needed   Marry Guan, PT 01/01/2023, 3:36 PM

## 2023-01-03 ENCOUNTER — Ambulatory Visit: Payer: Medicare Other | Admitting: Physical Therapy

## 2023-01-03 ENCOUNTER — Encounter: Payer: Self-pay | Admitting: Physical Therapy

## 2023-01-03 DIAGNOSIS — R269 Unspecified abnormalities of gait and mobility: Secondary | ICD-10-CM | POA: Diagnosis not present

## 2023-01-03 DIAGNOSIS — R2689 Other abnormalities of gait and mobility: Secondary | ICD-10-CM | POA: Diagnosis not present

## 2023-01-03 DIAGNOSIS — R2681 Unsteadiness on feet: Secondary | ICD-10-CM

## 2023-01-03 DIAGNOSIS — M6281 Muscle weakness (generalized): Secondary | ICD-10-CM

## 2023-01-03 NOTE — Therapy (Signed)
OUTPATIENT PHYSICAL THERAPY TREATMENT   Patient Name: Joseph Compton MRN: [id number] DOB:July 10, 1956, 67 y.o., male Today's Date: 01/03/2023   END OF SESSION:  PT End of Session - 01/03/23 1445     Visit Number 4    Date for PT Re-Evaluation 02/19/23    Authorization Type Medicare & [employer]    Progress Note Due on Visit 10    PT Start Time 1445    PT Stop Time 1529    PT Time Calculation (min) 44 min    Activity Tolerance Patient tolerated treatment well    Behavior During Therapy WFL for tasks assessed/performed               Past Medical History:  Diagnosis Date   Abdominal aortic aneurysm (AAA) 3.0 cm to 5.5 cm in diameter in male (HCC) 03/07/2022   PER PATIENT   Arthritis    Chest pain on exertion    Chronic kidney disease, stage 3a (HCC)    GERD (gastroesophageal reflux disease)    History of Helicobacter pylori infection    in [place]   History of transient ischemic attack (TIA)    per patient   Hyperlipemia    Hypertension    Hypertensive kidney disease with CKD (chronic kidney disease) stage I    Impaired fasting glucose    Obesity    Radiculopathy, lumbar region    Past Surgical History:  Procedure Laterality Date   MOUTH SURGERY  2000   Patient Active Problem List   Diagnosis Date Noted   Dyslipidemia, goal LDL below 70 07/27/2022   Lumbar radiculopathy 04/05/2021   Pain in left foot 09/17/2019   Degeneration of lumbar intervertebral disc 11/17/2018   Epidural lipomatosis 11/17/2018   Lumbar disc herniation with radiculopathy 11/06/2018    PCP: Geoffry Paradise, MD   REFERRING PROVIDER: Edwin Cap, DPM   REFERRING DIAG: R26.9 (ICD-10-CM) - Neurologic gait dysfunction  Evaluate and treat for 1-2 sessions / week for 4-6 weeks or at therapist's discretion. Patient has gait dysfunction and balance problems, would like to include ROM, strengthening, stability, and gait training, mobility and balance. Modalities PRN at therapist's  discretion.   THERAPY DIAG:  Other abnormalities of gait and mobility  Unsteadiness on feet  Muscle weakness (generalized)  RATIONALE FOR EVALUATION AND TREATMENT: Rehabilitation  ONSET DATE: 4-6 yrs  NEXT MD VISIT: 01/17/23   SUBJECTIVE:  SUBJECTIVE STATEMENT: Pt reports he has been on his feet a lot today at work, but suprisingly his knees/legs are not hurting much.   PAIN: Are you having pain? Yes: NPRS scale: </=3/10 Pain location: R knees Pain description: ache & tight feeling with intermittent sciatic feeling Aggravating factors: prolonged standing or walking Relieving factors: sitting down for limited periods  PERTINENT HISTORY:  AAA, chest pain on exertion, TIA, HTN, CKD, lumbar DDD/HNP with radiculopathy, obesity  PRECAUTIONS: Fall  WEIGHT BEARING RESTRICTIONS: No  FALLS:  Has patient fallen in last 6 months? 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, light lifting)  PLOF: Independent and Leisure: Rec bike x 30 min every morning, light free weights circuit training, TM occasionally  PATIENT GOALS: "better balance, esp on declines and stairs"   OBJECTIVE: (objective measures completed at initial evaluation unless otherwise dated)  DIAGNOSTIC FINDINGS:  No imaging available but OA in knees & HNP in back per pt  COGNITION: Overall cognitive status: Within functional limits for tasks assessed   SENSATION: WFL, occasional R>L sciatica  COORDINATION: Slow B heel-toe rock with limited heel lift Impaired  heel to shin bil  EDEMA:  Mild to moderate B distal LE edema  MUSCLE LENGTH: Hamstrings: mod tight R>L ITB: NT Piriformis: mod tight R>L Hip flexors: mod tight B Quads: mod tight R>L Heelcord: mod tight R>L  POSTURE:  Wide BOS with B genu recurvatum, R lateral shift  LOWER EXTREMITY ROM:    LE ROM limited by tightness as above and B knee OA Active  Right Eval Left Eval  Hip flexion    Hip extension    Hip abduction    Hip adduction    Hip internal rotation    Hip external rotation    Knee flexion 119 124  Knee extension 9 2  Ankle dorsiflexion    Ankle plantarflexion    Ankle inversion    Ankle eversion     (Blank rows = not tested)  LOWER EXTREMITY MMT:    MMT Right Eval Left Eval  Hip flexion 4- 4-  Hip extension 3- 2+  Hip abduction 2 3-  Hip adduction 2 2  Hip internal rotation 3+ 4-  Hip external rotation 3+ 3+  Knee flexion 4- 4  Knee extension 4- 4-  Ankle dorsiflexion 4- 4-  Ankle plantarflexion 3+ NWB 4- NWB  Ankle inversion    Ankle eversion    (Blank rows = not tested)  BED MOBILITY:  Difficulty with rolling on mat table but assist from PT not needed  TRANSFERS: Assistive device utilized: None  Sit to stand: Modified independence - requires B UE assist Stand to sit: Modified independence Chair to chair:  NT Floor:  NT  GAIT: Distance walked: 60 ft Assistive device utilized: None Level of assistance: SBA Gait pattern: decreased stride length, decreased hip/knee flexion- Right, decreased hip/knee flexion- Left, genu recurvatum- Right, genu recurvatum- Left, decreased trunk rotation, and wide BOS Comments: 2.72 ft/sec gait speed  RAMP: Level of Assistance:  NT Assistive device utilized:  NT Ramp Comments:  pt reports difficulty with ambulating on declines  CURB:  Level of Assistance:  NT Assistive device utilized:  NT Curb Comments:   STAIRS: (12/27/22)  Level of Assistance: SBA  Stair Negotiation Technique:  Step to Pattern  Forwards on ascent, Backwards on descent  with Bilateral Rails Number of Stairs: 4  Height of Stairs: 7"  Comments: pt very hesitant with steps due to R>L knee  FUNCTIONAL TESTS:  5 times sit to stand: 20.41 sec - B UE assist necessary Timed up and go (TUG): 11.28 sec 10 meter walk test: 12.06 sec; Gait speed = 2.72 ft/sec Berg Balance Scale: 12/27/22: 39/56; 37-45 = Significant (>80%) fall risk  Functional gait assessment: 12/27/22: 14/30; < 19 = high risk fall  PATIENT SURVEYS:  ABC scale 790 / 1600 = 49.4 %   TODAY'S TREATMENT:   01/03/23 THERAPEUTIC EXERCISE: to improve flexibility, strength and mobility.  Verbal and tactile cues throughout for technique. NuStep - L6 x 6 min (UE/LE) Standing church pews x 10 - cues to avoid knee hyperextension and to minimize fwd/back motion of torso Retro step with contralateral arm raise & single UE support on counter 2 x 10 Alt lateral step with slight lunge/knee flexion x 10, UE support on counter Standing black TB TKE x 10 bil, UE support on back of chair - cues to stop pull back with knee straight, avoiding hyperextension Standing black TB reverse TKE x 10 bil, UE support on back of chair - cues to avoid allowing band to pull knee into recurvatum Standing TKE into small ball on wall x 10 bil, UE support on back of chair Standing R/L hip ABD isometric into ball on wall x 10 each Seated black TB PF & DF 2 x 10    01/01/23 THERAPEUTIC EXERCISE: to improve flexibility, strength and mobility.  Verbal and tactile cues throughout for technique. Rec Bike - L5 x 6 min Seated hip hinge HS stretch 2 x 30" bil - 2nd rep with foot elevated on 6" step Side-sitting figure-4 piriformis stretch x 30" bil Seated lunge position hip flexor stretch x 30" bil  Seated black TB PF & DF 2 x 10 (in place of standing heel/toe raises as pt reporting increased foot pain in WBing) Standing church pews x 20 - cues to avoid knee hyperextension Standing counter  minisquat x 5 - cues to maintain upright torso and limit squat depth to pain free range, but pt still reporting increased back and knee pain therefore deferred from HEP Standing R/L hip ABD x 10, UE support on counter Standing R/L hip ABD x 10, UE support on counter Standing black TB TKE 2 x 10, UE support on back of chair   12/27/22 THERAPEUTIC ACTIVITIES: Sharlene Motts: 39/56; 37-45 = Significant (>80%) fall risk  FGA: 14/30; < 19 = high risk fall   THERAPEUTIC EXERCISE: to improve flexibility, strength and mobility.  Verbal and tactile cues throughout for technique.  Seated hip hinge HS stretch x 30" bil Side-sitting figure-4 piriformis stretch x 30" Seated lunge position hip flexor stretch x 30" Standing church pews x 20 - cues to avoid knee hyperextension Heel/toes raises x 10 Standing counter minisquat x 10 - cues to maintain upright torso and limit squat depth to pain free range Standing R/L hip ABD x 10, UE support on counter Standing R/L hip ABD x 10, UE support on counter   12/24/22 Eval only   PATIENT EDUCATION:  Education details: HEP modification - black TB ankle DF/PF in place of heel/toe raises, black TB TKE in place of mini-squat   Person educated: Patient Education method: Explanation, Demonstration, Verbal cues, Handouts, and MedbridgeGO access code texted to pt Education comprehension: verbalized understanding, returned demonstration,  verbal cues required, and needs further education  HOME EXERCISE PROGRAM: *Access Code: [email or web address] URL: [email or web address] Date: 01/03/2023 Prepared by: Glenetta Hew  Exercises - Seated Hamstring Stretch  - 2 x daily - 7 x weekly - 3 reps - 30 sec hold - Seated Table Piriformis Stretch  - 2 x daily - 7 x weekly - 3 reps - 30 sec hold - Seated Hip Flexor Stretch  - 2 x daily - 7 x weekly - 3 reps - 30 sec hold - Church Pew  - 1 x daily - 7 x weekly - 2 sets - 10 reps - 3 sec hold - Standing Hip Abduction with Counter  Support  - 1 x daily - 7 x weekly - 2 sets - 10 reps - 2-3 sec hold - Standing Hip Extension with Counter Support  - 1 x daily - 7 x weekly - 2 sets - 10 reps - 2-3 sec hold - Seated Ankle Plantar Flexion with Resistance Loop  - 1 x daily - 7 x weekly - 2 sets - 10 reps - 3 sec hold - Seated Ankle Dorsiflexion with Resistance  - 1 x daily - 7 x weekly - 2 sets - 10 reps - 3 sec hold - Standing Terminal Knee Extension with Resistance  - 1 x daily - 7 x weekly - 2 sets - 10 reps - 5 sec hold - Retro Step  - 1 x daily - 7 x weekly - 2 sets - 10 reps - 3 sec hold - Sidestepping  - 1 x daily - 7 x weekly - 2 sets - 10 reps - 3 sec hold  *MedBridgeGO access code texted to patient   ASSESSMENT:  CLINICAL IMPRESSION: Corinthian reports he has been focusing more on the HEP exercises w/o the band, therefore  reviewed ankle DF/PF and TKE with TB for proper technique. Expanded on quad activation exercises w/o TB resistance with pt reporting better awareness of quad activation and knee position with retro step and alternating side-step with slight knee flexion, therefore HEP instructions provided at pt request. Reeves will continue benefit from skilled PT to address ongoing pain, strength and balance deficits to improve mobility and activity tolerance with decreased pain interference and decreased risk for falls.   OBJECTIVE IMPAIRMENTS: Abnormal gait, decreased activity tolerance, decreased balance, decreased coordination, decreased endurance, decreased knowledge of condition, decreased mobility, difficulty walking, decreased ROM, decreased strength, increased edema, increased fascial restrictions, impaired perceived functional ability, increased muscle spasms, impaired flexibility, impaired sensation, improper body mechanics, postural dysfunction, and pain.   ACTIVITY LIMITATIONS: carrying, lifting, bending, standing, squatting, stairs, transfers, bed mobility, locomotion level, and caring for  others  PARTICIPATION LIMITATIONS: meal prep, cleaning, laundry, shopping, community activity, and occupation  PERSONAL FACTORS: Past/current experiences, Time since onset of injury/illness/exacerbation, and 3+ comorbidities: AAA, chest pain on exertion, TIA, HTN, CKD, lumbar DDD/HNP with radiculopathy, obesity  are also affecting patient's functional outcome.   REHAB POTENTIAL: Good  CLINICAL DECISION MAKING: Evolving/moderate complexity  EVALUATION COMPLEXITY: Moderate   GOALS: Goals reviewed with patient? Yes  SHORT TERM GOALS: Target date: 01/22/2023   Patient will be independent with initial HEP to improve outcomes and carryover.  Baseline:  Goal status: IN PROGRESS  2.  Patient will be educated on strategies to decrease risk of falls.  Baseline:  Goal status: IN PROGRESS  3.  Patient will demonstrate decreased 5xSTS time to </= 15 sec to decrease risk for falls with transitional mobility and for improved efficiency and  safety with transfers. Baseline: 20.41 sec with B UE assist required Goal status: IN PROGRESS  LONG TERM GOALS: Target date: 02/19/2023   Patient will be independent with advanced/ongoing HEP to facilitate ability to maintain/progress functional gains from skilled physical therapy services. Baseline:  Goal status: IN PROGRESS  2.  Patient will be able to ambulate 600' with or w/o LRAD on variable surfaces with good safety to access community.  Baseline:  Goal status: IN PROGRESS  3.  Patient will be able to step up/down curb safely with LRAD for safety with community ambulation.  Baseline:  Goal status: IN PROGRESS   4.  Patient will demonstrate improved B LE strength to >/= 4 to 4+/5 for improved stability and ease of mobility. Baseline: Refer to above MMT table Goal status: IN PROGRESS  5.  Patient will improve 5x STS time to </= 13.6 seconds w/o need for UE assist for improved efficiency and safety with transfers Baseline: 20.41 sec with B UE  assist required Goal status: IN PROGRESS   6.  Patient will demonstrate gait speed of >/= 3.54 ft/sec to be a safe community ambulator with decreased risk for recurrent falls.  Baseline: 2.72 ft/sec Goal status: IN PROGRESS  7.  Patient will improve Berg score by >/= 8 points to improve safety and stability with ADLs in standing and reduce risk for falls.   Baseline: 39/56 (12/27/22) Goal status: IN PROGRESS  8. Patient will improve FGA score to at least 19/30 to improve gait stability and reduce risk for falls. Baseline: 14/30 (12/27/22) Goal status: IN PROGRESS  9.  Patient will report >/= 68% on ABC scale to demonstrate improved functional ability. Baseline: 790 / 1600 = 49.4 % Goal status: IN PROGRESS   PLAN:  PT FREQUENCY: 2x/week  PT DURATION: 8 weeks  PLANNED INTERVENTIONS: Therapeutic exercises, Therapeutic activity, Neuromuscular re-education, Balance training, Gait training, Patient/Family education, Self Care, Joint mobilization, Stair training, DME instructions, Aquatic Therapy, Dry Needling, Electrical stimulation, Spinal mobilization, Cryotherapy, Moist heat, Taping, Vasopneumatic device, Traction, Ultrasound, Ionotophoresis 4mg /ml Dexamethasone, Manual therapy, and Re-evaluation  PLAN FOR NEXT SESSION:  progress ROM/flexibility along with lumbopelvic and LE strengthening, esp quad control in standing avoid genu recurvatum; initiate balance activities as tolerated; review and update HEP as needed   Marry Guan, PT 01/03/2023, 3:39 PM

## 2023-01-08 ENCOUNTER — Ambulatory Visit: Payer: Medicare Other

## 2023-01-10 ENCOUNTER — Encounter: Payer: Self-pay | Admitting: Physical Therapy

## 2023-01-10 ENCOUNTER — Ambulatory Visit: Payer: Medicare Other | Admitting: Physical Therapy

## 2023-01-10 DIAGNOSIS — M6281 Muscle weakness (generalized): Secondary | ICD-10-CM

## 2023-01-10 DIAGNOSIS — R2689 Other abnormalities of gait and mobility: Secondary | ICD-10-CM

## 2023-01-10 DIAGNOSIS — R269 Unspecified abnormalities of gait and mobility: Secondary | ICD-10-CM | POA: Diagnosis not present

## 2023-01-10 DIAGNOSIS — R2681 Unsteadiness on feet: Secondary | ICD-10-CM | POA: Diagnosis not present

## 2023-01-10 NOTE — Therapy (Signed)
OUTPATIENT PHYSICAL THERAPY TREATMENT   Patient Name: Joseph Compton MRN: [id number] DOB:[birth year], 67 y.o., male Today's Date: 01/10/2023   END OF SESSION:  PT End of Session - 01/10/23 1452     Visit Number 5    Date for PT Re-Evaluation 02/19/23    Authorization Type Medicare & [employer]    Progress Note Due on Visit 10    PT Start Time 1452   Pt arrived late   PT Stop Time 1542    PT Time Calculation (min) 50 min    Activity Tolerance Patient tolerated treatment well;Patient limited by pain    Behavior During Therapy [facility] for tasks assessed/performed               Past Medical History:  Diagnosis Date   Abdominal aortic aneurysm (AAA) 3.0 cm to 5.5 cm in diameter in male (HCC) 03/07/2022   PER PATIENT   Arthritis    Chest pain on exertion    Chronic kidney disease, stage 3a (HCC)    GERD (gastroesophageal reflux disease)    History of Helicobacter pylori infection    in [place]   History of transient ischemic attack (TIA)    per patient   Hyperlipemia    Hypertension    Hypertensive kidney disease with CKD (chronic kidney disease) stage I    Impaired fasting glucose    Obesity    Radiculopathy, lumbar region    Past Surgical History:  Procedure Laterality Date   MOUTH SURGERY  2000   Patient Active Problem List   Diagnosis Date Noted   Dyslipidemia, goal LDL below 70 07/27/2022   Lumbar radiculopathy 04/05/2021   Pain in left foot 09/17/2019   Degeneration of lumbar intervertebral disc 11/17/2018   Epidural lipomatosis 11/17/2018   Lumbar disc herniation with radiculopathy 11/06/2018    PCP: Geoffry Paradise, MD   REFERRING PROVIDER: Edwin Cap, DPM   REFERRING DIAG: R26.9 (ICD-10-CM) - Neurologic gait dysfunction  Evaluate and treat for 1-2 sessions / week for 4-6 weeks or at therapist's discretion. Patient has gait dysfunction and balance problems, would like to include ROM, strengthening, stability, and gait training, mobility and  balance. Modalities PRN at therapist's discretion.   THERAPY DIAG:  Other abnormalities of gait and mobility  Unsteadiness on feet  Muscle weakness (generalized)  RATIONALE FOR EVALUATION AND TREATMENT: Rehabilitation  ONSET DATE: 4-6 yrs  NEXT MD VISIT: 01/17/23   SUBJECTIVE:  SUBJECTIVE STATEMENT: Pt reports he has not worked on the HEP much since last visit as his R knee was hurting more yesterday up to ~5/10 - not sure what may have triggered the pain  PAIN: Are you having pain? Yes: NPRS scale: </=3/10 Pain location: R knee Pain description: ache & tight feeling with intermittent sciatic feeling Aggravating factors: prolonged standing or walking Relieving factors: sitting down for limited periods  PERTINENT HISTORY:  AAA, chest pain on exertion, TIA, HTN, CKD, lumbar DDD/HNP with radiculopathy, obesity  PRECAUTIONS: Fall  WEIGHT BEARING RESTRICTIONS: No  FALLS:  Has patient fallen in last 6 months? 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, light lifting)  PLOF: Independent and Leisure: Rec bike x 30 min every morning, light free weights circuit training, TM occasionally  PATIENT GOALS: "better balance, esp on declines and stairs"   OBJECTIVE: (objective measures completed at initial evaluation unless otherwise dated)  DIAGNOSTIC FINDINGS:  No imaging available but OA in knees & HNP in back per pt  COGNITION: Overall cognitive status: Within functional limits for tasks assessed   SENSATION: WFL,  occasional R>L sciatica  COORDINATION: Slow B heel-toe rock with limited heel lift Impaired heel to shin bil  EDEMA:  Mild to moderate B distal LE edema  MUSCLE LENGTH: Hamstrings: mod tight R>L ITB: NT Piriformis: mod tight R>L Hip flexors: mod tight B Quads: mod tight R>L Heelcord: mod tight R>L  POSTURE:  Wide BOS with B genu recurvatum, R lateral shift  LOWER EXTREMITY ROM:    LE ROM limited by tightness as above and B knee OA Active  Right Eval Left Eval  Hip flexion    Hip extension    Hip abduction    Hip adduction    Hip internal rotation    Hip external rotation    Knee flexion 119 124  Knee extension 9 2  Ankle dorsiflexion    Ankle plantarflexion    Ankle inversion    Ankle eversion     (Blank rows = not tested)  LOWER EXTREMITY MMT:    MMT Right Eval Left Eval  Hip flexion 4- 4-  Hip extension 3- 2+  Hip abduction 2 3-  Hip adduction 2 2  Hip internal rotation 3+ 4-  Hip external rotation 3+ 3+  Knee flexion 4- 4  Knee extension 4- 4-  Ankle dorsiflexion 4- 4-  Ankle plantarflexion 3+ NWB 4- NWB  Ankle inversion    Ankle eversion    (Blank rows = not tested)  BED MOBILITY:  Difficulty with rolling on mat table but assist from PT not needed  TRANSFERS: Assistive device utilized: None  Sit to stand: Modified independence - requires B UE assist Stand to sit: Modified independence Chair to chair:  NT Floor:  NT  GAIT: Distance walked: 60 ft Assistive device utilized: None Level of assistance: SBA Gait pattern: decreased stride length, decreased hip/knee flexion- Right, decreased hip/knee flexion- Left, genu recurvatum- Right, genu recurvatum- Left, decreased trunk rotation, and wide BOS Comments: 2.72 ft/sec gait speed  RAMP: Level of Assistance:  NT Assistive device utilized:  NT Ramp Comments:  pt reports difficulty with ambulating on declines  CURB:  Level of Assistance:  NT Assistive device utilized:  NT Curb Comments:  STAIRS: (12/27/22)  Level of Assistance: SBA  Stair Negotiation Technique: Step to Pattern Forwards on ascent, Backwards on descent  with Bilateral Rails Number of Stairs: 4  Height of Stairs: 7"  Comments: pt very hesitant with steps due to R>L knee  FUNCTIONAL TESTS:  5 times sit to stand: 20.41 sec - B UE assist necessary Timed up and go (TUG): 11.28 sec 10 meter walk test: 12.06 sec; Gait speed = 2.72 ft/sec Berg Balance Scale: 12/27/22: 39/56; 37-45 = Significant (>80%) fall risk  Functional gait assessment: 12/27/22: 14/30; < 19 = high risk fall  PATIENT SURVEYS:  ABC scale 790 / 1600 = 49.4 %   TODAY'S TREATMENT:   01/10/23 THERAPEUTIC EXERCISE: to improve flexibility, strength and mobility.  Verbal and tactile cues throughout for technique. NuStep - L6 x 6 min (UE/LE) Seated hip hinge HS stretch 2 x 30" - cues to avoid pressing knee into hyperextension Seated R/L Fitter leg press (1 black/1 blue) x 20 Seated R/L GTB HS curl/knee flexion 2 x 10  Standing R/L TKE into small green ball on wall 10 x 5" Attempted mini-wall squat + hip ADD isometric into small green ball but deferred due to increased R knee pain Seated R/L HS isometric into blue/green striped ball 10 x 5" Seated B hip ADD isometric into small green ball 10 x 5" Supine B hip ADD isometric into small green ball 10 x 5" Bridge + B hip ADD isometric into small green ball 10 x 5" Hooklying R/LB hip ADD isometric ball squeeze + SAQ x 10 on L, x 3 on R - discontinued due to increased pain and lateral patellar tracking   01/03/23 THERAPEUTIC EXERCISE: to improve flexibility, strength and mobility.  Verbal and tactile cues throughout for technique. NuStep - L6 x 6 min (UE/LE) Standing church pews x 10 - cues to avoid knee hyperextension and to minimize fwd/back motion of torso Retro step with contralateral arm raise & single UE support on counter 2 x 10 Alt lateral step with slight lunge/knee flexion x 10, UE support  on counter Standing black TB TKE x 10 bil, UE support on back of chair - cues to stop pull back with knee straight, avoiding hyperextension Standing black TB reverse TKE x 10 bil, UE support on back of chair - cues to avoid allowing band to pull knee into recurvatum Standing TKE into small ball on wall x 10 bil, UE support on back of chair Standing R/L hip ABD isometric into ball on wall x 10 each Seated black TB PF & DF 2 x 10    01/01/23 THERAPEUTIC EXERCISE: to improve flexibility, strength and mobility.  Verbal and tactile cues throughout for technique. Rec Bike - L5 x 6 min Seated hip hinge HS stretch 2 x 30" bil - 2nd rep with foot elevated on 6" step Side-sitting figure-4 piriformis stretch x 30" bil Seated lunge position hip flexor stretch x 30" bil  Seated black TB PF & DF 2 x 10 (in place of standing heel/toe raises as pt reporting increased foot pain in WBing) Standing church pews x 20 - cues to avoid knee hyperextension Standing counter minisquat x 5 - cues to maintain upright torso and limit squat depth to pain free range, but pt still reporting increased back and knee pain therefore deferred from HEP Standing R/L hip ABD x 10, UE support on counter Standing R/L hip extension x 10, UE support on counter Standing black TB TKE 2 x 10, UE  support on back of chair   PATIENT EDUCATION:  Education details: HEP update - hip ADD ball squeeze isometric   Person educated: Patient Education method: Explanation, Demonstration, Verbal cues, Handouts, and MedbridgeGO code updated Education comprehension: verbalized understanding, returned demonstration, verbal cues required, and needs further education  HOME EXERCISE PROGRAM: *Access Code: [email or web address] URL: [email or web address] Date: 01/10/2023 Prepared by: Glenetta Hew  Exercises - Seated Hamstring Stretch  - 2 x daily - 7 x weekly - 3 reps - 30 sec hold - Seated Table Piriformis Stretch  - 2 x daily - 7 x weekly - 3  reps - 30 sec hold - Seated Hip Flexor Stretch  - 2 x daily - 7 x weekly - 3 reps - 30 sec hold - Church Pew  - 1 x daily - 7 x weekly - 2 sets - 10 reps - 3 sec hold - Standing Hip Abduction with Counter Support  - 1 x daily - 7 x weekly - 2 sets - 10 reps - 2-3 sec hold - Standing Hip Extension with Counter Support  - 1 x daily - 7 x weekly - 2 sets - 10 reps - 2-3 sec hold - Seated Ankle Plantar Flexion with Resistance Loop  - 1 x daily - 7 x weekly - 2 sets - 10 reps - 3 sec hold - Seated Ankle Dorsiflexion with Resistance  - 1 x daily - 7 x weekly - 2 sets - 10 reps - 3 sec hold - Standing Terminal Knee Extension with Resistance  - 1 x daily - 7 x weekly - 2 sets - 10 reps - 5 sec hold - Retro Step  - 1 x daily - 7 x weekly - 2 sets - 10 reps - 3 sec hold - Sidestepping  - 1 x daily - 7 x weekly - 2 sets - 10 reps - 3 sec hold - Seated Hip Adduction Squeeze with Ball  - 1 x daily - 7 x weekly - 2 sets - 10 reps - 3-5 sec hold - Supine Hip Adduction Isometric with Ball  - 1 x daily - 7 x weekly - 2 sets - 10 reps - 5 sec hold  *MedBridgeGO access code texted to patient   ASSESSMENT:  CLINICAL IMPRESSION: Choyce reports limited performance of HEP this week due to increased R knee pain but uncertain if the exercises were what was causing the increase in pain.  He continues to rely on R genu recurvatum for knee stability with limited tolerance for weightbearing flexion especially near end range of extension.  He demonstrates increased lateral tracking of patella, worse on right than left, which is likely contributing to his limited tolerance of weightbearing knee flexion as well as difficulty with SAQ.  HEP updated to include hip abduction isometric to help facilitate VMO activation for improved patellar tracking and better control of quad near terminal knee extension.  OBJECTIVE IMPAIRMENTS: Abnormal gait, decreased activity tolerance, decreased balance, decreased coordination, decreased  endurance, decreased knowledge of condition, decreased mobility, difficulty walking, decreased ROM, decreased strength, increased edema, increased fascial restrictions, impaired perceived functional ability, increased muscle spasms, impaired flexibility, impaired sensation, improper body mechanics, postural dysfunction, and pain.   ACTIVITY LIMITATIONS: carrying, lifting, bending, standing, squatting, stairs, transfers, bed mobility, locomotion level, and caring for others  PARTICIPATION LIMITATIONS: meal prep, cleaning, laundry, shopping, community activity, and occupation  PERSONAL FACTORS: Past/current experiences, Time since onset of injury/illness/exacerbation, and 3+ comorbidities: AAA, chest pain on exertion, TIA, HTN, CKD, lumbar  DDD/HNP with radiculopathy, obesity  are also affecting patient's functional outcome.   REHAB POTENTIAL: Good  CLINICAL DECISION MAKING: Evolving/moderate complexity  EVALUATION COMPLEXITY: Moderate   GOALS: Goals reviewed with patient? Yes  SHORT TERM GOALS: Target date: 01/22/2023   Patient will be independent with initial HEP to improve outcomes and carryover.  Baseline:  Goal status: IN PROGRESS  2.  Patient will be educated on strategies to decrease risk of falls.  Baseline:  Goal status: IN PROGRESS  3.  Patient will demonstrate decreased 5xSTS time to </= 15 sec to decrease risk for falls with transitional mobility and for improved efficiency and safety with transfers. Baseline: 20.41 sec with B UE assist required Goal status: IN PROGRESS  LONG TERM GOALS: Target date: 02/19/2023   Patient will be independent with advanced/ongoing HEP to facilitate ability to maintain/progress functional gains from skilled physical therapy services. Baseline:  Goal status: IN PROGRESS  2.  Patient will be able to ambulate 600' with or w/o LRAD on variable surfaces with good safety to access community.  Baseline:  Goal status: IN PROGRESS  3.  Patient  will be able to step up/down curb safely with LRAD for safety with community ambulation.  Baseline:  Goal status: IN PROGRESS   4.  Patient will demonstrate improved B LE strength to >/= 4 to 4+/5 for improved stability and ease of mobility. Baseline: Refer to above MMT table Goal status: IN PROGRESS  5.  Patient will improve 5x STS time to </= 13.6 seconds w/o need for UE assist for improved efficiency and safety with transfers Baseline: 20.41 sec with B UE assist required Goal status: IN PROGRESS   6.  Patient will demonstrate gait speed of >/= 3.54 ft/sec to be a safe community ambulator with decreased risk for recurrent falls.  Baseline: 2.72 ft/sec Goal status: IN PROGRESS  7.  Patient will improve Berg score by >/= 8 points to improve safety and stability with ADLs in standing and reduce risk for falls.   Baseline: 39/56 (12/27/22) Goal status: IN PROGRESS  8. Patient will improve FGA score to at least 19/30 to improve gait stability and reduce risk for falls. Baseline: 14/30 (12/27/22) Goal status: IN PROGRESS  9.  Patient will report >/= 68% on ABC scale to demonstrate improved functional ability. Baseline: 790 / 1600 = 49.4 % Goal status: IN PROGRESS   PLAN:  PT FREQUENCY: 2x/week  PT DURATION: 8 weeks  PLANNED INTERVENTIONS: Therapeutic exercises, Therapeutic activity, Neuromuscular re-education, Balance training, Gait training, Patient/Family education, Self Care, Joint mobilization, Stair training, DME instructions, Aquatic Therapy, Dry Needling, Electrical stimulation, Spinal mobilization, Cryotherapy, Moist heat, Taping, Vasopneumatic device, Traction, Ultrasound, Ionotophoresis 4mg /ml Dexamethasone, Manual therapy, and Re-evaluation  PLAN FOR NEXT SESSION:  progress ROM/flexibility along with lumbopelvic and LE strengthening, esp quad control in standing avoid genu recurvatum; initiate balance activities as tolerated; review and update HEP as needed   Marry Guan, PT 01/10/2023, 3:51 PM

## 2023-01-15 ENCOUNTER — Ambulatory Visit: Payer: Medicare Other | Admitting: Physical Therapy

## 2023-01-17 ENCOUNTER — Ambulatory Visit: Payer: Medicare Other | Admitting: Podiatry

## 2023-01-17 ENCOUNTER — Ambulatory Visit: Payer: Medicare Other | Attending: Podiatry | Admitting: Physical Therapy

## 2023-01-17 DIAGNOSIS — R2681 Unsteadiness on feet: Secondary | ICD-10-CM | POA: Diagnosis not present

## 2023-01-17 DIAGNOSIS — R2689 Other abnormalities of gait and mobility: Secondary | ICD-10-CM | POA: Insufficient documentation

## 2023-01-17 DIAGNOSIS — M6281 Muscle weakness (generalized): Secondary | ICD-10-CM | POA: Insufficient documentation

## 2023-01-17 NOTE — Therapy (Addendum)
OUTPATIENT PHYSICAL THERAPY TREATMENT   Progress Note  Reporting Period 12/25/2022 to 01/17/2023  See note below for Objective Data and Assessment of Progress/Goals.    Patient Name: Fenway Carrico MRN: [id number] DOB:[birth year], 67 y.o., male Today's Date: 01/17/2023   END OF SESSION:  PT End of Session - 01/17/23 1704     Visit Number 6    Date for PT Re-Evaluation 02/19/23    Authorization Type Medicare & [employer]    Progress Note Due on Visit 10    PT Start Time 1704    PT Stop Time 1752    PT Time Calculation (min) 48 min    Activity Tolerance Patient tolerated treatment well;Patient limited by pain    Behavior During Therapy [facility] for tasks assessed/performed                Past Medical History:  Diagnosis Date   Abdominal aortic aneurysm (AAA) 3.0 cm to 5.5 cm in diameter in male (HCC) 03/07/2022   PER PATIENT   Arthritis    Chest pain on exertion    Chronic kidney disease, stage 3a (HCC)    GERD (gastroesophageal reflux disease)    History of Helicobacter pylori infection    in [place]   History of transient ischemic attack (TIA)    per patient   Hyperlipemia    Hypertension    Hypertensive kidney disease with CKD (chronic kidney disease) stage I    Impaired fasting glucose    Obesity    Radiculopathy, lumbar region    Past Surgical History:  Procedure Laterality Date   MOUTH SURGERY  2000   Patient Active Problem List   Diagnosis Date Noted   Dyslipidemia, goal LDL below 70 07/27/2022   Lumbar radiculopathy 04/05/2021   Pain in left foot 09/17/2019   Degeneration of lumbar intervertebral disc 11/17/2018   Epidural lipomatosis 11/17/2018   Lumbar disc herniation with radiculopathy 11/06/2018    PCP: Geoffry Paradise, MD   REFERRING PROVIDER: Edwin Cap, DPM   REFERRING DIAG: R26.9 (ICD-10-CM) - Neurologic gait dysfunction  Evaluate and treat for 1-2 sessions / week for 4-6 weeks or at therapist's discretion. Patient has gait  dysfunction and balance problems, would like to include ROM, strengthening, stability, and gait training, mobility and balance. Modalities PRN at therapist's discretion.   THERAPY DIAG:  Other abnormalities of gait and mobility  Unsteadiness on feet  Muscle weakness (generalized)  RATIONALE FOR EVALUATION AND TREATMENT: Rehabilitation  ONSET DATE: 4-6 yrs  NEXT MD VISIT: 01/22/23   SUBJECTIVE:  SUBJECTIVE STATEMENT: Pt reports he was not able to make his last PT visit as well as as his MD appt today due to increased pain.  PAIN: Are you having pain? Yes: NPRS scale: 4-53/10 Pain location: L>R knee Pain description: ache & tight feeling with intermittent sciatic feeling Aggravating factors: prolonged standing or walking Relieving factors: sitting down for limited periods  PERTINENT HISTORY:  AAA, chest pain on exertion, TIA, HTN, CKD, lumbar DDD/HNP with radiculopathy, obesity  PRECAUTIONS: Fall  WEIGHT BEARING RESTRICTIONS: No  FALLS:  Has patient fallen in last 6 months? 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, light lifting)  PLOF: Independent and Leisure: Rec bike x 30 min every morning, light free weights circuit training, TM occasionally  PATIENT GOALS: "better balance, esp on declines and stairs"   OBJECTIVE: (objective measures completed at initial evaluation unless otherwise dated)  DIAGNOSTIC FINDINGS:  No imaging available but OA in knees & HNP in back per pt  COGNITION: Overall cognitive status:  Within functional limits for tasks assessed   SENSATION: WFL, occasional R>L sciatica  COORDINATION: Slow B heel-toe rock with limited heel lift Impaired heel to shin bil  EDEMA:  Mild to moderate B distal LE edema  MUSCLE LENGTH: Hamstrings: mod tight R>L ITB: NT Piriformis: mod tight R>L Hip flexors: mod tight B Quads: mod tight R>L Heelcord: mod tight R>L  POSTURE:  Wide BOS with B genu recurvatum, R lateral shift  LOWER EXTREMITY ROM:    LE ROM limited by tightness as above and B knee OA Active  Right Eval Left Eval  Hip flexion    Hip extension    Hip abduction    Hip adduction    Hip internal rotation    Hip external rotation    Knee flexion 119 124  Knee extension 9 2  Ankle dorsiflexion    Ankle plantarflexion    Ankle inversion    Ankle eversion     (Blank rows = not tested)  LOWER EXTREMITY MMT:    MMT Right Eval Left Eval Right 01/17/23 Left 01/17/23  Hip flexion 4- 4- 4- 4  Hip extension 3- 2+ 3- 3+  Hip abduction 2 3- 2+ 3+  Hip adduction 2 2 2+ 2+  Hip internal rotation 3+ 4- 4 4  Hip external rotation 3+ 3+ 3+ 4-  Knee flexion 4- 4 4+ 4  Knee extension 4- 4- 4 * 4 *  Ankle dorsiflexion 4- 4- 4 4  Ankle plantarflexion 3+ NWB 4- NWB 4  NWB 4 NWB  Ankle inversion      Ankle eversion      (Blank rows = not tested, * = pain on resistance)  BED MOBILITY:  Difficulty with rolling on mat table but assist from PT not needed  TRANSFERS: Assistive device utilized: None  Sit to stand: Modified independence - requires B UE assist Stand to sit: Modified independence Chair to chair:  NT Floor:  NT  GAIT: Distance walked: 60 ft Assistive device utilized: None Level of assistance: SBA Gait pattern: decreased stride length, decreased hip/knee flexion- Right, decreased hip/knee flexion- Left, genu recurvatum- Right, genu recurvatum- Left, decreased trunk rotation, and wide BOS Comments: 2.72 ft/sec gait speed  RAMP: Level of Assistance:   NT Assistive device  utilized:  NT Ramp Comments:  pt reports difficulty with ambulating on declines  CURB:  Level of Assistance:  NT Assistive device utilized:  NT Curb Comments:   STAIRS: (12/27/22)  Level of Assistance: SBA  Stair Negotiation Technique: Step to Pattern Forwards on ascent, Backwards on descent  with Bilateral Rails Number of Stairs: 4  Height of Stairs: 7"  Comments: pt very hesitant with steps due to R>L knee  FUNCTIONAL TESTS:  5 times sit to stand: 20.41 sec - B UE assist necessary Timed up and go (TUG): 11.28 sec 10 meter walk test: 12.06 sec; Gait speed = 2.72 ft/sec Berg Balance Scale: 12/27/22: 39/56; 37-45 = Significant (>80%) fall risk  Functional gait assessment: 12/27/22: 14/30; < 19 = high risk fall  PATIENT SURVEYS:  ABC scale 790 / 1600 = 49.4 %   TODAY'S TREATMENT:   01/17/23 THERAPEUTIC EXERCISE: to improve flexibility, strength and mobility.  Verbal and tactile cues throughout for technique. NuStep - L4 x 6 min (UE/LE) Standing R/L hip ABD x 10 with looped RTB at ankles, UE support on back of chair Standing R/L hip extension x 10 with looped RTB at ankles, UE support on back of chair Standing R/L hip flexion march x 10 with looped RTB at midfeet, UE support on back of chair Standing R/L hip flexion SLR x 10 with looped RTB at ankles, UE support on back of chair - pt preferring this over march Standing heel/toe raises x 10  THERAPEUTIC ACTIVITIES: 5xSTS = 24.66 sec w/o UE assist; 13.81 sec with B UE assist   01/10/23 THERAPEUTIC EXERCISE: to improve flexibility, strength and mobility.  Verbal and tactile cues throughout for technique. NuStep - L6 x 6 min (UE/LE) Seated hip hinge HS stretch 2 x 30" - cues to avoid pressing knee into hyperextension Seated R/L Fitter leg press (1 black/1 blue) x 20 Seated R/L GTB HS curl/knee flexion 2 x 10  Standing R/L TKE into small green ball on wall 10 x 5" Attempted mini-wall squat + hip ADD isometric  into small green ball but deferred due to increased R knee pain Seated R/L HS isometric into blue/green striped ball 10 x 5" Seated B hip ADD isometric into small green ball 10 x 5" Supine B hip ADD isometric into small green ball 10 x 5" Bridge + B hip ADD isometric into small green ball 10 x 5" Hooklying R/LB hip ADD isometric ball squeeze + SAQ x 10 on L, x 3 on R - discontinued due to increased pain and lateral patellar tracking   01/03/23 THERAPEUTIC EXERCISE: to improve flexibility, strength and mobility.  Verbal and tactile cues throughout for technique. NuStep - L6 x 6 min (UE/LE) Standing church pews x 10 - cues to avoid knee hyperextension and to minimize fwd/back motion of torso Retro step with contralateral arm raise & single UE support on counter 2 x 10 Alt lateral step with slight lunge/knee flexion x 10, UE support on counter Standing black TB TKE x 10 bil, UE support on back of chair - cues to stop pull back with knee straight, avoiding hyperextension Standing black TB reverse TKE x 10 bil, UE support on back of chair - cues to avoid allowing band to pull knee into recurvatum Standing TKE into small ball on wall x 10 bil, UE support on back of chair Standing R/L hip ABD isometric into ball on wall x 10 each Seated black TB PF & DF 2 x 10  PATIENT EDUCATION:  Education details: HEP update - hip ADD ball squeeze isometric   Person educated: Patient Education method: Explanation, Demonstration, Verbal cues, Handouts, and MedbridgeGO code updated Education comprehension: verbalized understanding, returned demonstration, verbal cues required, and needs further education  HOME EXERCISE PROGRAM: *Access Code: [email or web address] URL: [email or web address] Date: 01/17/2023 Prepared by: Glenetta Hew  Exercises - Seated Hamstring Stretch  - 2 x daily - 7 x weekly - 3 reps - 30 sec hold - Seated Table Piriformis Stretch  - 2 x daily - 7 x weekly - 3 reps - 30 sec hold -  Seated Hip Flexor Stretch  - 2 x daily - 7 x weekly - 3 reps - 30 sec hold - [place] P[place]  - 1 x daily - 7 x weekly - 2 sets - 10 reps - 3 sec hold - Seated Ankle Plantar Flexion with Resistance Loop  - 1 x daily - 7 x weekly - 2 sets - 10 reps - 3 sec hold - Seated Ankle Dorsiflexion with Resistance  - 1 x daily - 7 x weekly - 2 sets - 10 reps - 3 sec hold - Standing Terminal Knee Extension with Resistance  - 1 x daily - 7 x weekly - 2 sets - 10 reps - 5 sec hold - Retro Step  - 1 x daily - 7 x weekly - 2 sets - 10 reps - 3 sec hold - Sidestepping  - 1 x daily - 7 x weekly - 2 sets - 10 reps - 3 sec hold - Seated Hip Adduction Squeeze with Ball  - 1 x daily - 7 x weekly - 2 sets - 10 reps - 3-5 sec hold - Supine Hip Adduction Isometric with Ball  - 1 x daily - 7 x weekly - 2 sets - 10 reps - 5 sec hold - Standing Hip Abduction with Resistance at Ankles and Counter Support  - 1 x daily - 3-4 x weekly - 2 sets - 10 reps - 3 sec hold - Standing Hip Extension with Resistance at Ankles and Counter Support  - 1 x daily - 3-4 x weekly - 2 sets - 10 reps - 3 sec hold - Standing Hip Flexion with Resistance Loop  - 1 x daily - 3-4 x weekly - 2 sets - 10 reps - 3 sec hold - Heel Toe Raises with Counter Support  - 1 x daily - 3-4 x weekly - 2 sets - 10 reps - 3 sec hold  Patient Education - Check for Safety  *MedBridgeGO access code texted to patient   ASSESSMENT:  CLINICAL IMPRESSION: Quintell reports he was unable to come to his last PT session due to increased knee pain, with R knee now hurting more than the L. He reports selective compliance with HEP, avoiding quad strengthening exercises due to pain. He continues to rely on genu recurvatum for knee stability in both stance and during gait despite cues and exercises encouraging him to unlock his knees and activate his quads. MMT testing revealing some gains in strength, however significant proximal and distal LE weakness still present contributing to  ongoing issues with balance and stability and creating increased workload on knees. He was able to add RTB resistance to his current standing hip exercises, however continued cues necessary to avoid locking knees into hyperextension. He has improved with his 5xSTS and is now able to complete the test w/o UE assist as compared to need for B UE assist on eval, although  at a slower pace, but was able to significantly reduce the time with use of B UE assist.  Jahzeel is slowly progressing toward his PT goals and will continue to benefit from skilled PT to address above deficits to improve mobility and activity tolerance with decreased pain interference.   OBJECTIVE IMPAIRMENTS: Abnormal gait, decreased activity tolerance, decreased balance, decreased coordination, decreased endurance, decreased knowledge of condition, decreased mobility, difficulty walking, decreased ROM, decreased strength, increased edema, increased fascial restrictions, impaired perceived functional ability, increased muscle spasms, impaired flexibility, impaired sensation, improper body mechanics, postural dysfunction, and pain.   ACTIVITY LIMITATIONS: carrying, lifting, bending, standing, squatting, stairs, transfers, bed mobility, locomotion level, and caring for others  PARTICIPATION LIMITATIONS: meal prep, cleaning, laundry, shopping, community activity, and occupation  PERSONAL FACTORS: Past/current experiences, Time since onset of injury/illness/exacerbation, and 3+ comorbidities: AAA, chest pain on exertion, TIA, HTN, CKD, lumbar DDD/HNP with radiculopathy, obesity  are also affecting patient's functional outcome.   REHAB POTENTIAL: Good  CLINICAL DECISION MAKING: Evolving/moderate complexity  EVALUATION COMPLEXITY: Moderate   GOALS: Goals reviewed with patient? Yes  SHORT TERM GOALS: Target date: 01/22/2023   Patient will be independent with initial HEP to improve outcomes and carryover.  Baseline:  Goal status: IN  PROGRESS  01/17/23 - Pt reports selective performance of HEP  2.  Patient will be educated on strategies to decrease risk of falls.  Baseline:  Goal status: MET  01/17/23  3.  Patient will demonstrate decreased 5xSTS time to </= 15 sec to decrease risk for falls with transitional mobility and for improved efficiency and safety with transfers. Baseline: 20.41 sec with B UE assist required Goal status: PARTIALLY MET  01/17/23 - 5xSTS = 24.66 sec w/o UE assist; 13.81 sec with B UE assist  LONG TERM GOALS: Target date: 02/19/2023   Patient will be independent with advanced/ongoing HEP to facilitate ability to maintain/progress functional gains from skilled physical therapy services. Baseline:  Goal status: IN PROGRESS  2.  Patient will be able to ambulate 600' with or w/o LRAD on variable surfaces with good safety to access community.  Baseline:  Goal status: IN PROGRESS  3.  Patient will be able to step up/down curb safely with LRAD for safety with community ambulation.  Baseline:  Goal status: IN PROGRESS   4.  Patient will demonstrate improved B LE strength to >/= 4 to 4+/5 for improved stability and ease of mobility. Baseline: Refer to above MMT table Goal status: IN PROGRESS  01/17/23 - MMT improving but still with significant ongoing LE weakness  5.  Patient will improve 5x STS time to </= 13.6 seconds w/o need for UE assist for improved efficiency and safety with transfers Baseline: 20.41 sec with B UE assist required Goal status: IN PROGRESS  01/17/23 - 5xSTS = 24.66 sec w/o UE assist; 13.81 sec with B UE assist  6.  Patient will demonstrate gait speed of >/= 3.54 ft/sec to be a safe community ambulator with decreased risk for recurrent falls.  Baseline: 2.72 ft/sec Goal status: IN PROGRESS  7.  Patient will improve Berg score by >/= 8 points to improve safety and stability with ADLs in standing and reduce risk for falls.   Baseline: 39/56 (12/27/22) Goal status: IN PROGRESS  8.  Patient will improve FGA score to at least 19/30 to improve gait stability and reduce risk for falls. Baseline: 14/30 (12/27/22) Goal status: IN PROGRESS  9.  Patient will report >/= 68% on ABC scale to demonstrate improved  functional ability. Baseline: 790 / 1600 = 49.4 % Goal status: IN PROGRESS   PLAN:  PT FREQUENCY: 2x/week  PT DURATION: 8 weeks  PLANNED INTERVENTIONS: Therapeutic exercises, Therapeutic activity, Neuromuscular re-education, Balance training, Gait training, Patient/Family education, Self Care, Joint mobilization, Stair training, DME instructions, Aquatic Therapy, Dry Needling, Electrical stimulation, Spinal mobilization, Cryotherapy, Moist heat, Taping, Vasopneumatic device, Traction, Ultrasound, Ionotophoresis 4mg /ml Dexamethasone, Manual therapy, and Re-evaluation  PLAN FOR NEXT SESSION:  progress ROM/flexibility along with lumbopelvic and LE strengthening, esp quad control in standing avoid genu recurvatum; initiate balance activities as tolerated; review and update HEP as needed   Marry Guan, PT 01/17/2023, 6:23 PM

## 2023-01-22 ENCOUNTER — Ambulatory Visit: Payer: Medicare Other | Admitting: Physical Therapy

## 2023-01-22 ENCOUNTER — Ambulatory Visit (INDEPENDENT_AMBULATORY_CARE_PROVIDER_SITE_OTHER): Payer: Medicare Other | Admitting: Podiatry

## 2023-01-22 DIAGNOSIS — R269 Unspecified abnormalities of gait and mobility: Secondary | ICD-10-CM | POA: Diagnosis not present

## 2023-01-23 NOTE — Progress Notes (Signed)
  Subjective:  Patient ID: Joseph Compton, male    DOB: 14-Jul-1956,  MRN: [id number]  Chief Complaint  Patient presents with   Follow-up    Follow up after PT, patient states his balance is still off  and not sure if he will need orthotics or something to help.     67 y.o. male presents with the above complaint. History confirmed with patient. Has had some improvement with PT but still feels off.   Objective:  Physical Exam: warm, good capillary refill, no trophic changes or ulcerative lesions, normal DP and PT pulses, and 5 out of 5 strength, good smooth range of motion of both feet, no abnormalities  Assessment:   1. Neurologic gait dysfunction      Plan:  Patient was evaluated and treated and all questions answered.  Continue PT, which has helped. May benefit long term from a Moore Balance brace AFO. Will have him scheduled for fitting for these when our new orthotist returns.   Return if symptoms worsen or fail to improve.

## 2023-01-24 ENCOUNTER — Ambulatory Visit: Payer: Medicare Other | Admitting: Physical Therapy

## 2023-01-29 ENCOUNTER — Ambulatory Visit: Payer: Medicare Other

## 2023-01-29 DIAGNOSIS — M6281 Muscle weakness (generalized): Secondary | ICD-10-CM

## 2023-01-29 DIAGNOSIS — R2681 Unsteadiness on feet: Secondary | ICD-10-CM

## 2023-01-29 DIAGNOSIS — R2689 Other abnormalities of gait and mobility: Secondary | ICD-10-CM | POA: Diagnosis not present

## 2023-01-29 NOTE — Therapy (Signed)
OUTPATIENT PHYSICAL THERAPY TREATMENT     Patient Name: Joseph Compton MRN: [id number] DOB:Jun 09, 1956, 67 y.o., male Today's Date: 01/29/2023   END OF SESSION:  PT End of Session - 01/29/23 1453     Visit Number 7    Date for PT Re-Evaluation 02/19/23    Authorization Type Medicare & [employer]    Progress Note Due on Visit 10    PT Start Time 1449    PT Stop Time 1530    PT Time Calculation (min) 41 min    Activity Tolerance Patient tolerated treatment well;Patient limited by pain    Behavior During Therapy [facility] for tasks assessed/performed                 Past Medical History:  Diagnosis Date   Abdominal aortic aneurysm (AAA) 3.0 cm to 5.5 cm in diameter in male (HCC) 03/07/2022   PER PATIENT   Arthritis    Chest pain on exertion    Chronic kidney disease, stage 3a (HCC)    GERD (gastroesophageal reflux disease)    History of Helicobacter pylori infection    in [place]   History of transient ischemic attack (TIA)    per patient   Hyperlipemia    Hypertension    Hypertensive kidney disease with CKD (chronic kidney disease) stage I    Impaired fasting glucose    Obesity    Radiculopathy, lumbar region    Past Surgical History:  Procedure Laterality Date   MOUTH SURGERY  2000   Patient Active Problem List   Diagnosis Date Noted   Dyslipidemia, goal LDL below 70 07/27/2022   Lumbar radiculopathy 04/05/2021   Pain in left foot 09/17/2019   Degeneration of lumbar intervertebral disc 11/17/2018   Epidural lipomatosis 11/17/2018   Lumbar disc herniation with radiculopathy 11/06/2018    PCP: Geoffry Paradise, MD   REFERRING PROVIDER: Edwin Cap, DPM   REFERRING DIAG: R26.9 (ICD-10-CM) - Neurologic gait dysfunction  Evaluate and treat for 1-2 sessions / week for 4-6 weeks or at therapist's discretion. Patient has gait dysfunction and balance problems, would like to include ROM, strengthening, stability, and gait training, mobility and balance.  Modalities PRN at therapist's discretion.   THERAPY DIAG:  Other abnormalities of gait and mobility  Unsteadiness on feet  Muscle weakness (generalized)  RATIONALE FOR EVALUATION AND TREATMENT: Rehabilitation  ONSET DATE: 4-6 yrs  NEXT MD VISIT: 01/22/23   SUBJECTIVE:  SUBJECTIVE STATEMENT: Pt reports still having trouble going up/down stairs.  PAIN: Are you having pain? Yes: NPRS scale: 4-5/10 Pain location: L>R knee Pain description: ache & tight feeling with intermittent sciatic feeling Aggravating factors: prolonged standing or walking Relieving factors: sitting down for limited periods  PERTINENT HISTORY:  AAA, chest pain on exertion, TIA, HTN, CKD, lumbar DDD/HNP with radiculopathy, obesity  PRECAUTIONS: Fall  WEIGHT BEARING RESTRICTIONS: No  FALLS:  Has patient fallen in last 6 months? 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, light lifting)  PLOF: Independent and Leisure: Rec bike x 30 min every morning, light free weights circuit training, TM occasionally  PATIENT GOALS: "better balance, esp on declines and stairs"   OBJECTIVE: (objective measures completed at initial evaluation unless otherwise dated)  DIAGNOSTIC FINDINGS:  No imaging available but OA in knees & HNP in back per pt  COGNITION: Overall cognitive status: Within functional limits for tasks assessed   SENSATION: WFL, occasional R>L sciatica  COORDINATION: Slow B heel-toe rock with limited heel lift Impaired heel to shin  bil  EDEMA:  Mild to moderate B distal LE edema  MUSCLE LENGTH: Hamstrings: mod tight R>L ITB: NT Piriformis: mod tight R>L Hip flexors: mod tight B Quads: mod tight R>L Heelcord: mod tight R>L  POSTURE:  Wide BOS with B genu recurvatum, R lateral shift  LOWER EXTREMITY ROM:    LE ROM limited by tightness as above and B knee OA Active  Right Eval Left Eval  Hip flexion    Hip extension    Hip abduction    Hip adduction    Hip internal rotation    Hip external rotation    Knee flexion 119 124  Knee extension 9 2  Ankle dorsiflexion    Ankle plantarflexion    Ankle inversion    Ankle eversion     (Blank rows = not tested)  LOWER EXTREMITY MMT:    MMT Right Eval Left Eval Right 01/17/23 Left 01/17/23  Hip flexion 4- 4- 4- 4  Hip extension 3- 2+ 3- 3+  Hip abduction 2 3- 2+ 3+  Hip adduction 2 2 2+ 2+  Hip internal rotation 3+ 4- 4 4  Hip external rotation 3+ 3+ 3+ 4-  Knee flexion 4- 4 4+ 4  Knee extension 4- 4- 4 * 4 *  Ankle dorsiflexion 4- 4- 4 4  Ankle plantarflexion 3+ NWB 4- NWB 4  NWB 4 NWB  Ankle inversion      Ankle eversion      (Blank rows = not tested, * = pain on resistance)  BED MOBILITY:  Difficulty with rolling on mat table but assist from PT not needed  TRANSFERS: Assistive device utilized: None  Sit to stand: Modified independence - requires B UE assist Stand to sit: Modified independence Chair to chair:  NT Floor:  NT  GAIT: Distance walked: 60 ft Assistive device utilized: None Level of assistance: SBA Gait pattern: decreased stride length, decreased hip/knee flexion- Right, decreased hip/knee flexion- Left, genu recurvatum- Right, genu recurvatum- Left, decreased trunk rotation, and wide BOS Comments: 2.72 ft/sec gait speed  RAMP: Level of Assistance:  NT Assistive device utilized:  NT Ramp Comments:  pt reports difficulty with ambulating on declines  CURB:  Level  of Assistance:  NT Assistive device utilized:  NT Curb  Comments:   STAIRS: (12/27/22)  Level of Assistance: SBA  Stair Negotiation Technique: Step to Pattern Forwards on ascent, Backwards on descent  with Bilateral Rails Number of Stairs: 4  Height of Stairs: 7"  Comments: pt very hesitant with steps due to R>L knee  FUNCTIONAL TESTS:  5 times sit to stand: 20.41 sec - B UE assist necessary Timed up and go (TUG): 11.28 sec 10 meter walk test: 12.06 sec; Gait speed = 2.72 ft/sec Berg Balance Scale: 12/27/22: 39/56; 37-45 = Significant (>80%) fall risk  Functional gait assessment: 12/27/22: 14/30; < 19 = high risk fall  PATIENT SURVEYS:  ABC scale 790 / 1600 = 49.4 %   TODAY'S TREATMENT:  01/29/23 THERAPEUTIC EXERCISE: to improve flexibility, strength and mobility.  Verbal and tactile cues throughout for technique. Rec Bike L4x63min Seated hip up/over squishy toy x 10 bil Isometric leg press 10x bil Seated hamstring curls x 10 green TB Seated marching green TB x 10  Seated hip abd green TB x 10 L LE; pain on R Seated hip flexor stretch x 30 sec bil  01/17/23 THERAPEUTIC EXERCISE: to improve flexibility, strength and mobility.  Verbal and tactile cues throughout for technique. NuStep - L4 x 6 min (UE/LE) Standing R/L hip ABD x 10 with looped RTB at ankles, UE support on back of chair Standing R/L hip extension x 10 with looped RTB at ankles, UE support on back of chair Standing R/L hip flexion march x 10 with looped RTB at midfeet, UE support on back of chair Standing R/L hip flexion SLR x 10 with looped RTB at ankles, UE support on back of chair - pt preferring this over march Standing heel/toe raises x 10  THERAPEUTIC ACTIVITIES: 5xSTS = 24.66 sec w/o UE assist; 13.81 sec with B UE assist   01/10/23 THERAPEUTIC EXERCISE: to improve flexibility, strength and mobility.  Verbal and tactile cues throughout for technique. NuStep - L6 x 6 min (UE/LE) Seated hip hinge HS stretch 2 x 30" - cues to avoid pressing knee into  hyperextension Seated R/L Fitter leg press (1 black/1 blue) x 20 Seated R/L GTB HS curl/knee flexion 2 x 10  Standing R/L TKE into small green ball on wall 10 x 5" Attempted mini-wall squat + hip ADD isometric into small green ball but deferred due to increased R knee pain Seated R/L HS isometric into blue/green striped ball 10 x 5" Seated B hip ADD isometric into small green ball 10 x 5" Supine B hip ADD isometric into small green ball 10 x 5" Bridge + B hip ADD isometric into small green ball 10 x 5" Hooklying R/LB hip ADD isometric ball squeeze + SAQ x 10 on L, x 3 on R - discontinued due to increased pain and lateral patellar tracking   01/03/23 THERAPEUTIC EXERCISE: to improve flexibility, strength and mobility.  Verbal and tactile cues throughout for technique. NuStep - L6 x 6 min (UE/LE) Standing church pews x 10 - cues to avoid knee hyperextension and to minimize fwd/back motion of torso Retro step with contralateral arm raise & single UE support on counter 2 x 10 Alt lateral step with slight lunge/knee flexion x 10, UE support on counter Standing black TB TKE x 10 bil, UE support on back of chair - cues to stop pull back with knee straight, avoiding hyperextension Standing black TB reverse TKE x 10 bil, UE support on back of chair -  cues to avoid allowing band to pull knee into recurvatum Standing TKE into small ball on wall x 10 bil, UE support on back of chair Standing R/L hip ABD isometric into ball on wall x 10 each Seated black TB PF & DF 2 x 10    PATIENT EDUCATION:  Education details: HEP update - hip ADD ball squeeze isometric   Person educated: Patient Education method: Explanation, Demonstration, Verbal cues, Handouts, and MedbridgeGO code updated Education comprehension: verbalized understanding, returned demonstration, verbal cues required, and needs further education  HOME EXERCISE PROGRAM: *Access Code: [email or web address] URL: [email or web address] Date:  01/17/2023 Prepared by: Glenetta Hew  Exercises - Seated Hamstring Stretch  - 2 x daily - 7 x weekly - 3 reps - 30 sec hold - Seated Table Piriformis Stretch  - 2 x daily - 7 x weekly - 3 reps - 30 sec hold - Seated Hip Flexor Stretch  - 2 x daily - 7 x weekly - 3 reps - 30 sec hold - [employer] Pew  - 1 x daily - 7 x weekly - 2 sets - 10 reps - 3 sec hold - Seated Ankle Plantar Flexion with Resistance Loop  - 1 x daily - 7 x weekly - 2 sets - 10 reps - 3 sec hold - Seated Ankle Dorsiflexion with Resistance  - 1 x daily - 7 x weekly - 2 sets - 10 reps - 3 sec hold - Standing Terminal Knee Extension with Resistance  - 1 x daily - 7 x weekly - 2 sets - 10 reps - 5 sec hold - Retro Step  - 1 x daily - 7 x weekly - 2 sets - 10 reps - 3 sec hold - Sidestepping  - 1 x daily - 7 x weekly - 2 sets - 10 reps - 3 sec hold - Seated Hip Adduction Squeeze with Ball  - 1 x daily - 7 x weekly - 2 sets - 10 reps - 3-5 sec hold - Supine Hip Adduction Isometric with Ball  - 1 x daily - 7 x weekly - 2 sets - 10 reps - 5 sec hold - Standing Hip Abduction with Resistance at Ankles and Counter Support  - 1 x daily - 3-4 x weekly - 2 sets - 10 reps - 3 sec hold - Standing Hip Extension with Resistance at Ankles and Counter Support  - 1 x daily - 3-4 x weekly - 2 sets - 10 reps - 3 sec hold - Standing Hip Flexion with Resistance Loop  - 1 x daily - 3-4 x weekly - 2 sets - 10 reps - 3 sec hold - Heel Toe Raises with Counter Support  - 1 x daily - 3-4 x weekly - 2 sets - 10 reps - 3 sec hold  Patient Education - Check for Safety  *MedBridgeGO access code texted to patient   ASSESSMENT:  CLINICAL IMPRESSION: Joseph Compton arrived being limited by B Knee pain R>L. Focused on sitting exercises as he just got off of work so standing really aggravated his knee. He did fairly well with the exercises but required modifications to avoid painful ROM. Attempted LAQ but this caused pain in both knees, the isometrics worked better for  him. He did seem to be more limited with stand ex from working earlier today. Abram is slowly progressing toward his PT goals and will continue to benefit from skilled PT to address above deficits to improve mobility and activity tolerance with decreased pain interference.  OBJECTIVE IMPAIRMENTS: Abnormal gait, decreased activity tolerance, decreased balance, decreased coordination, decreased endurance, decreased knowledge of condition, decreased mobility, difficulty walking, decreased ROM, decreased strength, increased edema, increased fascial restrictions, impaired perceived functional ability, increased muscle spasms, impaired flexibility, impaired sensation, improper body mechanics, postural dysfunction, and pain.   ACTIVITY LIMITATIONS: carrying, lifting, bending, standing, squatting, stairs, transfers, bed mobility, locomotion level, and caring for others  PARTICIPATION LIMITATIONS: meal prep, cleaning, laundry, shopping, community activity, and occupation  PERSONAL FACTORS: Past/current experiences, Time since onset of injury/illness/exacerbation, and 3+ comorbidities: AAA, chest pain on exertion, TIA, HTN, CKD, lumbar DDD/HNP with radiculopathy, obesity  are also affecting patient's functional outcome.   REHAB POTENTIAL: Good  CLINICAL DECISION MAKING: Evolving/moderate complexity  EVALUATION COMPLEXITY: Moderate   GOALS: Goals reviewed with patient? Yes  SHORT TERM GOALS: Target date: 01/22/2023   Patient will be independent with initial HEP to improve outcomes and carryover.  Baseline:  Goal status: IN PROGRESS  01/17/23 - Pt reports selective performance of HEP  2.  Patient will be educated on strategies to decrease risk of falls.  Baseline:  Goal status: MET  01/17/23  3.  Patient will demonstrate decreased 5xSTS time to </= 15 sec to decrease risk for falls with transitional mobility and for improved efficiency and safety with transfers. Baseline: 20.41 sec with B UE assist  required Goal status: PARTIALLY MET  01/17/23 - 5xSTS = 24.66 sec w/o UE assist; 13.81 sec with B UE assist  LONG TERM GOALS: Target date: 02/19/2023   Patient will be independent with advanced/ongoing HEP to facilitate ability to maintain/progress functional gains from skilled physical therapy services. Baseline:  Goal status: IN PROGRESS  2.  Patient will be able to ambulate 600' with or w/o LRAD on variable surfaces with good safety to access community.  Baseline:  Goal status: IN PROGRESS  3.  Patient will be able to step up/down curb safely with LRAD for safety with community ambulation.  Baseline:  Goal status: IN PROGRESS- 01/29/23- pt able to do but pain with stepping off of curb  4.  Patient will demonstrate improved B LE strength to >/= 4 to 4+/5 for improved stability and ease of mobility. Baseline: Refer to above MMT table Goal status: IN PROGRESS  01/17/23 - MMT improving but still with significant ongoing LE weakness  5.  Patient will improve 5x STS time to </= 13.6 seconds w/o need for UE assist for improved efficiency and safety with transfers Baseline: 20.41 sec with B UE assist required Goal status: IN PROGRESS  01/17/23 - 5xSTS = 24.66 sec w/o UE assist; 13.81 sec with B UE assist  6.  Patient will demonstrate gait speed of >/= 3.54 ft/sec to be a safe community ambulator with decreased risk for recurrent falls.  Baseline: 2.72 ft/sec Goal status: IN PROGRESS  7.  Patient will improve Berg score by >/= 8 points to improve safety and stability with ADLs in standing and reduce risk for falls.   Baseline: 39/56 (12/27/22) Goal status: IN PROGRESS  8. Patient will improve FGA score to at least 19/30 to improve gait stability and reduce risk for falls. Baseline: 14/30 (12/27/22) Goal status: IN PROGRESS  9.  Patient will report >/= 68% on ABC scale to demonstrate improved functional ability. Baseline: 790 / 1600 = 49.4 % Goal status: IN PROGRESS   PLAN:  PT FREQUENCY:  2x/week  PT DURATION: 8 weeks  PLANNED INTERVENTIONS: Therapeutic exercises, Therapeutic activity, Neuromuscular re-education, Balance training, Gait training, Patient/Family education,  Self Care, Joint mobilization, Stair training, DME instructions, Aquatic Therapy, Dry Needling, Electrical stimulation, Spinal mobilization, Cryotherapy, Moist heat, Taping, Vasopneumatic device, Traction, Ultrasound, Ionotophoresis 4mg /ml Dexamethasone, Manual therapy, and Re-evaluation  PLAN FOR NEXT SESSION:  progress ROM/flexibility along with lumbopelvic and LE strengthening, esp quad control in standing avoid genu recurvatum; initiate balance activities as tolerated; review and update HEP as needed   Darleene Cleaver, PTA 01/29/2023, 3:34 PM

## 2023-01-31 ENCOUNTER — Ambulatory Visit: Payer: Medicare Other | Admitting: Physical Therapy

## 2023-01-31 ENCOUNTER — Encounter: Payer: Self-pay | Admitting: Physical Therapy

## 2023-01-31 DIAGNOSIS — R2681 Unsteadiness on feet: Secondary | ICD-10-CM | POA: Diagnosis not present

## 2023-01-31 DIAGNOSIS — M6281 Muscle weakness (generalized): Secondary | ICD-10-CM

## 2023-01-31 DIAGNOSIS — R2689 Other abnormalities of gait and mobility: Secondary | ICD-10-CM | POA: Diagnosis not present

## 2023-01-31 NOTE — Therapy (Signed)
OUTPATIENT PHYSICAL THERAPY TREATMENT     Patient Name: Joseph Compton MRN: [id number] DOB:05-05-1956, 67 y.o., male Today's Date: 01/31/2023   END OF SESSION:  PT End of Session - 01/31/23 1532     Visit Number 8    Date for PT Re-Evaluation 02/19/23    Authorization Type Medicare & [employer]    Progress Note Due on Visit 10    PT Start Time 1532    PT Stop Time 1615    PT Time Calculation (min) 43 min    Activity Tolerance Patient tolerated treatment well    Behavior During Therapy WFL for tasks assessed/performed                 Past Medical History:  Diagnosis Date   Abdominal aortic aneurysm (AAA) 3.0 cm to 5.5 cm in diameter in male (HCC) 03/07/2022   PER PATIENT   Arthritis    Chest pain on exertion    Chronic kidney disease, stage 3a (HCC)    GERD (gastroesophageal reflux disease)    History of Helicobacter pylori infection    in [place]   History of transient ischemic attack (TIA)    per patient   Hyperlipemia    Hypertension    Hypertensive kidney disease with CKD (chronic kidney disease) stage I    Impaired fasting glucose    Obesity    Radiculopathy, lumbar region    Past Surgical History:  Procedure Laterality Date   MOUTH SURGERY  2000   Patient Active Problem List   Diagnosis Date Noted   Dyslipidemia, goal LDL below 70 07/27/2022   Lumbar radiculopathy 04/05/2021   Pain in left foot 09/17/2019   Degeneration of lumbar intervertebral disc 11/17/2018   Epidural lipomatosis 11/17/2018   Lumbar disc herniation with radiculopathy 11/06/2018    PCP: Geoffry Paradise, MD   REFERRING PROVIDER: Edwin Cap, DPM   REFERRING DIAG: R26.9 (ICD-10-CM) - Neurologic gait dysfunction  Evaluate and treat for 1-2 sessions / week for 4-6 weeks or at therapist's discretion. Patient has gait dysfunction and balance problems, would like to include ROM, strengthening, stability, and gait training, mobility and balance. Modalities PRN at  therapist's discretion.   THERAPY DIAG:  Other abnormalities of gait and mobility  Unsteadiness on feet  Muscle weakness (generalized)  RATIONALE FOR EVALUATION AND TREATMENT: Rehabilitation  ONSET DATE: 4-6 yrs  NEXT MD VISIT: 01/22/23   SUBJECTIVE:  SUBJECTIVE STATEMENT: Pt reports the R knee has been more.  PAIN: Are you having pain? Yes: NPRS scale: 5/10 Pain location: R>L knee Pain description: ache & tight feeling with intermittent sciatic feeling Aggravating factors: prolonged standing or walking Relieving factors: sitting down for limited periods  Are you having pain? Yes: NPRS scale: 3/10 Pain location: midline low back with slight radiation into R buttock Pain description: dull, stiff Aggravating factors: unknown - anything standing up while I'm working? Relieving factors: nothing, sometimes the recumbent bike  PERTINENT HISTORY:  AAA, chest pain on exertion, TIA, HTN, CKD, lumbar DDD/HNP with radiculopathy, obesity  PRECAUTIONS: Fall  WEIGHT BEARING RESTRICTIONS: No  FALLS:  Has patient fallen in last 6 months? 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, light lifting)  PLOF: Independent and Leisure: Rec bike x 30 min every morning, light free weights circuit training, TM occasionally  PATIENT GOALS: "better balance, esp on declines and stairs"   OBJECTIVE: (objective measures completed at initial evaluation unless otherwise dated)  DIAGNOSTIC FINDINGS:  No imaging available but OA in knees & HNP  in back per pt  COGNITION: Overall cognitive status: Within functional limits for tasks assessed   SENSATION: WFL, occasional R>L sciatica  COORDINATION: Slow B heel-toe rock with limited heel lift Impaired heel to shin bil  EDEMA:  Mild to moderate B distal LE edema  MUSCLE LENGTH: Hamstrings: mod tight R>L ITB: NT Piriformis: mod tight R>L Hip flexors: mod tight B Quads: mod tight R>L Heelcord: mod tight R>L  POSTURE:  Wide BOS with B genu recurvatum, R lateral shift  LOWER EXTREMITY ROM:    LE ROM limited by tightness as above and B knee OA Active  Right Eval Left Eval  Hip flexion    Hip extension    Hip abduction    Hip adduction    Hip internal rotation    Hip external rotation    Knee flexion 119 124  Knee extension 9 2  Ankle dorsiflexion    Ankle plantarflexion    Ankle inversion    Ankle eversion     (Blank rows = not tested)  LOWER EXTREMITY MMT:    MMT Right Eval Left Eval Right 01/17/23 Left 01/17/23  Hip flexion 4- 4- 4- 4  Hip extension 3- 2+ 3- 3+  Hip abduction 2 3- 2+ 3+  Hip adduction 2 2 2+ 2+  Hip internal rotation 3+ 4- 4 4  Hip external rotation 3+ 3+ 3+ 4-  Knee flexion 4- 4 4+ 4  Knee extension 4- 4- 4 * 4 *  Ankle dorsiflexion 4- 4- 4 4  Ankle plantarflexion 3+ NWB 4- NWB 4  NWB 4 NWB  Ankle inversion      Ankle eversion      (Blank rows = not tested, * = pain on resistance)  BED MOBILITY:  Difficulty with rolling on mat table but assist from PT not needed  TRANSFERS: Assistive device utilized: None  Sit to stand: Modified independence - requires B UE assist Stand to sit: Modified independence Chair to chair:  NT Floor:  NT  GAIT: Distance walked: 60 ft Assistive device utilized: None Level of assistance: SBA Gait pattern: decreased stride length, decreased hip/knee flexion- Right, decreased hip/knee flexion- Left, genu recurvatum-  Right, genu recurvatum- Left, decreased trunk rotation, and wide BOS Comments: 2.72  ft/sec gait speed  RAMP: Level of Assistance:  NT Assistive device utilized:  NT Ramp Comments:  pt reports difficulty with ambulating on declines  CURB:  Level of Assistance:  NT Assistive device utilized:  NT Curb Comments:   STAIRS: (12/27/22)  Level of Assistance: SBA  Stair Negotiation Technique: Step to Pattern Forwards on ascent, Backwards on descent  with Bilateral Rails Number of Stairs: 4  Height of Stairs: 7"  Comments: pt very hesitant with steps due to R>L knee  FUNCTIONAL TESTS:  5 times sit to stand: 20.41 sec - B UE assist necessary Timed up and go (TUG): 11.28 sec 10 meter walk test: 12.06 sec; Gait speed = 2.72 ft/sec Berg Balance Scale: 12/27/22: 39/56; 37-45 = Significant (>80%) fall risk  Functional gait assessment: 12/27/22: 14/30; < 19 = high risk fall  01/31/23 Berg = 48/56; 46-51 moderate risk for falls (>50%)   PATIENT SURVEYS:  ABC scale 790 / 1600 = 49.4 %   TODAY'S TREATMENT:   01/31/23 THERAPEUTIC EXERCISE: to improve flexibility, strength and mobility.  Verbal and tactile cues throughout for technique. NuStep - L5 x 6 min (UE/LE) Seated GTB march with looped band just above knees 2 x 10  Seated GTB B hip ABD/ER with looped band just below knees 2 x 10 Standing R/L hip ABD x 10 with looped GTB at ankles, UE support on back of chair Standing R/L hip extension x 10 with looped GTB at ankles, UE support on back of chair Standing R/L hip flexion march x 10 with looped GTB at [employer], UE support on back of chair B side-stepping with looped GTB at ankles 3 x 10 ft along counter  THERAPEUTIC ACTIVITIES: Berg = 48/56; 46-51 moderate risk for falls (>50%)     01/29/23 THERAPEUTIC EXERCISE: to improve flexibility, strength and mobility.  Verbal and tactile cues throughout for technique. Rec Bike L4x5min Seated hip up/over squishy toy x 10 bil Isometric leg press 10x bil Seated hamstring curls x 10 green TB Seated marching green TB x 10  Seated  hip abd green TB x 10 L LE; pain on R Seated hip flexor stretch x 30 sec bil   01/17/23 THERAPEUTIC EXERCISE: to improve flexibility, strength and mobility.  Verbal and tactile cues throughout for technique. NuStep - L4 x 6 min (UE/LE) Standing R/L hip ABD x 10 with looped RTB at ankles, UE support on back of chair Standing R/L hip extension x 10 with looped RTB at ankles, UE support on back of chair Standing R/L hip flexion march x 10 with looped RTB at midfeet, UE support on back of chair Standing R/L hip flexion SLR x 10 with looped RTB at ankles, UE support on back of chair - pt preferring this over march Standing heel/toe raises x 10  THERAPEUTIC ACTIVITIES: 5xSTS = 24.66 sec w/o UE assist; 13.81 sec with B UE assist   PATIENT EDUCATION:  Education details: HEP update - hip ADD ball squeeze isometric   Person educated: Patient Education method: Explanation, Demonstration, Verbal cues, Handouts, and MedbridgeGO code updated Education comprehension: verbalized understanding, returned demonstration, verbal cues required, and needs further education  HOME EXERCISE PROGRAM: *Access Code: [email or web address] URL: [email or web address] Date: 01/17/2023 Prepared by: Glenetta Hew  Exercises - Seated Hamstring Stretch  - 2 x daily - 7 x weekly - 3 reps - 30 sec hold - Seated Table Piriformis Stretch  - 2 x daily -  7 x weekly - 3 reps - 30 sec hold - Seated Hip Flexor Stretch  - 2 x daily - 7 x weekly - 3 reps - 30 sec hold - [place]  - 1 x daily - 7 x weekly - 2 sets - 10 reps - 3 sec hold - Seated Ankle Plantar Flexion with Resistance Loop  - 1 x daily - 7 x weekly - 2 sets - 10 reps - 3 sec hold - Seated Ankle Dorsiflexion with Resistance  - 1 x daily - 7 x weekly - 2 sets - 10 reps - 3 sec hold - Standing Terminal Knee Extension with Resistance  - 1 x daily - 7 x weekly - 2 sets - 10 reps - 5 sec hold - Retro Step  - 1 x daily - 7 x weekly - 2 sets - 10 reps - 3 sec hold -  Sidestepping  - 1 x daily - 7 x weekly - 2 sets - 10 reps - 3 sec hold - Seated Hip Adduction Squeeze with Ball  - 1 x daily - 7 x weekly - 2 sets - 10 reps - 3-5 sec hold - Supine Hip Adduction Isometric with Ball  - 1 x daily - 7 x weekly - 2 sets - 10 reps - 5 sec hold - Standing Hip Abduction with Resistance at Ankles and Counter Support  - 1 x daily - 3-4 x weekly - 2 sets - 10 reps - 3 sec hold - Standing Hip Extension with Resistance at Ankles and Counter Support  - 1 x daily - 3-4 x weekly - 2 sets - 10 reps - 3 sec hold - Standing Hip Flexion with Resistance Loop  - 1 x daily - 3-4 x weekly - 2 sets - 10 reps - 3 sec hold - Heel Toe Raises with Counter Support  - 1 x daily - 3-4 x weekly - 2 sets - 10 reps - 3 sec hold  Patient Education - Check for Safety  *MedBridgeGO access code texted to patient   ASSESSMENT:  CLINICAL IMPRESSION: [place] reports continued R>L knee pain along with midline LBP with R radiculopathy today. He reports selective performance of HEP and admits to not using TB resistance as prescribed, therefore reviewed some of the seated and standing exercises from his HEP, highlighting the importance of the added resistance in building muscle strength for increased stability and activity tolerance. Cues still needs during standing exercises/activities to avoid locking knees in genu recurvatum - less prominent on L with less pain also reported on L. Niel Hummer with score improved from 39/56 to 48/56, achieving LTG #6. Will continue to progress strengthening program in upcoming visits incorporating machine resisted exercises as he would like to work out more in the gym.   OBJECTIVE IMPAIRMENTS: Abnormal gait, decreased activity tolerance, decreased balance, decreased coordination, decreased endurance, decreased knowledge of condition, decreased mobility, difficulty walking, decreased ROM, decreased strength, increased edema, increased fascial restrictions, impaired  perceived functional ability, increased muscle spasms, impaired flexibility, impaired sensation, improper body mechanics, postural dysfunction, and pain.   ACTIVITY LIMITATIONS: carrying, lifting, bending, standing, squatting, stairs, transfers, bed mobility, locomotion level, and caring for others  PARTICIPATION LIMITATIONS: meal prep, cleaning, laundry, shopping, community activity, and occupation  PERSONAL FACTORS: Past/current experiences, Time since onset of injury/illness/exacerbation, and 3+ comorbidities: AAA, chest pain on exertion, TIA, HTN, CKD, lumbar DDD/HNP with radiculopathy, obesity  are also affecting patient's functional outcome.   REHAB POTENTIAL: Good  CLINICAL  DECISION MAKING: Evolving/moderate complexity  EVALUATION COMPLEXITY: Moderate   GOALS: Goals reviewed with patient? Yes  SHORT TERM GOALS: Target date: 01/22/2023   Patient will be independent with initial HEP to improve outcomes and carryover.  Baseline:  Goal status: IN PROGRESS  01/31/23 - Pt still reporting selective performance of HEP  2.  Patient will be educated on strategies to decrease risk of falls.  Baseline:  Goal status: MET  01/17/23  3.  Patient will demonstrate decreased 5xSTS time to </= 15 sec to decrease risk for falls with transitional mobility and for improved efficiency and safety with transfers. Baseline: 20.41 sec with B UE assist required Goal status: PARTIALLY MET  01/17/23 - 5xSTS = 24.66 sec w/o UE assist; 13.81 sec with B UE assist  LONG TERM GOALS: Target date: 02/19/2023   Patient will be independent with advanced/ongoing HEP to facilitate ability to maintain/progress functional gains from skilled physical therapy services. Baseline:  Goal status: IN PROGRESS  2.  Patient will be able to ambulate 600' with or w/o LRAD on variable surfaces with good safety to access community.  Baseline:  Goal status: IN PROGRESS  3.  Patient will be able to step up/down curb safely with LRAD  for safety with community ambulation.  Baseline:  Goal status: IN PROGRESS- 01/29/23- pt able to do but pain with stepping off of curb  4.  Patient will demonstrate improved B LE strength to >/= 4 to 4+/5 for improved stability and ease of mobility. Baseline: Refer to above MMT table Goal status: IN PROGRESS  01/17/23 - MMT improving but still with significant ongoing LE weakness  5.  Patient will improve 5x STS time to </= 13.6 seconds w/o need for UE assist for improved efficiency and safety with transfers Baseline: 20.41 sec with B UE assist required Goal status: IN PROGRESS  01/17/23 - 5xSTS = 24.66 sec w/o UE assist; 13.81 sec with B UE assist  6.  Patient will demonstrate gait speed of >/= 3.54 ft/sec to be a safe community ambulator with decreased risk for recurrent falls.  Baseline: 2.72 ft/sec Goal status: IN PROGRESS  7.  Patient will improve Berg score by >/= 8 points to improve safety and stability with ADLs in standing and reduce risk for falls.   Baseline: 39/56 (12/27/22) Goal status: MET  01/31/23 - Berg = 48/56  8. Patient will improve FGA score to at least 19/30 to improve gait stability and reduce risk for falls. Baseline: 14/30 (12/27/22) Goal status: IN PROGRESS  9.  Patient will report >/= 68% on ABC scale to demonstrate improved functional ability. Baseline: 790 / 1600 = 49.4 % Goal status: IN PROGRESS   PLAN:  PT FREQUENCY: 2x/week  PT DURATION: 8 weeks  PLANNED INTERVENTIONS: Therapeutic exercises, Therapeutic activity, Neuromuscular re-education, Balance training, Gait training, Patient/Family education, Self Care, Joint mobilization, Stair training, DME instructions, Aquatic Therapy, Dry Needling, Electrical stimulation, Spinal mobilization, Cryotherapy, Moist heat, Taping, Vasopneumatic device, Traction, Ultrasound, Ionotophoresis 4mg /ml Dexamethasone, Manual therapy, and Re-evaluation  PLAN FOR NEXT SESSION:  progress ROM/flexibility along with lumbopelvic  and LE strengthening, esp quad control in standing avoid genu recurvatum - add weight/gym machines for pt to carryover to gym; initiate balance activities as tolerated; review and update HEP as needed   Marry Guan, PT 01/31/2023, 4:25 PM

## 2023-02-05 ENCOUNTER — Ambulatory Visit: Payer: Medicare Other

## 2023-02-05 DIAGNOSIS — R2689 Other abnormalities of gait and mobility: Secondary | ICD-10-CM | POA: Diagnosis not present

## 2023-02-05 DIAGNOSIS — M6281 Muscle weakness (generalized): Secondary | ICD-10-CM | POA: Diagnosis not present

## 2023-02-05 DIAGNOSIS — R2681 Unsteadiness on feet: Secondary | ICD-10-CM

## 2023-02-05 NOTE — Therapy (Signed)
OUTPATIENT PHYSICAL THERAPY TREATMENT     Patient Name: Joseph Compton MRN: [id number] DOB:Jan 26, 1956, 67 y.o., male Today's Date: 02/05/2023   END OF SESSION:  PT End of Session - 02/05/23 1515     Visit Number 9    Date for PT Re-Evaluation 02/19/23    Authorization Type Medicare & [employer]    Progress Note Due on Visit 10    PT Start Time 1448    PT Stop Time 1530    PT Time Calculation (min) 42 min    Activity Tolerance Patient tolerated treatment well    Behavior During Therapy WFL for tasks assessed/performed                  Past Medical History:  Diagnosis Date   Abdominal aortic aneurysm (AAA) 3.0 cm to 5.5 cm in diameter in male (HCC) 03/07/2022   PER PATIENT   Arthritis    Chest pain on exertion    Chronic kidney disease, stage 3a (HCC)    GERD (gastroesophageal reflux disease)    History of Helicobacter pylori infection    in [place]   History of transient ischemic attack (TIA)    per patient   Hyperlipemia    Hypertension    Hypertensive kidney disease with CKD (chronic kidney disease) stage I    Impaired fasting glucose    Obesity    Radiculopathy, lumbar region    Past Surgical History:  Procedure Laterality Date   MOUTH SURGERY  2000   Patient Active Problem List   Diagnosis Date Noted   Dyslipidemia, goal LDL below 70 07/27/2022   Lumbar radiculopathy 04/05/2021   Pain in left foot 09/17/2019   Degeneration of lumbar intervertebral disc 11/17/2018   Epidural lipomatosis 11/17/2018   Lumbar disc herniation with radiculopathy 11/06/2018    PCP: Geoffry Paradise, MD   REFERRING PROVIDER: Edwin Cap, DPM   REFERRING DIAG: R26.9 (ICD-10-CM) - Neurologic gait dysfunction  Evaluate and treat for 1-2 sessions / week for 4-6 weeks or at therapist's discretion. Patient has gait dysfunction and balance problems, would like to include ROM, strengthening, stability, and gait training, mobility and balance. Modalities PRN at  therapist's discretion.   THERAPY DIAG:  Other abnormalities of gait and mobility  Unsteadiness on feet  Muscle weakness (generalized)  RATIONALE FOR EVALUATION AND TREATMENT: Rehabilitation  ONSET DATE: 4-6 yrs  NEXT MD VISIT: 01/22/23   SUBJECTIVE:  SUBJECTIVE STATEMENT: Pt reports pain in bil knees. He worked today.  PAIN: Are you having pain? Yes: NPRS scale: 5/10 Pain location: R>L knee Pain description: ache & tight feeling with intermittent sciatic feeling Aggravating factors: prolonged standing or walking Relieving factors: sitting down for limited periods  Are you having pain? Yes: NPRS scale: 3/10 Pain location: midline low back with slight radiation into R buttock Pain description: dull, stiff Aggravating factors: unknown - anything standing up while I'm working? Relieving factors: nothing, sometimes the recumbent bike  PERTINENT HISTORY:  AAA, chest pain on exertion, TIA, HTN, CKD, lumbar DDD/HNP with radiculopathy, obesity  PRECAUTIONS: Fall  WEIGHT BEARING RESTRICTIONS: No  FALLS:  Has patient fallen in last 6 months? 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, light lifting)  PLOF: Independent and Leisure: Rec bike x 30 min every morning, light free weights circuit training, TM occasionally  PATIENT GOALS: "better balance, esp on declines and stairs"   OBJECTIVE: (objective measures completed at initial evaluation unless otherwise dated)  DIAGNOSTIC FINDINGS:  No imaging available but OA in  knees & HNP in back per pt  COGNITION: Overall cognitive status: Within functional limits for tasks assessed   SENSATION: WFL, occasional R>L sciatica  COORDINATION: Slow B heel-toe rock with limited heel lift Impaired heel to shin bil  EDEMA:  Mild to moderate B distal LE edema  MUSCLE LENGTH: Hamstrings: mod tight R>L ITB: NT Piriformis: mod tight R>L Hip flexors: mod tight B Quads: mod tight R>L Heelcord: mod tight R>L  POSTURE:  Wide BOS with B genu recurvatum, R lateral shift  LOWER EXTREMITY ROM:    LE ROM limited by tightness as above and B knee OA Active  Right Eval Left Eval  Hip flexion    Hip extension    Hip abduction    Hip adduction    Hip internal rotation    Hip external rotation    Knee flexion 119 124  Knee extension 9 2  Ankle dorsiflexion    Ankle plantarflexion    Ankle inversion    Ankle eversion     (Blank rows = not tested)  LOWER EXTREMITY MMT:    MMT Right Eval Left Eval Right 01/17/23 Left 01/17/23  Hip flexion 4- 4- 4- 4  Hip extension 3- 2+ 3- 3+  Hip abduction 2 3- 2+ 3+  Hip adduction 2 2 2+ 2+  Hip internal rotation 3+ 4- 4 4  Hip external rotation 3+ 3+ 3+ 4-  Knee flexion 4- 4 4+ 4  Knee extension 4- 4- 4 * 4 *  Ankle dorsiflexion 4- 4- 4 4  Ankle plantarflexion 3+ NWB 4- NWB 4  NWB 4 NWB  Ankle inversion      Ankle eversion      (Blank rows = not tested, * = pain on resistance)  BED MOBILITY:  Difficulty with rolling on mat table but assist from PT not needed  TRANSFERS: Assistive device utilized: None  Sit to stand: Modified independence - requires B UE assist Stand to sit: Modified independence Chair to chair:  NT Floor:  NT  GAIT: Distance walked: 60 ft Assistive device utilized: None Level of assistance: SBA Gait pattern: decreased stride length, decreased hip/knee flexion- Right, decreased hip/knee flexion- Left, genu  recurvatum- Right, genu recurvatum- Left, decreased trunk rotation, and wide  BOS Comments: 2.72 ft/sec gait speed  RAMP: Level of Assistance:  NT Assistive device utilized:  NT Ramp Comments:  pt reports difficulty with ambulating on declines  CURB:  Level of Assistance:  NT Assistive device utilized:  NT Curb Comments:   STAIRS: (12/27/22)  Level of Assistance: SBA  Stair Negotiation Technique: Step to Pattern Forwards on ascent, Backwards on descent  with Bilateral Rails Number of Stairs: 4  Height of Stairs: 7"  Comments: pt very hesitant with steps due to R>L knee  FUNCTIONAL TESTS:  5 times sit to stand: 20.41 sec - B UE assist necessary Timed up and go (TUG): 11.28 sec 10 meter walk test: 12.06 sec; Gait speed = 2.72 ft/sec Berg Balance Scale: 12/27/22: 39/56; 37-45 = Significant (>80%) fall risk  Functional gait assessment: 12/27/22: 14/30; < 19 = high risk fall  01/31/23 Berg = 48/56; 46-51 moderate risk for falls (>50%)   PATIENT SURVEYS:  ABC scale 790 / 1600 = 49.4 %   TODAY'S TREATMENT:  02/05/23 THERAPEUTIC EXERCISE: to improve flexibility, strength and mobility.  Verbal and tactile cues throughout for technique. NuStep - L5 x 5 min (UE/LE) Knee flexion 20# BLE 2x10; 10# 2x10 R/L Unable to knee extension Fitter press 2x10 R/L Supine SLR 2x5 RLE; 2x10 LLE Supine bridge 2x10 Manual Therapy: to decrease muscle spasm, pain and improve mobility.  Knee joint mobilization AP/PA mobs grade I-II for pain  01/31/23 THERAPEUTIC EXERCISE: to improve flexibility, strength and mobility.  Verbal and tactile cues throughout for technique. NuStep - L5 x 6 min (UE/LE) Seated GTB march with looped band just above knees 2 x 10  Seated GTB B hip ABD/ER with looped band just below knees 2 x 10 Standing R/L hip ABD x 10 with looped GTB at ankles, UE support on back of chair Standing R/L hip extension x 10 with looped GTB at ankles, UE support on back of chair Standing R/L hip flexion march x 10 with looped GTB at [employer], UE support on back of chair B  side-stepping with looped GTB at ankles 3 x 10 ft along counter  THERAPEUTIC ACTIVITIES: Berg = 48/56; 46-51 moderate risk for falls (>50%)     01/29/23 THERAPEUTIC EXERCISE: to improve flexibility, strength and mobility.  Verbal and tactile cues throughout for technique. Rec Bike L4x40min Seated hip up/over squishy toy x 10 bil Isometric leg press 10x bil Seated hamstring curls x 10 green TB Seated marching green TB x 10  Seated hip abd green TB x 10 L LE; pain on R Seated hip flexor stretch x 30 sec bil   01/17/23 THERAPEUTIC EXERCISE: to improve flexibility, strength and mobility.  Verbal and tactile cues throughout for technique. NuStep - L4 x 6 min (UE/LE) Standing R/L hip ABD x 10 with looped RTB at ankles, UE support on back of chair Standing R/L hip extension x 10 with looped RTB at ankles, UE support on back of chair Standing R/L hip flexion march x 10 with looped RTB at midfeet, UE support on back of chair Standing R/L hip flexion SLR x 10 with looped RTB at ankles, UE support on back of chair - pt preferring this over march Standing heel/toe raises x 10  THERAPEUTIC ACTIVITIES: 5xSTS = 24.66 sec w/o UE assist; 13.81 sec with B UE assist   PATIENT EDUCATION:  Education details: HEP update - hip ADD ball squeeze isometric   Person educated: Patient Education  method: Explanation, Demonstration, Verbal cues, Handouts, and MedbridgeGO code updated Education comprehension: verbalized understanding, returned demonstration, verbal cues required, and needs further education  HOME EXERCISE PROGRAM: *Access Code: [email or web address] URL: [email or web address] Date: 01/17/2023 Prepared by: Glenetta Hew  Exercises - Seated Hamstring Stretch  - 2 x daily - 7 x weekly - 3 reps - 30 sec hold - Seated Table Piriformis Stretch  - 2 x daily - 7 x weekly - 3 reps - 30 sec hold - Seated Hip Flexor Stretch  - 2 x daily - 7 x weekly - 3 reps - 30 sec hold - [place]  - 1 x daily -  7 x weekly - 2 sets - 10 reps - 3 sec hold - Seated Ankle Plantar Flexion with Resistance Loop  - 1 x daily - 7 x weekly - 2 sets - 10 reps - 3 sec hold - Seated Ankle Dorsiflexion with Resistance  - 1 x daily - 7 x weekly - 2 sets - 10 reps - 3 sec hold - Standing Terminal Knee Extension with Resistance  - 1 x daily - 7 x weekly - 2 sets - 10 reps - 5 sec hold - Retro Step  - 1 x daily - 7 x weekly - 2 sets - 10 reps - 3 sec hold - Sidestepping  - 1 x daily - 7 x weekly - 2 sets - 10 reps - 3 sec hold - Seated Hip Adduction Squeeze with Ball  - 1 x daily - 7 x weekly - 2 sets - 10 reps - 3-5 sec hold - Supine Hip Adduction Isometric with Ball  - 1 x daily - 7 x weekly - 2 sets - 10 reps - 5 sec hold - Standing Hip Abduction with Resistance at Ankles and Counter Support  - 1 x daily - 3-4 x weekly - 2 sets - 10 reps - 3 sec hold - Standing Hip Extension with Resistance at Ankles and Counter Support  - 1 x daily - 3-4 x weekly - 2 sets - 10 reps - 3 sec hold - Standing Hip Flexion with Resistance Loop  - 1 x daily - 3-4 x weekly - 2 sets - 10 reps - 3 sec hold - Heel Toe Raises with Counter Support  - 1 x daily - 3-4 x weekly - 2 sets - 10 reps - 3 sec hold  Patient Education - Check for Safety  *MedBridgeGO access code texted to patient   ASSESSMENT:  CLINICAL IMPRESSION: Clifford reports continued R>L knee pain along with midline LBP with R radiculopathy today. He reports selective performance of HEP and admits to not using TB resistance as prescribed, therefore reviewed some of the seated and standing exercises from his HEP, highlighting the importance of the added resistance in building muscle strength for increased stability and activity tolerance. Cues still needs during standing exercises/activities to avoid locking knees in genu recurvatum - less prominent on L with less pain also reported on L. Niel Hummer with score improved from 39/56 to 48/56, achieving LTG #6. Will continue to  progress strengthening program in upcoming visits incorporating machine resisted exercises as he would like to work out more in the gym.   OBJECTIVE IMPAIRMENTS: Abnormal gait, decreased activity tolerance, decreased balance, decreased coordination, decreased endurance, decreased knowledge of condition, decreased mobility, difficulty walking, decreased ROM, decreased strength, increased edema, increased fascial restrictions, impaired perceived functional ability, increased muscle spasms, impaired flexibility, impaired sensation, improper body mechanics, postural dysfunction, and pain.  ACTIVITY LIMITATIONS: carrying, lifting, bending, standing, squatting, stairs, transfers, bed mobility, locomotion level, and caring for others  PARTICIPATION LIMITATIONS: meal prep, cleaning, laundry, shopping, community activity, and occupation  PERSONAL FACTORS: Past/current experiences, Time since onset of injury/illness/exacerbation, and 3+ comorbidities: AAA, chest pain on exertion, TIA, HTN, CKD, lumbar DDD/HNP with radiculopathy, obesity  are also affecting patient's functional outcome.   REHAB POTENTIAL: Good  CLINICAL DECISION MAKING: Evolving/moderate complexity  EVALUATION COMPLEXITY: Moderate   GOALS: Goals reviewed with patient? Yes  SHORT TERM GOALS: Target date: 01/22/2023   Patient will be independent with initial HEP to improve outcomes and carryover.  Baseline:  Goal status: IN PROGRESS  01/31/23 - Pt still reporting selective performance of HEP  2.  Patient will be educated on strategies to decrease risk of falls.  Baseline:  Goal status: MET  01/17/23  3.  Patient will demonstrate decreased 5xSTS time to </= 15 sec to decrease risk for falls with transitional mobility and for improved efficiency and safety with transfers. Baseline: 20.41 sec with B UE assist required Goal status: PARTIALLY MET  01/17/23 - 5xSTS = 24.66 sec w/o UE assist; 13.81 sec with B UE assist  LONG TERM GOALS:  Target date: 02/19/2023   Patient will be independent with advanced/ongoing HEP to facilitate ability to maintain/progress functional gains from skilled physical therapy services. Baseline:  Goal status: IN PROGRESS  2.  Patient will be able to ambulate 600' with or w/o LRAD on variable surfaces with good safety to access community.  Baseline:  Goal status: IN PROGRESS  3.  Patient will be able to step up/down curb safely with LRAD for safety with community ambulation.  Baseline:  Goal status: IN PROGRESS- 01/29/23- pt able to do but pain with stepping off of curb  4.  Patient will demonstrate improved B LE strength to >/= 4 to 4+/5 for improved stability and ease of mobility. Baseline: Refer to above MMT table Goal status: IN PROGRESS  01/17/23 - MMT improving but still with significant ongoing LE weakness  5.  Patient will improve 5x STS time to </= 13.6 seconds w/o need for UE assist for improved efficiency and safety with transfers Baseline: 20.41 sec with B UE assist required Goal status: IN PROGRESS  01/17/23 - 5xSTS = 24.66 sec w/o UE assist; 13.81 sec with B UE assist  6.  Patient will demonstrate gait speed of >/= 3.54 ft/sec to be a safe community ambulator with decreased risk for recurrent falls.  Baseline: 2.72 ft/sec Goal status: IN PROGRESS  7.  Patient will improve Berg score by >/= 8 points to improve safety and stability with ADLs in standing and reduce risk for falls.   Baseline: 39/56 (12/27/22) Goal status: MET  01/31/23 - Berg = 48/56  8. Patient will improve FGA score to at least 19/30 to improve gait stability and reduce risk for falls. Baseline: 14/30 (12/27/22) Goal status: IN PROGRESS  9.  Patient will report >/= 68% on ABC scale to demonstrate improved functional ability. Baseline: 790 / 1600 = 49.4 % Goal status: IN PROGRESS   PLAN:  PT FREQUENCY: 2x/week  PT DURATION: 8 weeks  PLANNED INTERVENTIONS: Therapeutic exercises, Therapeutic activity,  Neuromuscular re-education, Balance training, Gait training, Patient/Family education, Self Care, Joint mobilization, Stair training, DME instructions, Aquatic Therapy, Dry Needling, Electrical stimulation, Spinal mobilization, Cryotherapy, Moist heat, Taping, Vasopneumatic device, Traction, Ultrasound, Ionotophoresis 4mg /ml Dexamethasone, Manual therapy, and Re-evaluation  PLAN FOR NEXT SESSION:  progress ROM/flexibility along with lumbopelvic and LE  strengthening, esp quad control in standing avoid genu recurvatum - add weight/gym machines for pt to carryover to gym; initiate balance activities as tolerated; review and update HEP as needed   Darleene Cleaver, PTA 02/05/2023, 4:39 PM

## 2023-02-07 ENCOUNTER — Ambulatory Visit: Payer: Medicare Other | Admitting: Physical Therapy

## 2023-02-12 ENCOUNTER — Encounter: Payer: Self-pay | Admitting: Physical Therapy

## 2023-02-12 ENCOUNTER — Ambulatory Visit: Payer: Medicare Other | Attending: Podiatry | Admitting: Physical Therapy

## 2023-02-12 DIAGNOSIS — M6281 Muscle weakness (generalized): Secondary | ICD-10-CM | POA: Diagnosis not present

## 2023-02-12 DIAGNOSIS — R2689 Other abnormalities of gait and mobility: Secondary | ICD-10-CM | POA: Diagnosis not present

## 2023-02-12 DIAGNOSIS — R2681 Unsteadiness on feet: Secondary | ICD-10-CM | POA: Diagnosis not present

## 2023-02-12 NOTE — Therapy (Addendum)
OUTPATIENT PHYSICAL THERAPY TREATMENT  Progress Note/Recert Reporting Period 12/25/2022 to 02/12/2023  See note below for Objective Data and Assessment of Progress/Goals.       Patient Name: Joseph Compton MRN: [id number] DOB:Jun 10, 1956, 67 y.o., male Today's Date: 02/12/2023   END OF SESSION:  PT End of Session - 02/12/23 1451     Visit Number 10    Date for PT Re-Evaluation 03/12/23    Authorization Type Medicare & [employer] BCBS    Progress Note Due on Visit --    PT Start Time 1449    PT Stop Time 1530    PT Time Calculation (min) 41 min    Activity Tolerance Patient tolerated treatment well    Behavior During Therapy [facility] for tasks assessed/performed                  Past Medical History:  Diagnosis Date   Abdominal aortic aneurysm (AAA) 3.0 cm to 5.5 cm in diameter in male (HCC) 03/07/2022   PER PATIENT   Arthritis    Chest pain on exertion    Chronic kidney disease, stage 3a (HCC)    GERD (gastroesophageal reflux disease)    History of Helicobacter pylori infection    in [place]   History of transient ischemic attack (TIA)    per patient   Hyperlipemia    Hypertension    Hypertensive kidney disease with CKD (chronic kidney disease) stage I    Impaired fasting glucose    Obesity    Radiculopathy, lumbar region    Past Surgical History:  Procedure Laterality Date   MOUTH SURGERY  2000   Patient Active Problem List   Diagnosis Date Noted   Dyslipidemia, goal LDL below 70 07/27/2022   Lumbar radiculopathy 04/05/2021   Pain in left foot 09/17/2019   Degeneration of lumbar intervertebral disc 11/17/2018   Epidural lipomatosis 11/17/2018   Lumbar disc herniation with radiculopathy 11/06/2018    PCP: Geoffry Paradise, MD   REFERRING PROVIDER: Edwin Cap, DPM   REFERRING DIAG: R26.9 (ICD-10-CM) - Neurologic gait dysfunction  Evaluate and treat for 1-2 sessions / week for 4-6 weeks or at therapist's discretion. Patient has gait dysfunction  and balance problems, would like to include ROM, strengthening, stability, and gait training, mobility and balance. Modalities PRN at therapist's discretion.   THERAPY DIAG:  Other abnormalities of gait and mobility  Unsteadiness on feet  Muscle weakness (generalized)  RATIONALE FOR EVALUATION AND TREATMENT: Rehabilitation  ONSET DATE: 4-6 yrs  NEXT MD VISIT: 01/22/23   SUBJECTIVE:  SUBJECTIVE STATEMENT: Pt. Reports R knee is sore today, his hips feel weak, but he rides exercise bike every day so doesn't understand why they feel weak.  He is interested in [place] as heard it can help with knee pain.     PAIN: Are you having pain? Yes: NPRS scale: 4-5/10 Pain location: R>L knee Pain description: ache & tight feeling with intermittent sciatic feeling Aggravating factors: prolonged standing or walking Relieving factors: sitting down for limited periods  Are you having pain? Yes: NPRS scale: 3/10 Pain location: midline low back with slight radiation into R buttock Pain description: dull, stiff Aggravating factors: unknown - anything standing up while I'm working? Relieving factors: nothing, sometimes the recumbent bike  PERTINENT HISTORY:  AAA, chest pain on exertion, TIA, HTN, CKD, lumbar DDD/HNP with radiculopathy, obesity  PRECAUTIONS: Fall  WEIGHT BEARING RESTRICTIONS: No  FALLS:  Has patient fallen in last 6 months? 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, light lifting)  PLOF: Independent and Leisure: Rec bike x 30  min every morning, light free weights circuit training, TM occasionally  PATIENT GOALS: "better balance, esp on declines and stairs"   OBJECTIVE: (objective measures completed at initial evaluation unless otherwise dated)  DIAGNOSTIC FINDINGS:  No imaging available but OA in knees & HNP in back per pt  COGNITION: Overall cognitive status: Within functional limits for tasks assessed   SENSATION: WFL, occasional R>L sciatica  COORDINATION: Slow B heel-toe rock with limited heel lift Impaired heel to shin bil  EDEMA:  Mild to moderate B distal LE edema  MUSCLE LENGTH: Hamstrings: mod tight R>L ITB: NT Piriformis: mod tight R>L Hip flexors: mod tight B Quads: mod tight R>L Heelcord: mod tight R>L  POSTURE:  Wide BOS with B genu recurvatum, R lateral shift  LOWER EXTREMITY ROM:    LE ROM limited by tightness as above and B knee OA Active  Right Eval Left Eval  Hip flexion    Hip extension    Hip abduction    Hip adduction    Hip internal rotation    Hip external rotation    Knee flexion 119 124  Knee extension 9 2  Ankle dorsiflexion    Ankle plantarflexion    Ankle inversion    Ankle eversion     (Blank rows = not tested)  LOWER EXTREMITY MMT:    MMT Right Eval Left Eval Right 01/17/23 Left 01/17/23  Hip flexion 4- 4- 4- 4  Hip extension 3- 2+ 3- 3+  Hip abduction 2 3- 2+ 3+  Hip adduction 2 2 2+ 2+  Hip internal rotation 3+ 4- 4 4  Hip external rotation 3+ 3+ 3+ 4-  Knee flexion 4- 4 4+ 4  Knee extension 4- 4- 4 * 4 *  Ankle dorsiflexion 4- 4- 4 4  Ankle plantarflexion 3+ NWB 4- NWB 4  NWB 4 NWB  Ankle inversion      Ankle eversion      (Blank rows = not tested, * = pain on resistance)  BED MOBILITY:  Difficulty with rolling on mat table but assist from PT not needed  TRANSFERS: Assistive device utilized: None  Sit to stand: Modified independence - requires B UE assist Stand to sit: Modified independence Chair to chair:  NT Floor:   NT  GAIT: Distance walked: 60 ft Assistive device utilized: None Level of assistance: SBA Gait pattern: decreased stride length, decreased hip/knee flexion- Right, decreased hip/knee flexion- Left, genu recurvatum- Right, genu recurvatum- Left, decreased trunk rotation, and wide BOS Comments: 2.72 ft/sec gait speed  RAMP: Level of Assistance:  NT Assistive device utilized:  NT Ramp Comments:  pt reports difficulty with ambulating on declines  CURB:  Level of Assistance:  NT Assistive device utilized:  NT Curb Comments:   STAIRS: (12/27/22)  Level of Assistance: SBA  Stair Negotiation Technique: Step to Pattern Forwards on ascent, Backwards on descent  with Bilateral Rails Number of Stairs: 4  Height of Stairs: 7"  Comments: pt very hesitant with steps due to R>L knee  FUNCTIONAL TESTS:  5 times sit to stand: 20.41 sec - B UE assist necessary Timed up and go (TUG): 11.28 sec 10 meter walk test: 12.06 sec; Gait speed = 2.72 ft/sec Berg Balance Scale: 12/27/22: 39/56; 37-45 = Significant (>80%) fall risk  Functional gait assessment: 12/27/22: 14/30; < 19 = high risk fall  01/31/23 Berg = 48/56; 46-51 moderate risk for falls (>50%)   PATIENT SURVEYS:  ABC scale 790 / 1600 = 49.4 %   TODAY'S TREATMENT:  02/12/2023 Therapeutic Activity:  to assess progress towards goals.  Nustep L5 x 5 min for warm-up Gait speed 5x STS (with and without UE assist) FGA ABC Ultrasound: x 8 min to R knee  1 MHz, 1.2 w/cm2 cont x 4 min to distal quad, 3.3 MHz, 1.2 w/cm2 cont x to LCL to decrease inflammation/pain  02/05/23 THERAPEUTIC EXERCISE: to improve flexibility, strength and mobility.  Verbal and tactile cues throughout for technique. NuStep - L5 x 5 min (UE/LE) Knee flexion 20# BLE 2x10; 10# 2x10 R/L Unable to knee extension Fitter press 2x10 R/L Supine SLR 2x5 RLE; 2x10 LLE Supine bridge 2x10 Manual Therapy: to decrease muscle spasm, pain and improve mobility.  Knee joint  mobilization AP/PA mobs grade I-II for pain  01/31/23 THERAPEUTIC EXERCISE: to improve flexibility, strength and mobility.  Verbal and tactile cues throughout for technique. NuStep - L5 x 6 min (UE/LE) Seated GTB march with looped band just above knees 2 x 10  Seated GTB B hip ABD/ER with looped band just below knees 2 x 10 Standing R/L hip ABD x 10 with looped GTB at ankles, UE support on back of chair Standing R/L hip extension x 10 with looped GTB at ankles, UE support on back of chair Standing R/L hip flexion march x 10 with looped GTB at [employer], UE support on back of chair B side-stepping with looped GTB at ankles 3 x 10 ft along counter  THERAPEUTIC ACTIVITIES: Berg = 48/56; 46-51 moderate risk for falls (>50%)       PATIENT EDUCATION:  Education details: ongoing PT POC  Person educated: Patient Education method: Explanation Education comprehension: verbalized understanding  HOME EXERCISE PROGRAM: *Access Code: [email or web address] URL: [email or web address] Date: 01/17/2023 Prepared by: Glenetta Hew  Exercises - Seated Hamstring Stretch  - 2 x daily - 7 x weekly - 3 reps - 30 sec hold - Seated Table Piriformis Stretch  - 2 x daily - 7 x weekly - 3 reps - 30 sec hold - Seated Hip Flexor Stretch  - 2 x daily - 7 x weekly - 3 reps - 30 sec hold - [place]  - 1 x daily - 7 x weekly - 2 sets - 10 reps -  3 sec hold - Seated Ankle Plantar Flexion with Resistance Loop  - 1 x daily - 7 x weekly - 2 sets - 10 reps - 3 sec hold - Seated Ankle Dorsiflexion with Resistance  - 1 x daily - 7 x weekly - 2 sets - 10 reps - 3 sec hold - Standing Terminal Knee Extension with Resistance  - 1 x daily - 7 x weekly - 2 sets - 10 reps - 5 sec hold - Retro Step  - 1 x daily - 7 x weekly - 2 sets - 10 reps - 3 sec hold - Sidestepping  - 1 x daily - 7 x weekly - 2 sets - 10 reps - 3 sec hold - Seated Hip Adduction Squeeze with Ball  - 1 x daily - 7 x weekly - 2 sets - 10 reps - 3-5 sec  hold - Supine Hip Adduction Isometric with Ball  - 1 x daily - 7 x weekly - 2 sets - 10 reps - 5 sec hold - Standing Hip Abduction with Resistance at Ankles and Counter Support  - 1 x daily - 3-4 x weekly - 2 sets - 10 reps - 3 sec hold - Standing Hip Extension with Resistance at Ankles and Counter Support  - 1 x daily - 3-4 x weekly - 2 sets - 10 reps - 3 sec hold - Standing Hip Flexion with Resistance Loop  - 1 x daily - 3-4 x weekly - 2 sets - 10 reps - 3 sec hold - Heel Toe Raises with Counter Support  - 1 x daily - 3-4 x weekly - 2 sets - 10 reps - 3 sec hold  Patient Education - Check for Safety  *[email or web address] access code texted to patient   ASSESSMENT:  CLINICAL IMPRESSION: Joseph Compton is making good progress towards goals, improving 5x STS time, gait speed, and ABC score.  His score on FGA has improved to 20/30 meeting LGT #8 and decreasing fall risk.  He is still limited by R knee pain and noted gait very antalgic today.  He is exercising daily, riding bike, but not performing all the exercises, we had a discussion why the bike does not address his hip strength which is very important for balance.  We also discussed follow-up with orthopedist about his knee pain which is worsening.  He was very interested in [place], we did try this on his R knee today, and he did report it helped with pain, but ultimately discussed importance of continuing to strengthen the muscles in his hips and knees to support his knees to help prevent knee pain from worsening.  Joseph Compton is making good progress, he would benefit from continued skilled therapy for additional 2x/week for 4 more weeks to maximize benefit.    OBJECTIVE IMPAIRMENTS: Abnormal gait, decreased activity tolerance, decreased balance, decreased coordination, decreased endurance, decreased knowledge of condition, decreased mobility, difficulty walking, decreased ROM, decreased strength, increased edema, increased fascial restrictions, impaired  perceived functional ability, increased muscle spasms, impaired flexibility, impaired sensation, improper body mechanics, postural dysfunction, and pain.   ACTIVITY LIMITATIONS: carrying, lifting, bending, standing, squatting, stairs, transfers, bed mobility, locomotion level, and caring for others  PARTICIPATION LIMITATIONS: meal prep, cleaning, laundry, shopping, community activity, and occupation  PERSONAL FACTORS: Past/current experiences, Time since onset of injury/illness/exacerbation, and 3+ comorbidities: AAA, chest pain on exertion, TIA, HTN, CKD, lumbar DDD/HNP with radiculopathy, obesity  are also affecting patient's functional outcome.   REHAB POTENTIAL: Good  CLINICAL DECISION  MAKING: Evolving/moderate complexity  EVALUATION COMPLEXITY: Moderate   GOALS: Goals reviewed with patient? Yes  SHORT TERM GOALS: Target date: 01/22/2023   Patient will be independent with initial HEP to improve outcomes and carryover.  Baseline:  Goal status: IN PROGRESS  01/31/23 - Pt still reporting selective performance of HEP  2.  Patient will be educated on strategies to decrease risk of falls.  Baseline:  Goal status: MET  01/17/23  3.  Patient will demonstrate decreased 5xSTS time to </= 15 sec to decrease risk for falls with transitional mobility and for improved efficiency and safety with transfers. Baseline: 20.41 sec with B UE assist required Goal status: PARTIALLY MET  01/17/23 - 5xSTS = 24.66 sec w/o UE assist; 13.81 sec with B UE assist  LONG TERM GOALS: Target date: 02/19/2023 extended to 03/12/23   Patient will be independent with advanced/ongoing HEP to facilitate ability to maintain/progress functional gains from skilled physical therapy services. Baseline:  Goal status: IN PROGRESS 02/12/2023- selective compliance.   2.  Patient will be able to ambulate 600' with or w/o LRAD on variable surfaces with good safety to access community.  Baseline:  Goal status: MET 02/12/23- reports  walking several blocks to mile for exercise without LRAD or difficulty.   3.  Patient will be able to step up/down curb safely with LRAD for safety with community ambulation.  Baseline:  Goal status: IN PROGRESS- 01/29/23- pt able to do but pain with stepping off of curb  4.  Patient will demonstrate improved B LE strength to >/= 4 to 4+/5 for improved stability and ease of mobility. Baseline: Refer to above MMT table Goal status: IN PROGRESS  01/17/23 - MMT improving but still with significant ongoing LE weakness  5.  Patient will improve 5x STS time to </= 13.6 seconds w/o need for UE assist for improved efficiency and safety with transfers Baseline: 20.41 sec with B UE assist required Goal status: IN PROGRESS  01/17/23 - 5xSTS = 24.66 sec w/o UE assist; 13.81 sec with B UE assist 02/12/2023- 20.2 seconds without UE assist, 11.3 seconds with B UE assist.   6.  Patient will demonstrate gait speed of >/= 3.54 ft/sec to be a safe community ambulator with decreased risk for recurrent falls.  Baseline: 2.72 ft/sec Goal status: IN PROGRESS  02/12/2023 -  3.07 ft/sec, antalgic due to R knee pain.  Able to walk 4.14ft/sec for short burst when asked to walk fast.   7.  Patient will improve Berg score by >/= 8 points to improve safety and stability with ADLs in standing and reduce risk for falls.   Baseline: 39/56 (12/27/22) Goal status: MET  01/31/23 - Berg = 48/56  8. Patient will improve FGA score to at least 19/30 to improve gait stability and reduce risk for falls. Baseline: 14/30 (12/27/22) Goal status: MET 02/12/23- 20/30  9.  Patient will report >/= 68% on ABC scale to demonstrate improved functional ability. Baseline: 790 / 1600 = 49.4 % Goal status: IN PROGRESS 02/12/2023- 930/1600= 58.1%   PLAN:  PT FREQUENCY: 2x/week  PT DURATION: 8 weeks extended additional 4 weeks.   PLANNED INTERVENTIONS: Therapeutic exercises, Therapeutic activity, Neuromuscular re-education, Balance training, Gait  training, Patient/Family education, Self Care, Joint mobilization, Stair training, DME instructions, Aquatic Therapy, Dry Needling, Electrical stimulation, Spinal mobilization, Cryotherapy, Moist heat, Taping, Vasopneumatic device, Traction, Ultrasound, Ionotophoresis 4mg /ml Dexamethasone, Manual therapy, and Re-evaluation  PLAN FOR NEXT SESSION:  measure LE strength and update chart and goal; focus  on hip strengthening and functional balance training, add weight/gym machines for pt to carryover to gym;  review and update HEP as needed - review with patient to streamline.    Jena Gauss, PT 02/12/2023, 5:21 PM  PHYSICAL THERAPY DISCHARGE SUMMARY  Visits from Start of Care: 10  Current functional level related to goals / functional outcomes: Joseph Compton is making good progress towards goals, improving 5x STS time, gait speed, and ABC score.  His score on FGA has improved to 20/30 meeting LGT #8 and decreasing fall risk.    Remaining deficits:  He is still limited by R knee pain and noted gait very antalgic today.    Education / Equipment: HEP  Plan: Patient is being discharged due to no show policy after 3 no shows.    Jena Gauss, PT, DPT  03/27/2023 3:56 PM

## 2023-02-19 ENCOUNTER — Telehealth: Payer: Self-pay | Admitting: Physical Therapy

## 2023-02-19 ENCOUNTER — Ambulatory Visit: Payer: Medicare Other | Admitting: Physical Therapy

## 2023-02-26 ENCOUNTER — Ambulatory Visit: Payer: Medicare Other | Admitting: Physical Therapy

## 2023-03-05 ENCOUNTER — Encounter: Payer: Medicare Other | Admitting: Physical Therapy

## 2023-03-12 ENCOUNTER — Encounter: Payer: Medicare Other | Admitting: Physical Therapy

## 2023-03-28 DIAGNOSIS — I714 Abdominal aortic aneurysm, without rupture, unspecified: Secondary | ICD-10-CM | POA: Diagnosis not present

## 2023-03-28 DIAGNOSIS — I129 Hypertensive chronic kidney disease with stage 1 through stage 4 chronic kidney disease, or unspecified chronic kidney disease: Secondary | ICD-10-CM | POA: Diagnosis not present

## 2023-03-28 DIAGNOSIS — N1832 Chronic kidney disease, stage 3b: Secondary | ICD-10-CM | POA: Diagnosis not present

## 2023-03-29 ENCOUNTER — Other Ambulatory Visit: Payer: Self-pay | Admitting: Nephrology

## 2023-03-29 DIAGNOSIS — N1832 Chronic kidney disease, stage 3b: Secondary | ICD-10-CM

## 2023-03-29 DIAGNOSIS — I714 Abdominal aortic aneurysm, without rupture, unspecified: Secondary | ICD-10-CM

## 2023-03-29 LAB — LAB REPORT - SCANNED
Creatinine, POC: 114.3 mg/dL
EGFR: 33

## 2023-04-04 ENCOUNTER — Telehealth (HOSPITAL_BASED_OUTPATIENT_CLINIC_OR_DEPARTMENT_OTHER): Payer: Self-pay | Admitting: *Deleted

## 2023-04-04 NOTE — Telephone Encounter (Signed)
Received office note from Dr Malen Gauze Nephrologist, she d/c Olmesartan-hydrochlorothiazide and started Olmesartan 20 mg daily. Patient to monitor blood pressure and call her office in 1 week with blood pressure readings   Adjustment made to medication list and will forward to Dr Cristal Deer so she will be aware

## 2023-04-08 DIAGNOSIS — M9903 Segmental and somatic dysfunction of lumbar region: Secondary | ICD-10-CM | POA: Diagnosis not present

## 2023-04-08 DIAGNOSIS — M5136 Other intervertebral disc degeneration, lumbar region: Secondary | ICD-10-CM | POA: Diagnosis not present

## 2023-04-08 DIAGNOSIS — M9904 Segmental and somatic dysfunction of sacral region: Secondary | ICD-10-CM | POA: Diagnosis not present

## 2023-04-08 DIAGNOSIS — M9905 Segmental and somatic dysfunction of pelvic region: Secondary | ICD-10-CM | POA: Diagnosis not present

## 2023-04-09 DIAGNOSIS — M9905 Segmental and somatic dysfunction of pelvic region: Secondary | ICD-10-CM | POA: Diagnosis not present

## 2023-04-09 DIAGNOSIS — M5136 Other intervertebral disc degeneration, lumbar region: Secondary | ICD-10-CM | POA: Diagnosis not present

## 2023-04-09 DIAGNOSIS — M9903 Segmental and somatic dysfunction of lumbar region: Secondary | ICD-10-CM | POA: Diagnosis not present

## 2023-04-09 DIAGNOSIS — M9904 Segmental and somatic dysfunction of sacral region: Secondary | ICD-10-CM | POA: Diagnosis not present

## 2023-04-10 DIAGNOSIS — M9904 Segmental and somatic dysfunction of sacral region: Secondary | ICD-10-CM | POA: Diagnosis not present

## 2023-04-10 DIAGNOSIS — M9905 Segmental and somatic dysfunction of pelvic region: Secondary | ICD-10-CM | POA: Diagnosis not present

## 2023-04-10 DIAGNOSIS — M5136 Other intervertebral disc degeneration, lumbar region: Secondary | ICD-10-CM | POA: Diagnosis not present

## 2023-04-10 DIAGNOSIS — M9903 Segmental and somatic dysfunction of lumbar region: Secondary | ICD-10-CM | POA: Diagnosis not present

## 2023-04-11 DIAGNOSIS — M25562 Pain in left knee: Secondary | ICD-10-CM | POA: Diagnosis not present

## 2023-04-11 DIAGNOSIS — M545 Low back pain, unspecified: Secondary | ICD-10-CM | POA: Diagnosis not present

## 2023-04-11 DIAGNOSIS — M25561 Pain in right knee: Secondary | ICD-10-CM | POA: Diagnosis not present

## 2023-04-16 DIAGNOSIS — M9904 Segmental and somatic dysfunction of sacral region: Secondary | ICD-10-CM | POA: Diagnosis not present

## 2023-04-16 DIAGNOSIS — M9903 Segmental and somatic dysfunction of lumbar region: Secondary | ICD-10-CM | POA: Diagnosis not present

## 2023-04-16 DIAGNOSIS — M5136 Other intervertebral disc degeneration, lumbar region: Secondary | ICD-10-CM | POA: Diagnosis not present

## 2023-04-16 DIAGNOSIS — M9905 Segmental and somatic dysfunction of pelvic region: Secondary | ICD-10-CM | POA: Diagnosis not present

## 2023-04-17 DIAGNOSIS — M9905 Segmental and somatic dysfunction of pelvic region: Secondary | ICD-10-CM | POA: Diagnosis not present

## 2023-04-17 DIAGNOSIS — M9903 Segmental and somatic dysfunction of lumbar region: Secondary | ICD-10-CM | POA: Diagnosis not present

## 2023-04-17 DIAGNOSIS — M5136 Other intervertebral disc degeneration, lumbar region: Secondary | ICD-10-CM | POA: Diagnosis not present

## 2023-04-17 DIAGNOSIS — M9904 Segmental and somatic dysfunction of sacral region: Secondary | ICD-10-CM | POA: Diagnosis not present

## 2023-04-18 DIAGNOSIS — M5136 Other intervertebral disc degeneration, lumbar region: Secondary | ICD-10-CM | POA: Diagnosis not present

## 2023-04-18 DIAGNOSIS — M9903 Segmental and somatic dysfunction of lumbar region: Secondary | ICD-10-CM | POA: Diagnosis not present

## 2023-04-18 DIAGNOSIS — M9904 Segmental and somatic dysfunction of sacral region: Secondary | ICD-10-CM | POA: Diagnosis not present

## 2023-04-18 DIAGNOSIS — M9905 Segmental and somatic dysfunction of pelvic region: Secondary | ICD-10-CM | POA: Diagnosis not present

## 2023-04-22 DIAGNOSIS — M9905 Segmental and somatic dysfunction of pelvic region: Secondary | ICD-10-CM | POA: Diagnosis not present

## 2023-04-22 DIAGNOSIS — M9903 Segmental and somatic dysfunction of lumbar region: Secondary | ICD-10-CM | POA: Diagnosis not present

## 2023-04-22 DIAGNOSIS — M5136 Other intervertebral disc degeneration, lumbar region: Secondary | ICD-10-CM | POA: Diagnosis not present

## 2023-04-22 DIAGNOSIS — M9904 Segmental and somatic dysfunction of sacral region: Secondary | ICD-10-CM | POA: Diagnosis not present

## 2023-04-23 DIAGNOSIS — M9905 Segmental and somatic dysfunction of pelvic region: Secondary | ICD-10-CM | POA: Diagnosis not present

## 2023-04-23 DIAGNOSIS — M5136 Other intervertebral disc degeneration, lumbar region: Secondary | ICD-10-CM | POA: Diagnosis not present

## 2023-04-23 DIAGNOSIS — M9904 Segmental and somatic dysfunction of sacral region: Secondary | ICD-10-CM | POA: Diagnosis not present

## 2023-04-23 DIAGNOSIS — M9903 Segmental and somatic dysfunction of lumbar region: Secondary | ICD-10-CM | POA: Diagnosis not present

## 2023-04-24 DIAGNOSIS — M9905 Segmental and somatic dysfunction of pelvic region: Secondary | ICD-10-CM | POA: Diagnosis not present

## 2023-04-24 DIAGNOSIS — M9904 Segmental and somatic dysfunction of sacral region: Secondary | ICD-10-CM | POA: Diagnosis not present

## 2023-04-24 DIAGNOSIS — M9903 Segmental and somatic dysfunction of lumbar region: Secondary | ICD-10-CM | POA: Diagnosis not present

## 2023-04-24 DIAGNOSIS — M5136 Other intervertebral disc degeneration, lumbar region: Secondary | ICD-10-CM | POA: Diagnosis not present

## 2023-04-25 ENCOUNTER — Ambulatory Visit
Admission: RE | Admit: 2023-04-25 | Discharge: 2023-04-25 | Disposition: A | Discharge status: Home / Self Care | Payer: Medicare Other | Source: Ambulatory Visit | Attending: Nephrology | Admitting: Nephrology

## 2023-04-25 DIAGNOSIS — I714 Abdominal aortic aneurysm, without rupture, unspecified: Secondary | ICD-10-CM

## 2023-04-25 DIAGNOSIS — N1832 Chronic kidney disease, stage 3b: Secondary | ICD-10-CM

## 2023-04-25 DIAGNOSIS — N189 Chronic kidney disease, unspecified: Secondary | ICD-10-CM | POA: Diagnosis not present

## 2023-05-07 DIAGNOSIS — M9905 Segmental and somatic dysfunction of pelvic region: Secondary | ICD-10-CM | POA: Diagnosis not present

## 2023-05-07 DIAGNOSIS — N1832 Chronic kidney disease, stage 3b: Secondary | ICD-10-CM | POA: Diagnosis not present

## 2023-05-07 DIAGNOSIS — M9904 Segmental and somatic dysfunction of sacral region: Secondary | ICD-10-CM | POA: Diagnosis not present

## 2023-05-07 DIAGNOSIS — M9903 Segmental and somatic dysfunction of lumbar region: Secondary | ICD-10-CM | POA: Diagnosis not present

## 2023-05-07 DIAGNOSIS — M5136 Other intervertebral disc degeneration, lumbar region: Secondary | ICD-10-CM | POA: Diagnosis not present

## 2023-05-08 DIAGNOSIS — M9903 Segmental and somatic dysfunction of lumbar region: Secondary | ICD-10-CM | POA: Diagnosis not present

## 2023-05-08 DIAGNOSIS — M9905 Segmental and somatic dysfunction of pelvic region: Secondary | ICD-10-CM | POA: Diagnosis not present

## 2023-05-08 DIAGNOSIS — M9904 Segmental and somatic dysfunction of sacral region: Secondary | ICD-10-CM | POA: Diagnosis not present

## 2023-05-08 DIAGNOSIS — M5136 Other intervertebral disc degeneration, lumbar region: Secondary | ICD-10-CM | POA: Diagnosis not present

## 2023-05-09 DIAGNOSIS — M5136 Other intervertebral disc degeneration, lumbar region: Secondary | ICD-10-CM | POA: Diagnosis not present

## 2023-05-09 DIAGNOSIS — M9905 Segmental and somatic dysfunction of pelvic region: Secondary | ICD-10-CM | POA: Diagnosis not present

## 2023-05-09 DIAGNOSIS — M9903 Segmental and somatic dysfunction of lumbar region: Secondary | ICD-10-CM | POA: Diagnosis not present

## 2023-05-09 DIAGNOSIS — M9904 Segmental and somatic dysfunction of sacral region: Secondary | ICD-10-CM | POA: Diagnosis not present

## 2023-05-13 DIAGNOSIS — M9904 Segmental and somatic dysfunction of sacral region: Secondary | ICD-10-CM | POA: Diagnosis not present

## 2023-05-13 DIAGNOSIS — M5136 Other intervertebral disc degeneration, lumbar region: Secondary | ICD-10-CM | POA: Diagnosis not present

## 2023-05-13 DIAGNOSIS — M9903 Segmental and somatic dysfunction of lumbar region: Secondary | ICD-10-CM | POA: Diagnosis not present

## 2023-05-13 DIAGNOSIS — M9905 Segmental and somatic dysfunction of pelvic region: Secondary | ICD-10-CM | POA: Diagnosis not present

## 2023-05-29 DIAGNOSIS — M9904 Segmental and somatic dysfunction of sacral region: Secondary | ICD-10-CM | POA: Diagnosis not present

## 2023-05-29 DIAGNOSIS — M9903 Segmental and somatic dysfunction of lumbar region: Secondary | ICD-10-CM | POA: Diagnosis not present

## 2023-05-29 DIAGNOSIS — M9905 Segmental and somatic dysfunction of pelvic region: Secondary | ICD-10-CM | POA: Diagnosis not present

## 2023-05-30 DIAGNOSIS — M5136 Other intervertebral disc degeneration, lumbar region with discogenic back pain only: Secondary | ICD-10-CM | POA: Diagnosis not present

## 2023-05-30 DIAGNOSIS — M9905 Segmental and somatic dysfunction of pelvic region: Secondary | ICD-10-CM | POA: Diagnosis not present

## 2023-05-30 DIAGNOSIS — M9904 Segmental and somatic dysfunction of sacral region: Secondary | ICD-10-CM | POA: Diagnosis not present

## 2023-05-30 DIAGNOSIS — M9903 Segmental and somatic dysfunction of lumbar region: Secondary | ICD-10-CM | POA: Diagnosis not present

## 2023-06-03 DIAGNOSIS — M5136 Other intervertebral disc degeneration, lumbar region with discogenic back pain only: Secondary | ICD-10-CM | POA: Diagnosis not present

## 2023-06-03 DIAGNOSIS — M9905 Segmental and somatic dysfunction of pelvic region: Secondary | ICD-10-CM | POA: Diagnosis not present

## 2023-06-03 DIAGNOSIS — M9903 Segmental and somatic dysfunction of lumbar region: Secondary | ICD-10-CM | POA: Diagnosis not present

## 2023-06-03 DIAGNOSIS — M9904 Segmental and somatic dysfunction of sacral region: Secondary | ICD-10-CM | POA: Diagnosis not present

## 2023-06-05 DIAGNOSIS — M9905 Segmental and somatic dysfunction of pelvic region: Secondary | ICD-10-CM | POA: Diagnosis not present

## 2023-06-05 DIAGNOSIS — M5136 Other intervertebral disc degeneration, lumbar region with discogenic back pain only: Secondary | ICD-10-CM | POA: Diagnosis not present

## 2023-06-05 DIAGNOSIS — M9904 Segmental and somatic dysfunction of sacral region: Secondary | ICD-10-CM | POA: Diagnosis not present

## 2023-06-05 DIAGNOSIS — M9903 Segmental and somatic dysfunction of lumbar region: Secondary | ICD-10-CM | POA: Diagnosis not present

## 2023-06-10 DIAGNOSIS — M9903 Segmental and somatic dysfunction of lumbar region: Secondary | ICD-10-CM | POA: Diagnosis not present

## 2023-06-10 DIAGNOSIS — M9904 Segmental and somatic dysfunction of sacral region: Secondary | ICD-10-CM | POA: Diagnosis not present

## 2023-06-10 DIAGNOSIS — M9905 Segmental and somatic dysfunction of pelvic region: Secondary | ICD-10-CM | POA: Diagnosis not present

## 2023-06-10 DIAGNOSIS — M5136 Other intervertebral disc degeneration, lumbar region with discogenic back pain only: Secondary | ICD-10-CM | POA: Diagnosis not present

## 2023-06-11 DIAGNOSIS — M9905 Segmental and somatic dysfunction of pelvic region: Secondary | ICD-10-CM | POA: Diagnosis not present

## 2023-06-11 DIAGNOSIS — M9903 Segmental and somatic dysfunction of lumbar region: Secondary | ICD-10-CM | POA: Diagnosis not present

## 2023-06-11 DIAGNOSIS — M9904 Segmental and somatic dysfunction of sacral region: Secondary | ICD-10-CM | POA: Diagnosis not present

## 2023-06-11 DIAGNOSIS — M51361 Other intervertebral disc degeneration, lumbar region with lower extremity pain only: Secondary | ICD-10-CM | POA: Diagnosis not present

## 2023-06-12 DIAGNOSIS — M9903 Segmental and somatic dysfunction of lumbar region: Secondary | ICD-10-CM | POA: Diagnosis not present

## 2023-06-12 DIAGNOSIS — M9904 Segmental and somatic dysfunction of sacral region: Secondary | ICD-10-CM | POA: Diagnosis not present

## 2023-06-12 DIAGNOSIS — M9905 Segmental and somatic dysfunction of pelvic region: Secondary | ICD-10-CM | POA: Diagnosis not present

## 2023-06-12 DIAGNOSIS — M51361 Other intervertebral disc degeneration, lumbar region with lower extremity pain only: Secondary | ICD-10-CM | POA: Diagnosis not present

## 2023-06-14 ENCOUNTER — Ambulatory Visit: Payer: Medicare Other | Admitting: Cardiology

## 2023-06-20 ENCOUNTER — Encounter (HOSPITAL_BASED_OUTPATIENT_CLINIC_OR_DEPARTMENT_OTHER): Payer: Self-pay | Admitting: Cardiology

## 2023-06-20 ENCOUNTER — Ambulatory Visit (HOSPITAL_BASED_OUTPATIENT_CLINIC_OR_DEPARTMENT_OTHER): Payer: Medicare Other | Admitting: Cardiology

## 2023-06-20 VITALS — BP 112/56 | HR 75 | Ht 69.0 in | Wt 260.0 lb

## 2023-06-20 DIAGNOSIS — I77810 Thoracic aortic ectasia: Secondary | ICD-10-CM

## 2023-06-20 DIAGNOSIS — E782 Mixed hyperlipidemia: Secondary | ICD-10-CM

## 2023-06-20 DIAGNOSIS — I10 Essential (primary) hypertension: Secondary | ICD-10-CM

## 2023-06-20 DIAGNOSIS — N1832 Chronic kidney disease, stage 3b: Secondary | ICD-10-CM

## 2023-06-20 DIAGNOSIS — Z7189 Other specified counseling: Secondary | ICD-10-CM | POA: Diagnosis not present

## 2023-06-20 DIAGNOSIS — Z8249 Family history of ischemic heart disease and other diseases of the circulatory system: Secondary | ICD-10-CM

## 2023-06-20 NOTE — Progress Notes (Signed)
Cardiology Office Note:  .   Date:  06/20/2023  ID:  Joseph Compton, DOB [birth year], MRN [id number] PCP: Joseph Paradise, MD  [facility] Providers Cardiologist:  Jodelle Red, MD {  History of Present Illness: .   Joseph Compton is a 67 y.o. male with a hx of HTN, HLD, GERD and family history of CAD who presents to clinic for follow-up. He was previously seen by Dr. Shari Prows and established care with me on 06/20/23.   CV history: initially seen in 10/2020 for chest pressure/burning that was usually associated with meals. He was very active and had no symptoms during activity. Symptoms were deemed to unlikely be cardiac in nature. Ca score on 10/2021 was 0. His aorta was dilated at 45mm and follow-up CTA/MRA was recommended. MRA ordered due to baseline renal dysfunction which revealed mild dilation of ascending aorta at 4.3cm.   Today: Doing well overall. Notes that his blood pressure fluctuates, today is a low reading for him. He has been taking 1/2 pill of his olmesartan-hydrochlorothiazide.  Reviewed his aortic dilation and plans for management. Has occasional quick flutter in his upper left chest, not bothersome. Has some muscle aches on rosuvastatin, takes rosuvastatin 10 mg nightly. Has some LE edema at the end of the day, sometimes wears compression socks, notices some improvement when he wears this. Notes that he hasn't completed sleep study. Notes some nocturia, has difficulty falling back asleep once he gets up.  He follows with Dr. Malen [facility]auze at [facility].  Rides his recumbent bike 2-3 miles every day. Also goes to [employer].  ROS: Denies chest pain, shortness of breath at rest or with normal exertion. No PND, orthopnea, LE edema or unexpected weight gain. No syncope. ROS otherwise negative except as noted.   Studies Reviewed: Marland Kitchen    EKG:       Physical Exam:   VS:  BP (!) 112/56   Pulse 75   Ht 5\' 9"  (1.753 m)   Wt 260 lb (117.9 kg)   SpO2 95%   BMI 38.40  kg/m    Wt Readings from Last 3 Encounters:  06/20/23 260 lb (117.9 kg)  12/12/22 265 lb 8 oz (120.4 kg)  06/15/22 278 lb 12.8 oz (126.5 kg)    GEN: Well nourished, well developed in no acute distress HEENT: Normal, moist mucous membranes NECK: No JVD CARDIAC: regular rhythm, normal S1 and S2, no rubs or gallops. No murmur. VASCULAR: Radial and DP pulses 2+ bilaterally. No carotid bruits RESPIRATORY:  Clear to auscultation without rales, wheezing or rhonchi  ABDOMEN: Soft, non-tender, non-distended MUSCULOSKELETAL:  Ambulates independently SKIN: Warm and dry, bilateral mild LE edema with some venous stasis changes NEUROLOGIC:  Alert and oriented x 3. No focal neuro deficits noted. PSYCHIATRIC:  Normal affect    ASSESSMENT AND PLAN: .    HTN: -at goal -Continue metop succinate 25mg  XL daily -Continue olmesartan-hydrochlorothiazide, currently taking 1/2 pill, which equals olmesartan 10-hydrochlorothiazide 6.25. If BP remains low, with his CKD would consider stopping hydrochlorothiazide entirely and continuing on ARB alone   HLD: Family history of CAD: -has myalgia on rosuvastatin but currently feels that the dose of 10 mg is tolerable -has been on zetia in the past -Ca score 10/2021 0   Dilated Ascending Aorta: Measures 4.3cm on MRA in 12/2021, stable at 4.3 5/117/24 -Continue yearly surveillance with MRA, due 12/2023 -Continue ASA 81mg  daily   Suspected OSA: -we again discussed sleep study   CKD IIIB: -follows with nephrology, Dr. Malen Gauze  CV risk counseling and prevention -recommend heart healthy/Mediterranean diet, with whole grains, fruits, vegetable, fish, lean meats, nuts, and olive oil. Limit salt. -recommend moderate walking, 3-5 times/week for 30-50 minutes each session. Aim for at least 150 minutes.week. Goal should be pace of 3 miles/hours, or walking 1.5 miles in 30 minutes -recommend avoidance of tobacco products. Avoid excess alcohol.  Dispo: 6  mos  Signed, Jodelle Red, MD   Jodelle Red, MD, PhD, [facility]  [facility]    Heart & Vascular at [facility] at [facility] [address] [place], [place] [address] [phone]

## 2023-06-20 NOTE — Patient Instructions (Signed)
Medication Instructions:  Your physician recommends that you continue on your current medications as directed. Please refer to the Current Medication list given to you today.  *If you need a refill on your cardiac medications before your next appointment, please call your pharmacy*  Lab Work: NONE  Testing/Procedures: MRA OF CHEST IN MAY   Follow-Up: At [facility], you and your health needs are our priority.  As part of our continuing mission to provide you with exceptional heart care, we have created designated Provider Care Teams.  These Care Teams include your primary Cardiologist (physician) and Advanced Practice Providers (APPs -  Physician Assistants and Nurse Practitioners) who all work together to provide you with the care you need, when you need it.  We recommend signing up for the patient portal called "MyChart".  Sign up information is provided on this After Visit Summary.  MyChart is used to connect with patients for Virtual Visits (Telemedicine).  Patients are able to view lab/test results, encounter notes, upcoming appointments, etc.  Non-urgent messages can be sent to your provider as well.   To learn more about what you can do with MyChart, go to [email or web address].    Your next appointment:   After MRA of ches   The format for your next appointment:   In Person  Provider:   Jodelle Red, MD

## 2023-07-01 DIAGNOSIS — M9903 Segmental and somatic dysfunction of lumbar region: Secondary | ICD-10-CM | POA: Diagnosis not present

## 2023-07-01 DIAGNOSIS — M51361 Other intervertebral disc degeneration, lumbar region with lower extremity pain only: Secondary | ICD-10-CM | POA: Diagnosis not present

## 2023-07-01 DIAGNOSIS — M9904 Segmental and somatic dysfunction of sacral region: Secondary | ICD-10-CM | POA: Diagnosis not present

## 2023-07-01 DIAGNOSIS — M9905 Segmental and somatic dysfunction of pelvic region: Secondary | ICD-10-CM | POA: Diagnosis not present

## 2023-07-02 DIAGNOSIS — M9905 Segmental and somatic dysfunction of pelvic region: Secondary | ICD-10-CM | POA: Diagnosis not present

## 2023-07-02 DIAGNOSIS — M51361 Other intervertebral disc degeneration, lumbar region with lower extremity pain only: Secondary | ICD-10-CM | POA: Diagnosis not present

## 2023-07-02 DIAGNOSIS — M9903 Segmental and somatic dysfunction of lumbar region: Secondary | ICD-10-CM | POA: Diagnosis not present

## 2023-07-02 DIAGNOSIS — M9904 Segmental and somatic dysfunction of sacral region: Secondary | ICD-10-CM | POA: Diagnosis not present

## 2023-07-03 DIAGNOSIS — M9904 Segmental and somatic dysfunction of sacral region: Secondary | ICD-10-CM | POA: Diagnosis not present

## 2023-07-03 DIAGNOSIS — M9903 Segmental and somatic dysfunction of lumbar region: Secondary | ICD-10-CM | POA: Diagnosis not present

## 2023-07-03 DIAGNOSIS — M9905 Segmental and somatic dysfunction of pelvic region: Secondary | ICD-10-CM | POA: Diagnosis not present

## 2023-07-03 DIAGNOSIS — M51361 Other intervertebral disc degeneration, lumbar region with lower extremity pain only: Secondary | ICD-10-CM | POA: Diagnosis not present

## 2023-07-08 DIAGNOSIS — M9905 Segmental and somatic dysfunction of pelvic region: Secondary | ICD-10-CM | POA: Diagnosis not present

## 2023-07-08 DIAGNOSIS — M9903 Segmental and somatic dysfunction of lumbar region: Secondary | ICD-10-CM | POA: Diagnosis not present

## 2023-07-08 DIAGNOSIS — M51361 Other intervertebral disc degeneration, lumbar region with lower extremity pain only: Secondary | ICD-10-CM | POA: Diagnosis not present

## 2023-07-08 DIAGNOSIS — M9904 Segmental and somatic dysfunction of sacral region: Secondary | ICD-10-CM | POA: Diagnosis not present

## 2023-07-09 DIAGNOSIS — M9905 Segmental and somatic dysfunction of pelvic region: Secondary | ICD-10-CM | POA: Diagnosis not present

## 2023-07-09 DIAGNOSIS — M9903 Segmental and somatic dysfunction of lumbar region: Secondary | ICD-10-CM | POA: Diagnosis not present

## 2023-07-09 DIAGNOSIS — M51361 Other intervertebral disc degeneration, lumbar region with lower extremity pain only: Secondary | ICD-10-CM | POA: Diagnosis not present

## 2023-07-09 DIAGNOSIS — M9904 Segmental and somatic dysfunction of sacral region: Secondary | ICD-10-CM | POA: Diagnosis not present

## 2023-07-10 DIAGNOSIS — M9903 Segmental and somatic dysfunction of lumbar region: Secondary | ICD-10-CM | POA: Diagnosis not present

## 2023-07-10 DIAGNOSIS — M9905 Segmental and somatic dysfunction of pelvic region: Secondary | ICD-10-CM | POA: Diagnosis not present

## 2023-07-10 DIAGNOSIS — M51361 Other intervertebral disc degeneration, lumbar region with lower extremity pain only: Secondary | ICD-10-CM | POA: Diagnosis not present

## 2023-07-10 DIAGNOSIS — M9904 Segmental and somatic dysfunction of sacral region: Secondary | ICD-10-CM | POA: Diagnosis not present

## 2023-07-15 DIAGNOSIS — M51361 Other intervertebral disc degeneration, lumbar region with lower extremity pain only: Secondary | ICD-10-CM | POA: Diagnosis not present

## 2023-07-15 DIAGNOSIS — M9903 Segmental and somatic dysfunction of lumbar region: Secondary | ICD-10-CM | POA: Diagnosis not present

## 2023-07-15 DIAGNOSIS — M9905 Segmental and somatic dysfunction of pelvic region: Secondary | ICD-10-CM | POA: Diagnosis not present

## 2023-07-15 DIAGNOSIS — M9904 Segmental and somatic dysfunction of sacral region: Secondary | ICD-10-CM | POA: Diagnosis not present

## 2023-07-16 DIAGNOSIS — M51361 Other intervertebral disc degeneration, lumbar region with lower extremity pain only: Secondary | ICD-10-CM | POA: Diagnosis not present

## 2023-07-16 DIAGNOSIS — M9903 Segmental and somatic dysfunction of lumbar region: Secondary | ICD-10-CM | POA: Diagnosis not present

## 2023-07-16 DIAGNOSIS — M9904 Segmental and somatic dysfunction of sacral region: Secondary | ICD-10-CM | POA: Diagnosis not present

## 2023-07-16 DIAGNOSIS — M9905 Segmental and somatic dysfunction of pelvic region: Secondary | ICD-10-CM | POA: Diagnosis not present

## 2023-07-17 ENCOUNTER — Other Ambulatory Visit: Payer: Self-pay | Admitting: *Deleted

## 2023-07-17 MED ORDER — ROSUVASTATIN CALCIUM 10 MG PO TABS: 10.0000 mg | ORAL_TABLET | Freq: Every day | ORAL | 3 refills | Status: DC

## 2023-07-18 DIAGNOSIS — M9905 Segmental and somatic dysfunction of pelvic region: Secondary | ICD-10-CM | POA: Diagnosis not present

## 2023-07-18 DIAGNOSIS — M9904 Segmental and somatic dysfunction of sacral region: Secondary | ICD-10-CM | POA: Diagnosis not present

## 2023-07-18 DIAGNOSIS — M9903 Segmental and somatic dysfunction of lumbar region: Secondary | ICD-10-CM | POA: Diagnosis not present

## 2023-07-18 DIAGNOSIS — M51361 Other intervertebral disc degeneration, lumbar region with lower extremity pain only: Secondary | ICD-10-CM | POA: Diagnosis not present

## 2023-07-22 DIAGNOSIS — M9905 Segmental and somatic dysfunction of pelvic region: Secondary | ICD-10-CM | POA: Diagnosis not present

## 2023-07-22 DIAGNOSIS — M9904 Segmental and somatic dysfunction of sacral region: Secondary | ICD-10-CM | POA: Diagnosis not present

## 2023-07-22 DIAGNOSIS — M9903 Segmental and somatic dysfunction of lumbar region: Secondary | ICD-10-CM | POA: Diagnosis not present

## 2023-07-22 DIAGNOSIS — M51361 Other intervertebral disc degeneration, lumbar region with lower extremity pain only: Secondary | ICD-10-CM | POA: Diagnosis not present

## 2023-07-23 DIAGNOSIS — M9904 Segmental and somatic dysfunction of sacral region: Secondary | ICD-10-CM | POA: Diagnosis not present

## 2023-07-23 DIAGNOSIS — M9905 Segmental and somatic dysfunction of pelvic region: Secondary | ICD-10-CM | POA: Diagnosis not present

## 2023-07-23 DIAGNOSIS — M51361 Other intervertebral disc degeneration, lumbar region with lower extremity pain only: Secondary | ICD-10-CM | POA: Diagnosis not present

## 2023-07-23 DIAGNOSIS — N1832 Chronic kidney disease, stage 3b: Secondary | ICD-10-CM | POA: Diagnosis not present

## 2023-07-23 DIAGNOSIS — M9903 Segmental and somatic dysfunction of lumbar region: Secondary | ICD-10-CM | POA: Diagnosis not present

## 2023-07-24 DIAGNOSIS — M9904 Segmental and somatic dysfunction of sacral region: Secondary | ICD-10-CM | POA: Diagnosis not present

## 2023-07-24 DIAGNOSIS — M51361 Other intervertebral disc degeneration, lumbar region with lower extremity pain only: Secondary | ICD-10-CM | POA: Diagnosis not present

## 2023-07-24 DIAGNOSIS — M9903 Segmental and somatic dysfunction of lumbar region: Secondary | ICD-10-CM | POA: Diagnosis not present

## 2023-07-24 DIAGNOSIS — M9905 Segmental and somatic dysfunction of pelvic region: Secondary | ICD-10-CM | POA: Diagnosis not present

## 2023-07-25 DIAGNOSIS — M9905 Segmental and somatic dysfunction of pelvic region: Secondary | ICD-10-CM | POA: Diagnosis not present

## 2023-07-25 DIAGNOSIS — M9904 Segmental and somatic dysfunction of sacral region: Secondary | ICD-10-CM | POA: Diagnosis not present

## 2023-07-25 DIAGNOSIS — M9903 Segmental and somatic dysfunction of lumbar region: Secondary | ICD-10-CM | POA: Diagnosis not present

## 2023-07-25 DIAGNOSIS — M51361 Other intervertebral disc degeneration, lumbar region with lower extremity pain only: Secondary | ICD-10-CM | POA: Diagnosis not present

## 2023-07-29 DIAGNOSIS — M9905 Segmental and somatic dysfunction of pelvic region: Secondary | ICD-10-CM | POA: Diagnosis not present

## 2023-07-29 DIAGNOSIS — M9904 Segmental and somatic dysfunction of sacral region: Secondary | ICD-10-CM | POA: Diagnosis not present

## 2023-07-29 DIAGNOSIS — M9903 Segmental and somatic dysfunction of lumbar region: Secondary | ICD-10-CM | POA: Diagnosis not present

## 2023-07-29 DIAGNOSIS — M51361 Other intervertebral disc degeneration, lumbar region with lower extremity pain only: Secondary | ICD-10-CM | POA: Diagnosis not present

## 2023-07-30 DIAGNOSIS — M9905 Segmental and somatic dysfunction of pelvic region: Secondary | ICD-10-CM | POA: Diagnosis not present

## 2023-07-30 DIAGNOSIS — M9903 Segmental and somatic dysfunction of lumbar region: Secondary | ICD-10-CM | POA: Diagnosis not present

## 2023-07-30 DIAGNOSIS — M9904 Segmental and somatic dysfunction of sacral region: Secondary | ICD-10-CM | POA: Diagnosis not present

## 2023-07-30 DIAGNOSIS — M51361 Other intervertebral disc degeneration, lumbar region with lower extremity pain only: Secondary | ICD-10-CM | POA: Diagnosis not present

## 2023-07-31 DIAGNOSIS — E875 Hyperkalemia: Secondary | ICD-10-CM | POA: Diagnosis not present

## 2023-07-31 DIAGNOSIS — M9903 Segmental and somatic dysfunction of lumbar region: Secondary | ICD-10-CM | POA: Diagnosis not present

## 2023-07-31 DIAGNOSIS — M9904 Segmental and somatic dysfunction of sacral region: Secondary | ICD-10-CM | POA: Diagnosis not present

## 2023-07-31 DIAGNOSIS — I714 Abdominal aortic aneurysm, without rupture, unspecified: Secondary | ICD-10-CM | POA: Diagnosis not present

## 2023-07-31 DIAGNOSIS — M9905 Segmental and somatic dysfunction of pelvic region: Secondary | ICD-10-CM | POA: Diagnosis not present

## 2023-07-31 DIAGNOSIS — N1832 Chronic kidney disease, stage 3b: Secondary | ICD-10-CM | POA: Diagnosis not present

## 2023-07-31 DIAGNOSIS — M51361 Other intervertebral disc degeneration, lumbar region with lower extremity pain only: Secondary | ICD-10-CM | POA: Diagnosis not present

## 2023-07-31 DIAGNOSIS — I129 Hypertensive chronic kidney disease with stage 1 through stage 4 chronic kidney disease, or unspecified chronic kidney disease: Secondary | ICD-10-CM | POA: Diagnosis not present

## 2023-08-13 ENCOUNTER — Ambulatory Visit
Admission: EM | Admit: 2023-08-13 | Discharge: 2023-08-13 | Disposition: A | Discharge status: Home / Self Care | Payer: Medicare Other | Attending: Family Medicine | Admitting: Family Medicine

## 2023-08-13 ENCOUNTER — Encounter: Payer: Self-pay | Admitting: Emergency Medicine

## 2023-08-13 DIAGNOSIS — J020 Streptococcal pharyngitis: Secondary | ICD-10-CM | POA: Diagnosis not present

## 2023-08-13 DIAGNOSIS — J040 Acute laryngitis: Secondary | ICD-10-CM | POA: Diagnosis not present

## 2023-08-13 DIAGNOSIS — R0982 Postnasal drip: Secondary | ICD-10-CM

## 2023-08-13 DIAGNOSIS — R070 Pain in throat: Secondary | ICD-10-CM | POA: Diagnosis not present

## 2023-08-13 LAB — POCT RAPID STREP A (OFFICE): Rapid Strep A Screen: POSITIVE — AB

## 2023-08-13 MED ORDER — AMOXICILLIN 500 MG PO CAPS: 500.0000 mg | ORAL_CAPSULE | Freq: Two times a day (BID) | ORAL | 0 refills | Status: AC

## 2023-08-13 MED ORDER — PREDNISONE 20 MG PO TABS: ORAL_TABLET | ORAL | 0 refills | Status: DC

## 2023-08-13 NOTE — Discharge Instructions (Addendum)
 Please take the amoxicillin entirely for your strep throat infection. Use prednisone for your throat pain and inflammation, laryngitis. You can also just use Tylenol.

## 2023-08-13 NOTE — ED Provider Notes (Signed)
 [facility]  Note:  This document was prepared using Conservation officer, historic buildings and may include unintentional dictation errors.  MRN: [id number] DOB: Dec 29, 1955  Subjective:   Joseph Compton is a 67 y.o. male presenting for 3-day history of acute onset throat pain, painful swallowing, sinus congestion, coughing, hoarseness.  Patient would like a strep test.  No current facility-administered medications for this encounter.  Current Outpatient Medications:    Acetaminophen (TYLENOL) 325 MG CAPS, as needed., Disp: , Rfl:    Apple Cider Vinegar 188 MG CAPS, Take by mouth., Disp: , Rfl:    aspirin 81 MG chewable tablet, , Disp: , Rfl:    metoprolol  succinate (TOPROL  XL) 25 MG 24 hr tablet, Take 1 tablet (25 mg total) by mouth daily. Please keep scheduled appointment for future refills. Thank you., Disp: 90 tablet, Rfl: 3   olmesartan-hydrochlorothiazide (BENICAR HCT) 20-12.5 MG tablet, Take 1 tablet by mouth daily., Disp: , Rfl:    omeprazole (PRILOSEC) 40 MG capsule, 1 CAP 30MINS BEFORE BREAKFAST DAILY, Disp: , Rfl:    rosuvastatin  (CRESTOR ) 10 MG tablet, Take 1 tablet (10 mg total) by mouth daily., Disp: 90 tablet, Rfl: 3   traZODone (DESYREL) 50 MG tablet, Take 1/2-1 tablets at bedtime as needed Orally Once a day for 30 days, Disp: , Rfl:    Allergies  Allergen Reactions   Other Other (See Comments)    Kidney issues    Past Medical History:  Diagnosis Date   Abdominal aortic aneurysm (AAA) 3.0 cm to 5.5 cm in diameter in male (HCC) 03/07/2022   PER PATIENT   Arthritis    Chest pain on exertion    Chronic kidney disease, stage 3a (HCC)    GERD (gastroesophageal reflux disease)    History of Helicobacter pylori infection    in [place]    History of transient ischemic attack (TIA)    per patient   Hyperlipemia    Hypertension    Hypertensive kidney disease with CKD (chronic kidney disease) stage I    Impaired fasting glucose    Obesity     Radiculopathy, lumbar region      Past Surgical History:  Procedure Laterality Date   MOUTH SURGERY  2000    Family History  Problem Relation Age of Onset   Diabetes Mother    Hypertension Mother    Hyperlipidemia Mother    Heart Problems Mother    Heart failure Mother    Hypothyroidism Mother    Colon polyps Father    Hyperlipidemia Father    Heart Problems Father    Angina Father    Asthma Father    Other Father        stomach ulcer   Heart failure Father    Colon cancer Neg Hx    Esophageal cancer Neg Hx    Stomach cancer Neg Hx    Rectal cancer Neg Hx     Social History   Tobacco Use   Smoking status: Never   Smokeless tobacco: Never  Vaping Use   Vaping status: Never Used  Substance Use Topics   Alcohol  use: Never    Comment: rarely   Drug use: Never    ROS   Objective:   Vitals: BP (!) 144/84 (BP Location: Right Arm)   Pulse 65   Temp 97.9 F (36.6 C) (Oral)   Resp 16   Ht 5' 9 (1.753 m)   Wt 250 lb (113.4 kg)   SpO2 97%  BMI 36.92 kg/m   Physical Exam Constitutional:      General: He is not in acute distress.    Appearance: Normal appearance. He is well-developed and normal weight. He is not ill-appearing, toxic-appearing or diaphoretic.  HENT:     Head: Normocephalic and atraumatic.     Right Ear: External ear normal.     Left Ear: External ear normal.     Nose: Congestion present. No rhinorrhea.     Mouth/Throat:     Mouth: Mucous membranes are moist.     Pharynx: Posterior oropharyngeal erythema present. No pharyngeal swelling, oropharyngeal exudate or uvula swelling.     Tonsils: No tonsillar exudate or tonsillar abscesses. 0 on the right. 0 on the left.  Eyes:     General: No scleral icterus.       Right eye: No discharge.        Left eye: No discharge.     Extraocular Movements: Extraocular movements intact.  Cardiovascular:     Rate and Rhythm: Normal rate and regular rhythm.     Heart sounds: Normal heart sounds. No  murmur heard.    No friction rub. No gallop.  Pulmonary:     Effort: Pulmonary effort is normal. No respiratory distress.     Breath sounds: Normal breath sounds. No stridor. No wheezing, rhonchi or rales.  Musculoskeletal:     Cervical back: Normal range of motion.  Neurological:     Mental Status: He is alert and oriented to person, place, and time.  Psychiatric:        Mood and Affect: Mood normal.        Behavior: Behavior normal.        Thought Content: Thought content normal.        Judgment: Judgment normal.     Results for orders placed or performed during the hospital encounter of 08/13/23 (from the past 24 hours)  POCT rapid strep A     Status: Abnormal   Collection Time: 08/13/23  3:01 PM  Result Value Ref Range   Rapid Strep A Screen Positive (A) Negative    Assessment and Plan :   PDMP not reviewed this encounter.  1. Strep pharyngitis   2. Laryngitis   3. Throat pain   4. Post-nasal drainage    CrCl 40-52 mL/min and therefore recommended amoxicillin  at 500 mg for 10 days to address the strep pharyngitis.  Offered prednisone  to help patient with laryngitis, postnasal drainage as well.  Counseled patient on potential for adverse effects with medications prescribed/recommended today, ER and return-to-clinic precautions discussed, patient verbalized understanding.    Christopher Savannah, [place] 08/13/23 [id number]

## 2023-08-13 NOTE — ED Triage Notes (Signed)
Patient c/o sore throat, congestion, cough, decreased sleep x 3 days.  Throat is extremely painful.  Patient has taken Nyquil and Tylenol.

## 2023-10-22 ENCOUNTER — Other Ambulatory Visit: Payer: Self-pay

## 2023-10-22 DIAGNOSIS — I779 Disorder of arteries and arterioles, unspecified: Secondary | ICD-10-CM

## 2023-10-22 DIAGNOSIS — R0683 Snoring: Secondary | ICD-10-CM

## 2023-10-22 DIAGNOSIS — G4733 Obstructive sleep apnea (adult) (pediatric): Secondary | ICD-10-CM

## 2023-10-22 MED ORDER — METOPROLOL SUCCINATE ER 25 MG PO TB24: 25.0000 mg | ORAL_TABLET | Freq: Every day | ORAL | 2 refills | Status: DC

## 2023-11-20 ENCOUNTER — Encounter (HOSPITAL_BASED_OUTPATIENT_CLINIC_OR_DEPARTMENT_OTHER): Payer: Self-pay

## 2023-11-27 DIAGNOSIS — I129 Hypertensive chronic kidney disease with stage 1 through stage 4 chronic kidney disease, or unspecified chronic kidney disease: Secondary | ICD-10-CM | POA: Diagnosis not present

## 2023-11-27 DIAGNOSIS — N1831 Chronic kidney disease, stage 3a: Secondary | ICD-10-CM | POA: Diagnosis not present

## 2023-11-27 DIAGNOSIS — R7301 Impaired fasting glucose: Secondary | ICD-10-CM | POA: Diagnosis not present

## 2023-11-27 DIAGNOSIS — Z125 Encounter for screening for malignant neoplasm of prostate: Secondary | ICD-10-CM | POA: Diagnosis not present

## 2023-11-27 DIAGNOSIS — E785 Hyperlipidemia, unspecified: Secondary | ICD-10-CM | POA: Diagnosis not present

## 2023-12-02 DIAGNOSIS — N1831 Chronic kidney disease, stage 3a: Secondary | ICD-10-CM | POA: Diagnosis not present

## 2023-12-02 DIAGNOSIS — R7301 Impaired fasting glucose: Secondary | ICD-10-CM | POA: Diagnosis not present

## 2023-12-02 DIAGNOSIS — E669 Obesity, unspecified: Secondary | ICD-10-CM | POA: Diagnosis not present

## 2023-12-02 DIAGNOSIS — I129 Hypertensive chronic kidney disease with stage 1 through stage 4 chronic kidney disease, or unspecified chronic kidney disease: Secondary | ICD-10-CM | POA: Diagnosis not present

## 2023-12-02 DIAGNOSIS — Z0000 Encounter for general adult medical examination without abnormal findings: Secondary | ICD-10-CM | POA: Diagnosis not present

## 2023-12-02 DIAGNOSIS — I10 Essential (primary) hypertension: Secondary | ICD-10-CM | POA: Diagnosis not present

## 2023-12-02 DIAGNOSIS — E785 Hyperlipidemia, unspecified: Secondary | ICD-10-CM | POA: Diagnosis not present

## 2023-12-02 DIAGNOSIS — K219 Gastro-esophageal reflux disease without esophagitis: Secondary | ICD-10-CM | POA: Diagnosis not present

## 2023-12-02 DIAGNOSIS — R82998 Other abnormal findings in urine: Secondary | ICD-10-CM | POA: Diagnosis not present

## 2023-12-10 DIAGNOSIS — E875 Hyperkalemia: Secondary | ICD-10-CM | POA: Diagnosis not present

## 2023-12-10 DIAGNOSIS — N1832 Chronic kidney disease, stage 3b: Secondary | ICD-10-CM | POA: Diagnosis not present

## 2023-12-10 DIAGNOSIS — I129 Hypertensive chronic kidney disease with stage 1 through stage 4 chronic kidney disease, or unspecified chronic kidney disease: Secondary | ICD-10-CM | POA: Diagnosis not present

## 2023-12-10 DIAGNOSIS — I714 Abdominal aortic aneurysm, without rupture, unspecified: Secondary | ICD-10-CM | POA: Diagnosis not present

## 2023-12-20 ENCOUNTER — Ambulatory Visit (HOSPITAL_BASED_OUTPATIENT_CLINIC_OR_DEPARTMENT_OTHER)
Admission: RE | Admit: 2023-12-20 | Discharge: 2023-12-20 | Disposition: A | Discharge status: Home / Self Care | Source: Ambulatory Visit | Attending: Cardiology | Admitting: Cardiology

## 2023-12-20 DIAGNOSIS — I517 Cardiomegaly: Secondary | ICD-10-CM | POA: Diagnosis not present

## 2023-12-20 DIAGNOSIS — I7121 Aneurysm of the ascending aorta, without rupture: Secondary | ICD-10-CM | POA: Diagnosis not present

## 2023-12-20 DIAGNOSIS — I77810 Thoracic aortic ectasia: Secondary | ICD-10-CM | POA: Insufficient documentation

## 2023-12-24 ENCOUNTER — Other Ambulatory Visit (HOSPITAL_BASED_OUTPATIENT_CLINIC_OR_DEPARTMENT_OTHER): Payer: Self-pay

## 2023-12-24 ENCOUNTER — Encounter (HOSPITAL_BASED_OUTPATIENT_CLINIC_OR_DEPARTMENT_OTHER): Payer: Self-pay

## 2023-12-24 ENCOUNTER — Ambulatory Visit (HOSPITAL_BASED_OUTPATIENT_CLINIC_OR_DEPARTMENT_OTHER): Payer: Self-pay

## 2023-12-24 DIAGNOSIS — I77810 Thoracic aortic ectasia: Secondary | ICD-10-CM

## 2023-12-24 DIAGNOSIS — H1045 Other chronic allergic conjunctivitis: Secondary | ICD-10-CM | POA: Diagnosis not present

## 2023-12-24 DIAGNOSIS — H2513 Age-related nuclear cataract, bilateral: Secondary | ICD-10-CM | POA: Diagnosis not present

## 2023-12-24 DIAGNOSIS — H43813 Vitreous degeneration, bilateral: Secondary | ICD-10-CM | POA: Diagnosis not present

## 2024-02-26 DIAGNOSIS — I129 Hypertensive chronic kidney disease with stage 1 through stage 4 chronic kidney disease, or unspecified chronic kidney disease: Secondary | ICD-10-CM | POA: Diagnosis not present

## 2024-02-26 DIAGNOSIS — I714 Abdominal aortic aneurysm, without rupture, unspecified: Secondary | ICD-10-CM | POA: Diagnosis not present

## 2024-02-26 DIAGNOSIS — R7301 Impaired fasting glucose: Secondary | ICD-10-CM | POA: Diagnosis not present

## 2024-02-26 DIAGNOSIS — G4730 Sleep apnea, unspecified: Secondary | ICD-10-CM | POA: Diagnosis not present

## 2024-02-26 DIAGNOSIS — N1831 Chronic kidney disease, stage 3a: Secondary | ICD-10-CM | POA: Diagnosis not present

## 2024-02-26 DIAGNOSIS — F5101 Primary insomnia: Secondary | ICD-10-CM | POA: Diagnosis not present

## 2024-03-13 ENCOUNTER — Ambulatory Visit
Admission: EM | Admit: 2024-03-13 | Discharge: 2024-03-13 | Disposition: A | Discharge status: Home / Self Care | Attending: Family Medicine | Admitting: Family Medicine

## 2024-03-13 DIAGNOSIS — F4321 Adjustment disorder with depressed mood: Secondary | ICD-10-CM | POA: Diagnosis not present

## 2024-03-13 DIAGNOSIS — R55 Syncope and collapse: Secondary | ICD-10-CM

## 2024-03-13 DIAGNOSIS — H538 Other visual disturbances: Secondary | ICD-10-CM | POA: Diagnosis not present

## 2024-03-13 NOTE — ED Triage Notes (Signed)
 Pt reports blurry vision, lightheadedness  started this morning. Pt think can be related to the blood pressure medication (taking for 3 years, same dose). Pt started a protein diet, low carbs 2 weeks ago.,

## 2024-03-13 NOTE — ED Provider Notes (Signed)
 [facility]  Note:  This document was prepared using Conservation officer, historic buildings and may include unintentional dictation errors.  MRN: [id number] DOB: [birth year]  Subjective:   Joseph Compton is a 67 y.o. male presenting for an episode of lightheadedness, unsteadiness, blurred vision. Felt like he could pass out. 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 Felt woozy, was making some mistakes on his paper work, felt like his vision was going dark, felt a dry mouth. Symptoms lasted a few minutes. Maintained most of his functions but felt worried and therefore left work. No confusion, slurred speech, loss of consciousness, headache, chest pain, heart racing, palpitations. Has been doing high protein, low carb diet for weight loss.  However, his wife reports that he has not been eating as consistently the past 1 to 2 weeks.  Has been compliant with his bp medications, has been on them for ~4-5 years. Has been using a new sleep medication, Ambien, sparingly as prescribed by his PCP. Has undergone some recent significant stressors including the recent loss of family members.  He did have a protein drink this morning, hard boiled egg. He did exercise this morning but admits that he did not hydrate very much at all.  Also reports he did not sleep well last night.  Has a remote history of a TIA.  Also has a history of an abdominal aortic aneurysm.  No history of stroke or cerebrovascular disease.  No current facility-administered medications for this encounter.  Current Outpatient Medications:    Acetaminophen (TYLENOL) 325 MG CAPS, as needed., Disp: , Rfl:    amoxicillin  (AMOXIL ) 500 MG capsule, Take 1 capsule (500 mg total) by mouth 2 (two) times daily., Disp: 20 capsule, Rfl: 0   Apple Cider Vinegar 188 MG CAPS, Take by mouth., Disp: , Rfl:    aspirin 81 MG chewable tablet, , Disp: , Rfl:    metoprolol  succinate (TOPROL  XL) 25 MG 24  hr tablet, Take 1 tablet (25 mg total) by mouth daily., Disp: 90 tablet, Rfl: 2   olmesartan-hydrochlorothiazide (BENICAR HCT) 20-12.5 MG tablet, Take 1 tablet by mouth daily., Disp: , Rfl:    omeprazole (PRILOSEC) 40 MG capsule, 1 CAP 30MINS BEFORE BREAKFAST DAILY, Disp: , Rfl:    rosuvastatin  (CRESTOR ) 10 MG tablet, Take 1 tablet (10 mg total) by mouth daily., Disp: 90 tablet, Rfl: 3   traZODone (DESYREL) 50 MG tablet, Take 1/2-1 tablets at bedtime as needed Orally Once a day for 30 days, Disp: , Rfl:    Allergies  Allergen Reactions   Nsaids Other (See Comments)    Non-steroidal anti-inflammatory agent (product)   Other Other (See Comments)    Kidney issues    Past Medical History:  Diagnosis Date   Abdominal aortic aneurysm (AAA) 3.0 cm to 5.5 cm in diameter in male (HCC) 03/07/2022   PER PATIENT   Arthritis    Chest pain on exertion    Chronic kidney disease, stage 3a (HCC)    GERD (gastroesophageal reflux disease)    History of Helicobacter pylori infection    in [place]    History of transient ischemic attack (TIA)    per patient   Hyperlipemia    Hypertension    Hypertensive kidney disease with CKD (chronic kidney disease) stage I    Impaired fasting glucose    Obesity    Radiculopathy, lumbar region      Past Surgical History:  Procedure Laterality Date  MOUTH SURGERY  2000    Family History  Problem Relation Age of Onset   Diabetes Mother    Hypertension Mother    Hyperlipidemia Mother    Heart Problems Mother    Heart failure Mother    Hypothyroidism Mother    Colon polyps Father    Hyperlipidemia Father    Heart Problems Father    Angina Father    Asthma Father    Other Father        stomach ulcer   Heart failure Father    Colon cancer Neg Hx    Esophageal cancer Neg Hx    Stomach cancer Neg Hx    Rectal cancer Neg Hx     Social History   Tobacco Use   Smoking status: Never   Smokeless tobacco: Never  Vaping Use   Vaping status: Never  Used  Substance Use Topics   Alcohol  use: Never    Comment: rarely   Drug use: Never    ROS   Objective:   Vitals: BP 127/85 (BP Location: Left Arm)   Pulse 70   Temp 97.7 F (36.5 C) (Oral)   Resp 18   SpO2 97%   BP Readings from Last 3 Encounters:  03/13/24 127/85  08/13/23 (!) 144/84  06/20/23 (!) 112/56    Physical Exam Constitutional:      General: He is not in acute distress.    Appearance: Normal appearance. He is well-developed and normal weight. He is not ill-appearing, toxic-appearing or diaphoretic.  HENT:     Head: Normocephalic and atraumatic.     Right Ear: External ear normal.     Left Ear: External ear normal.     Nose: Nose normal.     Mouth/Throat:     Mouth: Mucous membranes are moist.  Eyes:     General: Lids are everted, no foreign bodies appreciated. No scleral icterus.       Right eye: No foreign body, discharge or hordeolum.        Left eye: No foreign body, discharge or hordeolum.     Extraocular Movements: Extraocular movements intact.     Conjunctiva/sclera: Conjunctivae normal.     Right eye: Right conjunctiva is not injected. No chemosis, exudate or hemorrhage.    Left eye: Left conjunctiva is not injected. No chemosis, exudate or hemorrhage.    Pupils: Pupils are equal, round, and reactive to light.  Neck:     Meningeal: Brudzinski's sign and Kernig's sign absent.  Cardiovascular:     Rate and Rhythm: Normal rate and regular rhythm.     Heart sounds: Normal heart sounds. No murmur heard.    No friction rub. No gallop.  Pulmonary:     Effort: Pulmonary effort is normal. No respiratory distress.     Breath sounds: Normal breath sounds. No stridor. No wheezing, rhonchi or rales.  Musculoskeletal:     Cervical back: Normal range of motion.  Neurological:     Mental Status: He is alert and oriented to person, place, and time.     Cranial Nerves: No cranial nerve deficit, dysarthria or facial asymmetry.     Motor: No weakness,  abnormal muscle tone or pronator drift.     Coordination: Romberg sign negative. Coordination normal. Finger-Nose-Finger Test and Heel to [facility] Test normal. Rapid alternating movements normal.     Gait: Gait normal.     Deep Tendon Reflexes: Reflexes normal.     Comments: No facial asymmetry.  Negative Romberg and  pronator drift.  Psychiatric:        Mood and Affect: Mood normal.        Behavior: Behavior normal.        Thought Content: Thought content normal.        Judgment: Judgment normal.     Assessment and Plan :   PDMP not reviewed this encounter.  1. Near syncope   2. Blurred vision   3. Grieving    Offered point-of-care testing, basic labs but patient declined.  Suspect accumulative effect of her lack of sleep, lack of hydration, lack of nutrition this morning after exercise.  Patient has a reassuring neurologic and cardiopulmonary exam.  Reviewed strict ER precautions.  Emphasized need for consistent health maintenance.  Recommended caution with the use of Ambien.  Counseled patient on potential for adverse effects with medications prescribed/recommended today, ER and return-to-clinic precautions discussed, patient verbalized understanding.    Christopher Savannah, [place] 03/13/24 1438

## 2024-03-23 DIAGNOSIS — G4730 Sleep apnea, unspecified: Secondary | ICD-10-CM | POA: Diagnosis not present

## 2024-04-01 DIAGNOSIS — N1832 Chronic kidney disease, stage 3b: Secondary | ICD-10-CM | POA: Diagnosis not present

## 2024-04-08 DIAGNOSIS — I129 Hypertensive chronic kidney disease with stage 1 through stage 4 chronic kidney disease, or unspecified chronic kidney disease: Secondary | ICD-10-CM | POA: Diagnosis not present

## 2024-04-08 DIAGNOSIS — I714 Abdominal aortic aneurysm, without rupture, unspecified: Secondary | ICD-10-CM | POA: Diagnosis not present

## 2024-04-08 DIAGNOSIS — N179 Acute kidney failure, unspecified: Secondary | ICD-10-CM | POA: Diagnosis not present

## 2024-04-08 DIAGNOSIS — E875 Hyperkalemia: Secondary | ICD-10-CM | POA: Diagnosis not present

## 2024-04-08 DIAGNOSIS — N1832 Chronic kidney disease, stage 3b: Secondary | ICD-10-CM | POA: Diagnosis not present

## 2024-04-17 DIAGNOSIS — N1831 Chronic kidney disease, stage 3a: Secondary | ICD-10-CM | POA: Diagnosis not present

## 2024-04-17 DIAGNOSIS — M5412 Radiculopathy, cervical region: Secondary | ICD-10-CM | POA: Diagnosis not present

## 2024-04-17 DIAGNOSIS — M79602 Pain in left arm: Secondary | ICD-10-CM | POA: Diagnosis not present

## 2024-04-17 DIAGNOSIS — I1310 Hypertensive heart and chronic kidney disease without heart failure, with stage 1 through stage 4 chronic kidney disease, or unspecified chronic kidney disease: Secondary | ICD-10-CM | POA: Diagnosis not present

## 2024-04-17 DIAGNOSIS — F5101 Primary insomnia: Secondary | ICD-10-CM | POA: Diagnosis not present

## 2024-04-17 DIAGNOSIS — G4730 Sleep apnea, unspecified: Secondary | ICD-10-CM | POA: Diagnosis not present

## 2024-04-17 DIAGNOSIS — I714 Abdominal aortic aneurysm, without rupture, unspecified: Secondary | ICD-10-CM | POA: Diagnosis not present

## 2024-04-17 DIAGNOSIS — N179 Acute kidney failure, unspecified: Secondary | ICD-10-CM | POA: Diagnosis not present

## 2024-04-17 DIAGNOSIS — R42 Dizziness and giddiness: Secondary | ICD-10-CM | POA: Diagnosis not present

## 2024-04-17 DIAGNOSIS — I129 Hypertensive chronic kidney disease with stage 1 through stage 4 chronic kidney disease, or unspecified chronic kidney disease: Secondary | ICD-10-CM | POA: Diagnosis not present

## 2024-04-17 DIAGNOSIS — H8113 Benign paroxysmal vertigo, bilateral: Secondary | ICD-10-CM | POA: Diagnosis not present

## 2024-05-11 ENCOUNTER — Encounter: Payer: Self-pay | Admitting: Physical Therapy

## 2024-05-11 ENCOUNTER — Other Ambulatory Visit: Payer: Self-pay

## 2024-05-11 ENCOUNTER — Telehealth: Payer: Self-pay | Admitting: Cardiology

## 2024-05-11 ENCOUNTER — Ambulatory Visit: Attending: Registered Nurse | Admitting: Physical Therapy

## 2024-05-11 DIAGNOSIS — R2681 Unsteadiness on feet: Secondary | ICD-10-CM | POA: Insufficient documentation

## 2024-05-11 DIAGNOSIS — R42 Dizziness and giddiness: Secondary | ICD-10-CM | POA: Diagnosis not present

## 2024-05-11 NOTE — Telephone Encounter (Signed)
 Spoke with patient regarding blood pressure and heart rate  Patient concerned with low heart rate At night HR running in the 40's and has even gotten an alert for it being as low as 40  Sometimes he is just lying in bed, not sleeping Last few days when doing his exercise bike HR only 104-110 with same amount of exercise has been doing  Has lost quite a bit of weight Does have some dizziness but has vertigo and vestibular rehab  Takes Toprol  25 mg daily   Will forward to Dr Lonni for review

## 2024-05-11 NOTE — Telephone Encounter (Signed)
 Pt c/o BP issue: STAT if pt c/o blurred vision, one-sided weakness or slurred speech.  STAT if BP is GREATER than 180/120 TODAY.  STAT if BP is LESS than 90/60 and SYMPTOMATIC TODAY  1. What is your BP concern? Low   2. Have you taken any BP medication today? yes  3. What are your last 5 BP readings? 122/77  4. Are you having any other symptoms (ex. Dizziness, headache, blurred vision, passed out)? Some dizziness

## 2024-05-11 NOTE — Therapy (Signed)
 OUTPATIENT PHYSICAL THERAPY VESTIBULAR EVALUATION     Patient Name: Joseph Compton MRN: [id number] DOB:March 23, 1956, 68 y.o., male Today's Date: 05/12/2024  END OF SESSION:  PT End of Session - 05/12/24 0731     Visit Number 1    Number of Visits 13    Date for Recertification  06/26/24    Authorization Type Medicare/BCBS    PT Start Time 1535    PT Stop Time 1617    PT Time Calculation (min) 42 min    Activity Tolerance Patient tolerated treatment well    Behavior During Therapy [facility] for tasks assessed/performed          Past Medical History:  Diagnosis Date   Abdominal aortic aneurysm (AAA) 3.0 cm to 5.5 cm in diameter in male 03/07/2022   PER PATIENT   Arthritis    Chest pain on exertion    Chronic kidney disease, stage 3a (HCC)    GERD (gastroesophageal reflux disease)    History of Helicobacter pylori infection    in [place]    History of transient ischemic attack (TIA)    per patient   Hyperlipemia    Hypertension    Hypertensive kidney disease with CKD (chronic kidney disease) stage I    Impaired fasting glucose    Obesity    Radiculopathy, lumbar region    Past Surgical History:  Procedure Laterality Date   MOUTH SURGERY  2000   Patient Active Problem List   Diagnosis Date Noted   Dyslipidemia, goal LDL below 70 07/27/2022   Lumbar radiculopathy 04/05/2021   Pain in left foot 09/17/2019   Degeneration of lumbar intervertebral disc 11/17/2018   Epidural lipomatosis 11/17/2018   Lumbar disc herniation with radiculopathy 11/06/2018    PCP: Shepard Ade, MD  REFERRING PROVIDER: Verta Izetta HERO, NP   REFERRING DIAG:  [phone] (ICD-10-CM) - Benign paroxysmal vertigo, bilateral  R42 (ICD-10-CM) - Dizziness and giddiness  M79.602 (ICD-10-CM) - Pain in left arm  M54.12 (ICD-10-CM) - Radiculopathy, cervical region    THERAPY DIAG:  Dizziness and giddiness  Unsteadiness on feet  ONSET DATE: 04/27/2024  Rationale for Evaluation and Treatment:  Rehabilitation  SUBJECTIVE:   SUBJECTIVE STATEMENT: Every once in a while, I will get dizzy; not sure if it is neck or medication related.  Sometimes a hissing noise of fullness in my ear.  If I walk downstairs or at an angle, feels like I will fall over.  Every once in a while, get spinning. Off and on, been going on for a little while 6 months.  Have had it in the past, but it went away on its own. Pt accompanied by: self  PERTINENT HISTORY: arthritis R Knee> L knee, lumbar radiculopathy, lumbar disc degeneration  PAIN:  Are you having pain? Yes: NPRS scale: mild neck pain Pain location: neck, LUE Pain description: soreness Aggravating factors: repetitive motion of forward bending Relieving factors: sometimes heating pad  PRECAUTIONS: Fall and Other: dizziness sometimes with exercise on recumbent bike  RED FLAGS: None   WEIGHT BEARING RESTRICTIONS: No  FALLS: Has patient fallen in last 6 months? No  LIVING ENVIRONMENT: Lives with: lives with their spouse Lives in: House/apartment Stairs: 1 step into home Has following equipment at home: Single point cane  PLOF: Independent and Vocation/Vocational requirements: works part-time as Location manager Enjoys walking in neighborhood  PATIENT GOALS: Improve balance and inner ear/neck  OBJECTIVE:  Note: Objective measures were completed at Evaluation unless otherwise noted.  DIAGNOSTIC FINDINGS: NA for this episode  COGNITION: Overall cognitive status: Within functional limits for tasks assessed   POSTURE:  rounded shoulders, forward head, and valgus stance at knees  Cervical ROM:    Active A/PROM (deg) eval  Flexion 15 mild pain  Extension 15 mild pain  Right lateral flexion   Left lateral flexion   Right rotation 50  Left rotation 50  (Blank rows = not tested)  BED MOBILITY:  NT  TRANSFERS: Assistive device utilized: None  Sit to stand: Modified independence Stand to sit: Modified independence  GAIT: Gait  pattern: step through pattern, wide BOS, valgus position at L knee Distance walked: clinic distances/gait not fully assessed due to time constraints Assistive device utilized: None Level of assistance: Modified independence  PATIENT SURVEYS:  DHI: THE DIZZINESS HANDICAP INVENTORY (DHI)  P1. Does looking up increase your problem? 2 = Sometimes  E2. Because of your problem, do you feel frustrated? 2 = Sometimes  F3. Because of your problem, do you restrict your travel for business or recreation?  2 = Sometimes  P4. Does walking down the aisle of a supermarket increase your problems?  2 = Sometimes  F5. Because of your problem, do you have difficulty getting into or out of bed?  2 = Sometimes  F6. Does your problem significantly restrict your participation in social activities, such as going out to dinner, going to the movies, dancing, or going to parties? 2 = Sometimes  F7. Because of your problem, do you have difficulty reading?  2 = Sometimes  P8. Does performing more ambitious activities such as sports, dancing, household chores (sweeping or putting dishes away) increase your problems?  2 = Sometimes  E9. Because of your problem, are you afraid to leave your home without having without having someone accompany you?  0 = No  E10. Because of your problem have you been embarrassed in front of others?  0 = No  P11. Do quick movements of your head increase your problem?  2 = Sometimes  F12. Because of your problem, do you avoid heights?  4 = Yes  P13. Does turning over in bed increase your problem?  2 = Sometimes  F14. Because of your problem, is it difficult for you to do strenuous homework or yard work? 2 = Sometimes  E15. Because of your problem, are you afraid people may think you are intoxicated? 0 = No  F16. Because of your problem, is it difficult for you to go for a walk by yourself?  2 = Sometimes  P17. Does walking down a sidewalk increase your problem?  2 = Sometimes  E18.Because of  your problem, is it difficult for you to concentrate 2 = Sometimes  F19. Because of your problem, is it difficult for you to walk around your house in the dark? 2 = Sometimes  E20. Because of your problem, are you afraid to stay home alone?  0 = No  E21. Because of your problem, do you feel handicapped? 0 = No  E22. Has the problem placed stress on your relationships with members of your family or friends? 0 = No  E23. Because of your problem, are you depressed?  0 = No  F24. Does your problem interfere with your job or household responsibilities?  2 = Sometimes  P25. Does bending over increase your problem?  2 = Sometimes  TOTAL 38    DHI Scoring Instructions  The patient is asked to answer each question as it pertains to dizziness or unsteadiness problems, specifically  considering their condition during the last month. Questions are designed to incorporate functional (F), physical  (P), and emotional (E) impacts on disability.   Scores greater than 10 points should be referred to balance specialists for further evaluation.   16-34 Points (mild handicap)  36-52 Points (moderate handicap)  54+ Points (severe handicap)  Minimally Detectable Change: 17 points ([address] [place], 1990)  Gerber, G. SHAUNNA. and [place], C. W. (1990). The development of the Dizziness Handicap Inventory. Archives of Otolaryngology - Head and Neck Surgery 116(4): [id number].   VESTIBULAR ASSESSMENT:  GENERAL OBSERVATION: No acute distress   SYMPTOM BEHAVIOR:  Subjective history: Every once in a while, I will get dizzy; not sure if it is neck or medication related.  Sometimes a hissing noise of fullness in my ear.  If I walk downstairs or at an angle, feels like I will fall over.  Every once in a while, get spinning. Off and on, been going on for a little while 6 months.  Have had it in the past, but it went away on its own.Denies fall or head injury, no viral illness, occasional headaches does not classify as  migraines.    Non-Vestibular symptoms: changes in hearing, neck pain, headaches, and hissing in L ear, occasional nausea  Type of dizziness: Imbalance (Disequilibrium), Spinning/Vertigo, and Unsteady with head/body turns  Frequency: 3-4x/wk  Duration: seconds  Aggravating factors: Induced by position change: lying supine and supine to sit and Induced by motion: occur when walking, bending down to the ground, and stairs, inclines  Relieving factors: slow movements  Progression of symptoms: better/varies  OCULOMOTOR EXAM:Wears progressive lenses (just got new ones)  Ocular Alignment: normal  Ocular ROM: No Limitations  Spontaneous Nystagmus: absent  Gaze-Induced Nystagmus: absent  Smooth Pursuits: intact  Saccades: intact  Convergence/Divergence: 3 cm    VESTIBULAR - OCULAR REFLEX:   Slow VOR: Comment: horizontal rates as 4/10 symptoms; tightness in neck with vertical; mild dizziness vertical 4/10  VOR Cancellation: Comment: 2/10 with horizontal  Head-Impulse Test: HIT Left: doesn't appear positive; but pt reports improved symptoms; negative R  Dynamic Visual Acuity: Not able to be assessed **Pt takes phone call from cardiologist office during evaluation, so time limited for fully vestibular assessment with positional testing POSITIONAL TESTING: Other: NT due to time constraints  MOTION SENSITIVITY: NOT TESTED AT EVAL  Motion Sensitivity Quotient Intensity: 0 = none, 1 = Lightheaded, 2 = Mild, 3 = Moderate, 4 = Severe, 5 = Vomiting  Intensity  1. Sitting to supine   2. Supine to L side   3. Supine to R side   4. Supine to sitting   5. L Hallpike-Dix   6. Up from L    7. R Hallpike-Dix   8. Up from R    9. Sitting, head tipped to L knee   10. Head up from L knee   11. Sitting, head tipped to R knee   12. Head up from R knee   13. Sitting head turns x5   14.Sitting head nods x5   15. In stance, 180 turn to L    16. In stance, 180 turn to R       M-CTSIB  Condition 1:  Firm Surface, EO 30 Sec, Normal Sway  Condition 2: Firm Surface, EC 15.29 Sec, Moderate Sway  Condition 3: Foam Surface, EO 30 Sec, Mild Sway  Condition 4: Foam Surface, EC 6 Sec, Moderate Sway    Pt reports Conditions 2 and 4 are  very much like his symptoms of imbalance/fear of falling                                                                                                                           TREATMENT DATE: 05/11/2024     PATIENT EDUCATION: Education details: Eval results, POC, rationale for vestibular rehab and looking further at neck ROM and tightness playing a role in dizziness Person educated: Patient Education method: Explanation Education comprehension: verbalized understanding  HOME EXERCISE PROGRAM:  GOALS: Goals reviewed with patient? Yes  SHORT TERM GOALS: Target date: 05/22/2024  Pt will be independent with HEP for improved dizziness, balance. Baseline: Goal status: INITIAL    LONG TERM GOALS: Target date: 06/26/2024  Pt will be independent with HEP for improved dizziness, balance, neck flexibility/pain. Baseline:  Goal status: INITIAL  2.  Neck flexion and extension to improve by at least 10 degrees for improved neck motion/decreased pain and dizziness. Baseline:  Goal status: INITIAL  3.  DHI score to improve to less than or equal to 20 for improved dizziness impacting daily activities. Baseline:  Goal status: INITIAL  4.  MCTSIB Condition 2 and 4 to improve to 30 seconds mod or better sway, for improved balance. Baseline:  Goal status: INITIAL    ASSESSMENT:  CLINICAL IMPRESSION: Patient is a 68 y.o. male who was seen today for physical therapy evaluation and treatment for BPPV/dizziness.  He reports about 6 months timeframe of dizziness, which is sometimes spinning, but also sometimes imbalance and feeling that he may fall, especially when he looks down or is on slopes/steps.  He denies head injury, fall, viral illness; he does report  hearing changes as hissing in L ear, and he notes getting new progressive lenses in the not to distant past.  He does also note significant planned weight loss through diet and exercise in this year.  Oculomotor testing is WFL (with glasses on); pt reports increased symptoms with slow VOR and VOR cancellation and L HIT (not overtly positive, but pt reports symptoms).  With MCTSIB test, he does not complete full 30 seconds on Conditions 2 and 4 with eyes closed, reporting this is like the sensation of off balance, like he may fall.  *Of note, not able to fully assess positional vertigo testing due to time constraints, as pt took phone call from cardiologist office during our PT eval.  DHI score of 38 indicates moderate disability due to dizziness.  He would benefit from skilled PT to address the above stated deficits to improve overall functional mobility, fall risk, and decreased dizziness.  OBJECTIVE IMPAIRMENTS: Abnormal gait, decreased balance, difficulty walking, decreased ROM, dizziness, impaired flexibility, and pain.   ACTIVITY LIMITATIONS: standing, squatting, stairs, transfers, reach over head, and locomotion level  PARTICIPATION LIMITATIONS: meal prep, cleaning, laundry, and community activity  PERSONAL FACTORS: 3+ comorbidities: see PMH are also affecting patient's functional outcome.   REHAB POTENTIAL: Good  CLINICAL DECISION MAKING: Stable/uncomplicated  EVALUATION COMPLEXITY:  Low   PLAN:  PT FREQUENCY: 1-2x/week  PT DURATION: 6 weeks plus eval  PLANNED INTERVENTIONS: 97750- Physical Performance Testing, 97110-Therapeutic exercises, 97530- Therapeutic activity, 97112- Neuromuscular re-education, 97535- Self Care, [id number]- Manual therapy, [id number]- Canalith repositioning, Patient/Family education, Balance training, and Vestibular training  PLAN FOR NEXT SESSION: Positional testing for BPPV; initiate HEP for gaze stabilization, multi-sensory balance and neck  flexibility   Boluwatife Flight W., PT 05/12/2024, 7:33 AM   [facility] at [facility] [address], [address] [place], [place] [address] Phone # [phone] Fax # [phone]

## 2024-05-14 NOTE — Telephone Encounter (Signed)
 Lonni Slain, MD to Me (Selected Message)    05/11/24  8:17 PM I do not see an indication for his metoprolol  beyond blood pressure. I would stop the metoprolol , monitor blood pressure, and see if this improves his symptoms. Thanks.  Left message to call back

## 2024-05-14 NOTE — Telephone Encounter (Signed)
 Advised patient, verbalized understanding  He will decrease to 1/2 tablet daily for 3 days and either d/c or do 1/2 every other day for 2 doses and then stop   Patient did mention AFib multiple times secondary to HR jumping up suddenly when he is exerting himself but comes back down. Asked if he had ever had AFib before, he has not. Asked if he had the ability to do EKG on watch, said he did. Advised for him to try to catch when has. Again happens with exertion   Patient to call back in about a week to follow up

## 2024-05-14 NOTE — Telephone Encounter (Signed)
 Left message to call back, see phone note

## 2024-05-14 NOTE — Telephone Encounter (Signed)
 Pt returning call. Please advise.

## 2024-05-21 ENCOUNTER — Ambulatory Visit: Attending: Registered Nurse | Admitting: Physical Therapy

## 2024-05-21 ENCOUNTER — Encounter: Payer: Self-pay | Admitting: Physical Therapy

## 2024-05-21 DIAGNOSIS — M6281 Muscle weakness (generalized): Secondary | ICD-10-CM | POA: Insufficient documentation

## 2024-05-21 DIAGNOSIS — R2681 Unsteadiness on feet: Secondary | ICD-10-CM | POA: Diagnosis not present

## 2024-05-21 DIAGNOSIS — R42 Dizziness and giddiness: Secondary | ICD-10-CM | POA: Insufficient documentation

## 2024-05-21 DIAGNOSIS — R2689 Other abnormalities of gait and mobility: Secondary | ICD-10-CM | POA: Insufficient documentation

## 2024-05-21 NOTE — Therapy (Signed)
 OUTPATIENT PHYSICAL THERAPY VESTIBULAR TREATMENT NOTE     Patient Name: Joseph Compton MRN: [id number] DOB:10/29/1955, 68 y.o., male Today's Date: 05/21/2024  END OF SESSION:  PT End of Session - 05/21/24 0850     Visit Number 2    Number of Visits 13    Date for Recertification  06/26/24    Authorization Type Medicare/BCBS    PT Start Time 0850    PT Stop Time 0930    PT Time Calculation (min) 40 min    Activity Tolerance Patient tolerated treatment well    Behavior During Therapy [facility] for tasks assessed/performed           Past Medical History:  Diagnosis Date   Abdominal aortic aneurysm (AAA) 3.0 cm to 5.5 cm in diameter in male 03/07/2022   PER PATIENT   Arthritis    Chest pain on exertion    Chronic kidney disease, stage 3a (HCC)    GERD (gastroesophageal reflux disease)    History of Helicobacter pylori infection    in [place]    History of transient ischemic attack (TIA)    per patient   Hyperlipemia    Hypertension    Hypertensive kidney disease with CKD (chronic kidney disease) stage I    Impaired fasting glucose    Obesity    Radiculopathy, lumbar region    Past Surgical History:  Procedure Laterality Date   MOUTH SURGERY  2000   Patient Active Problem List   Diagnosis Date Noted   Dyslipidemia, goal LDL below 70 07/27/2022   Lumbar radiculopathy 04/05/2021   Pain in left foot 09/17/2019   Degeneration of lumbar intervertebral disc 11/17/2018   Epidural lipomatosis 11/17/2018   Lumbar disc herniation with radiculopathy 11/06/2018    PCP: Shepard Ade, MD  REFERRING PROVIDER: Verta Izetta HERO, NP   REFERRING DIAG:  [phone] (ICD-10-CM) - Benign paroxysmal vertigo, bilateral  R42 (ICD-10-CM) - Dizziness and giddiness  M79.602 (ICD-10-CM) - Pain in left arm  M54.12 (ICD-10-CM) - Radiculopathy, cervical region    THERAPY DIAG:  Dizziness and giddiness  Unsteadiness on feet  ONSET DATE: 04/27/2024  Rationale for Evaluation and  Treatment: Rehabilitation  SUBJECTIVE:   SUBJECTIVE STATEMENT: Feel like I'm still off balance-maybe a little better.  Still have the buzzing in left ear and tightness in my neck  Pt accompanied by: self  PERTINENT HISTORY: arthritis R Knee> L knee, lumbar radiculopathy, lumbar disc degeneration  PAIN:  Are you having pain? Yes: NPRS scale: mild neck pain Pain location: neck, LUE Pain description: soreness Aggravating factors: repetitive motion of forward bending Relieving factors: sometimes heating pad  PRECAUTIONS: Fall and Other: dizziness sometimes with exercise on recumbent bike  RED FLAGS: None   WEIGHT BEARING RESTRICTIONS: No  FALLS: Has patient fallen in last 6 months? No  LIVING ENVIRONMENT: Lives with: lives with their spouse Lives in: House/apartment Stairs: 1 step into home Has following equipment at home: Single point cane  PLOF: Independent and Vocation/Vocational requirements: works part-time as Location manager Enjoys walking in neighborhood  PATIENT GOALS: Improve balance and inner ear/neck  OBJECTIVE:   TODAY'S TREATMENT: 05/21/2024 Activity Comments  R DH No nystagmus, mild dizziness  L DH No nystagmus, no diziness  R roll test No nystagmus, mild dizziness  L roll test No nystagmus, slightly more dizziness  Seated BP measures: 122/85 HR 58 bpm  Standing BP measures 117/85 HR 59 bpm  Seated VOR x 1 15 seconds x 3 Rates 4/10 all sets, settles to baseline within  30 seconds  Standing EO:  feet apart x 3 feet together x 3 BUE support>1 UE support>no support (increased sway)   Access Code: [email or web address] URL: [email or web address] Date: 05/21/2024 Prepared by: [facility] - Outpatient  Rehab - [facility]  Exercises - Corner Balance Feet Together With Eyes Closed  - 1 x daily - 7 x weekly - 1 sets - 3 reps - 30 sec hold - Seated Gaze Stabilization with Head Rotation  - 1 x daily - 7 x weekly - 1 sets - 3 reps - 15-30 sec hold  PATIENT  EDUCATION: Education details: HEP initiated, rationale for VOR and multi-sensory balance work; use of walking pole/benefit for stability on outdoor surfaces Person educated: Patient Education method: Programmer, multimedia, Demonstration, and Handouts Education comprehension: verbalized understanding, returned demonstration, and needs further education  ---------------------------------------------- Note: Objective measures were completed at Evaluation unless otherwise noted.  DIAGNOSTIC FINDINGS: NA for this episode  COGNITION: Overall cognitive status: Within functional limits for tasks assessed   POSTURE:  rounded shoulders, forward head, and valgus stance at knees  Cervical ROM:    Active A/PROM (deg) eval  Flexion 15 mild pain  Extension 15 mild pain  Right lateral flexion   Left lateral flexion   Right rotation 50  Left rotation 50  (Blank rows = not tested)  BED MOBILITY:  NT  TRANSFERS: Assistive device utilized: None  Sit to stand: Modified independence Stand to sit: Modified independence  GAIT: Gait pattern: step through pattern, wide BOS, valgus position at L knee Distance walked: clinic distances/gait not fully assessed due to time constraints Assistive device utilized: None Level of assistance: Modified independence  PATIENT SURVEYS:  DHI: THE DIZZINESS HANDICAP INVENTORY (DHI)  P1. Does looking up increase your problem? 2 = Sometimes  E2. Because of your problem, do you feel frustrated? 2 = Sometimes  F3. Because of your problem, do you restrict your travel for business or recreation?  2 = Sometimes  P4. Does walking down the aisle of a supermarket increase your problems?  2 = Sometimes  F5. Because of your problem, do you have difficulty getting into or out of bed?  2 = Sometimes  F6. Does your problem significantly restrict your participation in social activities, such as going out to dinner, going to the movies, dancing, or going to parties? 2 = Sometimes   F7. Because of your problem, do you have difficulty reading?  2 = Sometimes  P8. Does performing more ambitious activities such as sports, dancing, household chores (sweeping or putting dishes away) increase your problems?  2 = Sometimes  E9. Because of your problem, are you afraid to leave your home without having without having someone accompany you?  0 = No  E10. Because of your problem have you been embarrassed in front of others?  0 = No  P11. Do quick movements of your head increase your problem?  2 = Sometimes  F12. Because of your problem, do you avoid heights?  4 = Yes  P13. Does turning over in bed increase your problem?  2 = Sometimes  F14. Because of your problem, is it difficult for you to do strenuous homework or yard work? 2 = Sometimes  E15. Because of your problem, are you afraid people may think you are intoxicated? 0 = No  F16. Because of your problem, is it difficult for you to go for a walk by yourself?  2 = Sometimes  P17. Does walking down a sidewalk increase your problem?  2 = Sometimes  E18.Because of your problem, is it difficult for you to concentrate 2 = Sometimes  F19. Because of your problem, is it difficult for you to walk around your house in the dark? 2 = Sometimes  E20. Because of your problem, are you afraid to stay home alone?  0 = No  E21. Because of your problem, do you feel handicapped? 0 = No  E22. Has the problem placed stress on your relationships with members of your family or friends? 0 = No  E23. Because of your problem, are you depressed?  0 = No  F24. Does your problem interfere with your job or household responsibilities?  2 = Sometimes  P25. Does bending over increase your problem?  2 = Sometimes  TOTAL 38    DHI Scoring Instructions  The patient is asked to answer each question as it pertains to dizziness or unsteadiness problems, specifically  considering their condition during the last month. Questions are designed to incorporate  functional (F), physical  (P), and emotional (E) impacts on disability.   Scores greater than 10 points should be referred to balance specialists for further evaluation.   16-34 Points (mild handicap)  36-52 Points (moderate handicap)  54+ Points (severe handicap)  Minimally Detectable Change: 17 points ([address] [place], 1990)  [place], G. SHAUNNA. and [place], C. W. (1990). The development of the Dizziness Handicap Inventory. Archives of Otolaryngology - Head and Neck Surgery 116(4): [id number].   VESTIBULAR ASSESSMENT:  GENERAL OBSERVATION: No acute distress   SYMPTOM BEHAVIOR:  Subjective history: Every once in a while, I will get dizzy; not sure if it is neck or medication related.  Sometimes a hissing noise of fullness in my ear.  If I walk downstairs or at an angle, feels like I will fall over.  Every once in a while, get spinning. Off and on, been going on for a little while 6 months.  Have had it in the past, but it went away on its own.Denies fall or head injury, no viral illness, occasional headaches does not classify as migraines.    Non-Vestibular symptoms: changes in hearing, neck pain, headaches, and hissing in L ear, occasional nausea  Type of dizziness: Imbalance (Disequilibrium), Spinning/Vertigo, and Unsteady with head/body turns  Frequency: 3-4x/wk  Duration: seconds  Aggravating factors: Induced by position change: lying supine and supine to sit and Induced by motion: occur when walking, bending down to the ground, and stairs, inclines  Relieving factors: slow movements  Progression of symptoms: better/varies  OCULOMOTOR EXAM:Wears progressive lenses (just got new ones)  Ocular Alignment: normal  Ocular ROM: No Limitations  Spontaneous Nystagmus: absent  Gaze-Induced Nystagmus: absent  Smooth Pursuits: intact  Saccades: intact  Convergence/Divergence: 3 cm    VESTIBULAR - OCULAR REFLEX:   Slow VOR: Comment: horizontal rates as 4/10 symptoms; tightness in neck with  vertical; mild dizziness vertical 4/10  VOR Cancellation: Comment: 2/10 with horizontal  Head-Impulse Test: HIT Left: doesn't appear positive; but pt reports improved symptoms; negative R  Dynamic Visual Acuity: Not able to be assessed **Pt takes phone call from cardiologist office during evaluation, so time limited for fully vestibular assessment with positional testing POSITIONAL TESTING: Other: NT due to time constraints  MOTION SENSITIVITY: NOT TESTED AT EVAL  Motion Sensitivity Quotient Intensity: 0 = none, 1 = Lightheaded, 2 = Mild, 3 = Moderate, 4 = Severe, 5 = Vomiting  Intensity  1. Sitting to supine   2. Supine to L side  3. Supine to R side   4. Supine to sitting   5. L Hallpike-Dix   6. Up from L    7. R Hallpike-Dix   8. Up from R    9. Sitting, head tipped to L knee   10. Head up from L knee   11. Sitting, head tipped to R knee   12. Head up from R knee   13. Sitting head turns x5   14.Sitting head nods x5   15. In stance, 180 turn to L    16. In stance, 180 turn to R       M-CTSIB  Condition 1: Firm Surface, EO 30 Sec, Normal Sway  Condition 2: Firm Surface, EC 15.29 Sec, Moderate Sway  Condition 3: Foam Surface, EO 30 Sec, Mild Sway  Condition 4: Foam Surface, EC 6 Sec, Moderate Sway    Pt reports Conditions 2 and 4 are very much like his symptoms of imbalance/fear of falling                                                                                                                           TREATMENT DATE: 05/11/2024     PATIENT EDUCATION: Education details: Eval results, POC, rationale for vestibular rehab and looking further at neck ROM and tightness playing a role in dizziness Person educated: Patient Education method: Explanation Education comprehension: verbalized understanding  HOME EXERCISE PROGRAM:  GOALS: Goals reviewed with patient? Yes  SHORT TERM GOALS: Target date: 05/22/2024  Pt will be independent with HEP for improved  dizziness, balance. Baseline: Goal status: INITIAL    LONG TERM GOALS: Target date: 06/26/2024  Pt will be independent with HEP for improved dizziness, balance, neck flexibility/pain. Baseline:  Goal status: INITIAL  2.  Neck flexion and extension to improve by at least 10 degrees for improved neck motion/decreased pain and dizziness. Baseline:  Goal status: INITIAL  3.  DHI score to improve to less than or equal to 20 for improved dizziness impacting daily activities. Baseline:  Goal status: INITIAL  4.  MCTSIB Condition 2 and 4 to improve to 30 seconds mod or better sway, for improved balance. Baseline:  Goal status: INITIAL    ASSESSMENT:  CLINICAL IMPRESSION: Pt presents today with continued reports that he feels off balance with going down steps and hills. Skilled PT session focused on assessing for BPPV (pt has dizziness in 3 of 4 positions, but no nystagmus noted), initiating exercises for VOR and multi-sensory balance.  He does have increased sway and unsteadiness EC; he does report that he feels he is moving in circles during EC activities. Pt will continue to benefit from skilled PT towards goals for improved functional mobility and decreased fall risk.   OBJECTIVE IMPAIRMENTS: Abnormal gait, decreased balance, difficulty walking, decreased ROM, dizziness, impaired flexibility, and pain.   ACTIVITY LIMITATIONS: standing, squatting, stairs, transfers, reach over head, and locomotion level  PARTICIPATION LIMITATIONS: meal prep, cleaning, laundry, and community activity  PERSONAL FACTORS: 3+ comorbidities: see PMH are also affecting patient's functional outcome.   REHAB POTENTIAL: Good  CLINICAL DECISION MAKING: Stable/uncomplicated  EVALUATION COMPLEXITY: Low   PLAN:  PT FREQUENCY: 1-2x/week  PT DURATION: 6 weeks plus eval  PLANNED INTERVENTIONS: 97750- Physical Performance Testing, 97110-Therapeutic exercises, 97530- Therapeutic activity, 97112-  Neuromuscular re-education, 97535- Self Care, [id number]- Manual therapy, [phone]- Canalith repositioning, Patient/Family education, Balance training, and Vestibular training  PLAN FOR NEXT SESSION: Review HEP for gaze stabilization, multi-sensory balance and neck flexibility; work on neck stretches (add to HEP), limits of stability; step downs, hills/inclines   Millie Forde W., PT 05/21/2024, 1:44 PM   [facility] at [facility] [address], [address] [place], [place] [address] Phone # [phone] Fax # [phone]

## 2024-05-22 ENCOUNTER — Other Ambulatory Visit: Payer: Self-pay | Admitting: Cardiology

## 2024-05-25 DIAGNOSIS — N1832 Chronic kidney disease, stage 3b: Secondary | ICD-10-CM | POA: Diagnosis not present

## 2024-05-27 DIAGNOSIS — E875 Hyperkalemia: Secondary | ICD-10-CM | POA: Diagnosis not present

## 2024-05-27 DIAGNOSIS — N1832 Chronic kidney disease, stage 3b: Secondary | ICD-10-CM | POA: Diagnosis not present

## 2024-05-27 DIAGNOSIS — I714 Abdominal aortic aneurysm, without rupture, unspecified: Secondary | ICD-10-CM | POA: Diagnosis not present

## 2024-05-27 DIAGNOSIS — I129 Hypertensive chronic kidney disease with stage 1 through stage 4 chronic kidney disease, or unspecified chronic kidney disease: Secondary | ICD-10-CM | POA: Diagnosis not present

## 2024-05-27 DIAGNOSIS — N2581 Secondary hyperparathyroidism of renal origin: Secondary | ICD-10-CM | POA: Diagnosis not present

## 2024-05-27 DIAGNOSIS — N179 Acute kidney failure, unspecified: Secondary | ICD-10-CM | POA: Diagnosis not present

## 2024-06-01 ENCOUNTER — Ambulatory Visit

## 2024-06-01 DIAGNOSIS — R2681 Unsteadiness on feet: Secondary | ICD-10-CM

## 2024-06-01 DIAGNOSIS — M6281 Muscle weakness (generalized): Secondary | ICD-10-CM | POA: Diagnosis not present

## 2024-06-01 DIAGNOSIS — R42 Dizziness and giddiness: Secondary | ICD-10-CM | POA: Diagnosis not present

## 2024-06-01 DIAGNOSIS — R2689 Other abnormalities of gait and mobility: Secondary | ICD-10-CM | POA: Diagnosis not present

## 2024-06-01 NOTE — Therapy (Signed)
 OUTPATIENT PHYSICAL THERAPY VESTIBULAR TREATMENT NOTE     Patient Name: Joseph Compton MRN: [id number] DOB:[birth year], 68 y.o., male Today's Date: 06/01/2024  END OF SESSION:  PT End of Session - 06/01/24 1522     Visit Number 3    Number of Visits 13    Date for Recertification  06/26/24    Authorization Type Medicare/BCBS    PT Start Time 1530    PT Stop Time 1615    PT Time Calculation (min) 45 min    Activity Tolerance Patient tolerated treatment well    Behavior During Therapy [facility] for tasks assessed/performed           Past Medical History:  Diagnosis Date   Abdominal aortic aneurysm (AAA) 3.0 cm to 5.5 cm in diameter in male 03/07/2022   PER PATIENT   Arthritis    Chest pain on exertion    Chronic kidney disease, stage 3a (HCC)    GERD (gastroesophageal reflux disease)    History of Helicobacter pylori infection    in [place]    History of transient ischemic attack (TIA)    per patient   Hyperlipemia    Hypertension    Hypertensive kidney disease with CKD (chronic kidney disease) stage I    Impaired fasting glucose    Obesity    Radiculopathy, lumbar region    Past Surgical History:  Procedure Laterality Date   MOUTH SURGERY  2000   Patient Active Problem List   Diagnosis Date Noted   Dyslipidemia, goal LDL below 70 07/27/2022   Lumbar radiculopathy 04/05/2021   Pain in left foot 09/17/2019   Degeneration of lumbar intervertebral disc 11/17/2018   Epidural lipomatosis 11/17/2018   Lumbar disc herniation with radiculopathy 11/06/2018    PCP: Shepard Ade, MD  REFERRING PROVIDER: Verta Izetta HERO, NP   REFERRING DIAG:  [phone] (ICD-10-CM) - Benign paroxysmal vertigo, bilateral  R42 (ICD-10-CM) - Dizziness and giddiness  M79.602 (ICD-10-CM) - Pain in left arm  M54.12 (ICD-10-CM) - Radiculopathy, cervical region    THERAPY DIAG:  Dizziness and giddiness  Unsteadiness on feet  Other abnormalities of gait and mobility  Muscle weakness  (generalized)  ONSET DATE: 04/27/2024  Rationale for Evaluation and Treatment: Rehabilitation  SUBJECTIVE:   SUBJECTIVE STATEMENT: Noting ongoing dizziness as he moves from room to room in the house, as in with arising and initiating activity but it can come on at random.   Pt accompanied by: self  PERTINENT HISTORY: arthritis R Knee> L knee, lumbar radiculopathy, lumbar disc degeneration  PAIN:  Are you having pain? Yes: NPRS scale: mild neck pain Pain location: neck, LUE Pain description: soreness Aggravating factors: repetitive motion of forward bending Relieving factors: sometimes heating pad  PRECAUTIONS: Fall and Other: dizziness sometimes with exercise on recumbent bike  RED FLAGS: None   WEIGHT BEARING RESTRICTIONS: No  FALLS: Has patient fallen in last 6 months? No  LIVING ENVIRONMENT: Lives with: lives with their spouse Lives in: House/apartment Stairs: 1 step into home Has following equipment at home: Single point cane  PLOF: Independent and Vocation/Vocational requirements: works part-time as Location manager Enjoys walking in neighborhood  PATIENT GOALS: Improve balance and inner ear/neck  OBJECTIVE:   TODAY'S TREATMENT: 06/01/24 Activity Comments  Cervical torsion test Right: dizzy/lightheaded 4-6/10 symptoms Left: 5-6/10 symptoms  Head Neck Differentiation Test Dizzy/lightheaded 4/10  Cervical extension SNAG 10x 5 sec hold (lower/upper)   Static multisensory balance -standing on uphill slope eyes closed x 30 sec  -standing on foam feet together:  EO/EC x 30 sec -on firm EO/EC w/ head movements           TODAY'S TREATMENT: 05/21/2024 Activity Comments  R DH No nystagmus, mild dizziness  L DH No nystagmus, no diziness  R roll test No nystagmus, mild dizziness  L roll test No nystagmus, slightly more dizziness  Seated BP measures: 122/85 HR 58 bpm  Standing BP measures 117/85 HR 59 bpm  Seated VOR x 1 15 seconds x 3 Rates 4/10 all sets, settles  to baseline within 30 seconds  Standing EO:  feet apart x 3 feet together x 3 BUE support>1 UE support>no support (increased sway)   Access Code: [email or web address] URL: [email or web address] Date: 05/21/2024 Prepared by: [facility] - Outpatient  Rehab - [facility]  Exercises - Corner Balance Feet Together With Eyes Closed  - 1 x daily - 7 x weekly - 1 sets - 3 reps - 30 sec hold - Seated Gaze Stabilization with Head Rotation  - 1 x daily - 7 x weekly - 1 sets - 3 reps - 15-30 sec hold - Cervical Extension AROM with Strap  - 7 x weekly - 1 sets - 10 reps - 3-5 sec hold - Upper Cervical Extension SNAG with Strap  - 1 x daily - 7 x weekly - 1 sets - 10 reps - 3-5 sec hold - Corner Balance Feet Together: Eyes Open With Head Turns  - 1 x daily - 7 x weekly - 3 sets - 30 sec hold - Corner Balance Feet Together: Eyes Closed With Head Turns  - 1 x daily - 7 x weekly - 3 sets - 30 sec hold  PATIENT EDUCATION: Education details: HEP initiated, rationale for VOR and multi-sensory balance work; use of walking pole/benefit for stability on outdoor surfaces Person educated: Patient Education method: Programmer, multimedia, Demonstration, and Handouts Education comprehension: verbalized understanding, returned demonstration, and needs further education  ---------------------------------------------- Note: Objective measures were completed at Evaluation unless otherwise noted.  DIAGNOSTIC FINDINGS: NA for this episode  COGNITION: Overall cognitive status: Within functional limits for tasks assessed   POSTURE:  rounded shoulders, forward head, and valgus stance at knees  Cervical ROM:    Active A/PROM (deg) eval  Flexion 15 mild pain  Extension 15 mild pain  Right lateral flexion   Left lateral flexion   Right rotation 50  Left rotation 50  (Blank rows = not tested)  BED MOBILITY:  NT  TRANSFERS: Assistive device utilized: None  Sit to stand: Modified independence Stand to sit:  Modified independence  GAIT: Gait pattern: step through pattern, wide BOS, valgus position at L knee Distance walked: clinic distances/gait not fully assessed due to time constraints Assistive device utilized: None Level of assistance: Modified independence  PATIENT SURVEYS:  DHI: THE DIZZINESS HANDICAP INVENTORY (DHI)  P1. Does looking up increase your problem? 2 = Sometimes  E2. Because of your problem, do you feel frustrated? 2 = Sometimes  F3. Because of your problem, do you restrict your travel for business or recreation?  2 = Sometimes  P4. Does walking down the aisle of a supermarket increase your problems?  2 = Sometimes  F5. Because of your problem, do you have difficulty getting into or out of bed?  2 = Sometimes  F6. Does your problem significantly restrict your participation in social activities, such as going out to dinner, going to the movies, dancing, or going to parties? 2 = Sometimes  F7. Because of your problem, do you have  difficulty reading?  2 = Sometimes  P8. Does performing more ambitious activities such as sports, dancing, household chores (sweeping or putting dishes away) increase your problems?  2 = Sometimes  E9. Because of your problem, are you afraid to leave your home without having without having someone accompany you?  0 = No  E10. Because of your problem have you been embarrassed in front of others?  0 = No  P11. Do quick movements of your head increase your problem?  2 = Sometimes  F12. Because of your problem, do you avoid heights?  4 = Yes  P13. Does turning over in bed increase your problem?  2 = Sometimes  F14. Because of your problem, is it difficult for you to do strenuous homework or yard work? 2 = Sometimes  E15. Because of your problem, are you afraid people may think you are intoxicated? 0 = No  F16. Because of your problem, is it difficult for you to go for a walk by yourself?  2 = Sometimes  P17. Does walking down a sidewalk increase your  problem?  2 = Sometimes  E18.Because of your problem, is it difficult for you to concentrate 2 = Sometimes  F19. Because of your problem, is it difficult for you to walk around your house in the dark? 2 = Sometimes  E20. Because of your problem, are you afraid to stay home alone?  0 = No  E21. Because of your problem, do you feel handicapped? 0 = No  E22. Has the problem placed stress on your relationships with members of your family or friends? 0 = No  E23. Because of your problem, are you depressed?  0 = No  F24. Does your problem interfere with your job or household responsibilities?  2 = Sometimes  P25. Does bending over increase your problem?  2 = Sometimes  TOTAL 38    DHI Scoring Instructions  The patient is asked to answer each question as it pertains to dizziness or unsteadiness problems, specifically  considering their condition during the last month. Questions are designed to incorporate functional (F), physical  (P), and emotional (E) impacts on disability.   Scores greater than 10 points should be referred to balance specialists for further evaluation.   16-34 Points (mild handicap)  36-52 Points (moderate handicap)  54+ Points (severe handicap)  Minimally Detectable Change: 17 points ([address] [place], 1990)  [place], G. SHAUNNA. and [place], C. W. (1990). The development of the Dizziness Handicap Inventory. Archives of Otolaryngology - Head and Neck Surgery 116(4): [id number].   VESTIBULAR ASSESSMENT:  GENERAL OBSERVATION: No acute distress   SYMPTOM BEHAVIOR:  Subjective history: Every once in a while, I will get dizzy; not sure if it is neck or medication related.  Sometimes a hissing noise of fullness in my ear.  If I walk downstairs or at an angle, feels like I will fall over.  Every once in a while, get spinning. Off and on, been going on for a little while 6 months.  Have had it in the past, but it went away on its own.Denies fall or head injury, no viral illness,  occasional headaches does not classify as migraines.    Non-Vestibular symptoms: changes in hearing, neck pain, headaches, and hissing in L ear, occasional nausea  Type of dizziness: Imbalance (Disequilibrium), Spinning/Vertigo, and Unsteady with head/body turns  Frequency: 3-4x/wk  Duration: seconds  Aggravating factors: Induced by position change: lying supine and supine to sit and Induced by  motion: occur when walking, bending down to the ground, and stairs, inclines  Relieving factors: slow movements  Progression of symptoms: better/varies  OCULOMOTOR EXAM:Wears progressive lenses (just got new ones)  Ocular Alignment: normal  Ocular ROM: No Limitations  Spontaneous Nystagmus: absent  Gaze-Induced Nystagmus: absent  Smooth Pursuits: intact  Saccades: intact  Convergence/Divergence: 3 cm    VESTIBULAR - OCULAR REFLEX:   Slow VOR: Comment: horizontal rates as 4/10 symptoms; tightness in neck with vertical; mild dizziness vertical 4/10  VOR Cancellation: Comment: 2/10 with horizontal  Head-Impulse Test: HIT Left: doesn't appear positive; but pt reports improved symptoms; negative R  Dynamic Visual Acuity: Not able to be assessed **Pt takes phone call from cardiologist office during evaluation, so time limited for fully vestibular assessment with positional testing POSITIONAL TESTING: Other: NT due to time constraints  MOTION SENSITIVITY: NOT TESTED AT EVAL  Motion Sensitivity Quotient Intensity: 0 = none, 1 = Lightheaded, 2 = Mild, 3 = Moderate, 4 = Severe, 5 = Vomiting  Intensity  1. Sitting to supine   2. Supine to L side   3. Supine to R side   4. Supine to sitting   5. L Hallpike-Dix   6. Up from L    7. R Hallpike-Dix   8. Up from R    9. Sitting, head tipped to L knee   10. Head up from L knee   11. Sitting, head tipped to R knee   12. Head up from R knee   13. Sitting head turns x5   14.Sitting head nods x5   15. In stance, 180 turn to L    16. In stance, 180  turn to R       M-CTSIB  Condition 1: Firm Surface, EO 30 Sec, Normal Sway  Condition 2: Firm Surface, EC 15.29 Sec, Moderate Sway  Condition 3: Foam Surface, EO 30 Sec, Mild Sway  Condition 4: Foam Surface, EC 6 Sec, Moderate Sway    Pt reports Conditions 2 and 4 are very much like his symptoms of imbalance/fear of falling                                                                                                                           TREATMENT DATE: 05/11/2024     PATIENT EDUCATION: Education details: Eval results, POC, rationale for vestibular rehab and looking further at neck ROM and tightness playing a role in dizziness Person educated: Patient Education method: Explanation Education comprehension: verbalized understanding  HOME EXERCISE PROGRAM:  GOALS: Goals reviewed with patient? Yes  SHORT TERM GOALS: Target date: 05/22/2024  Pt will be independent with HEP for improved dizziness, balance. Baseline: Goal status: MET    LONG TERM GOALS: Target date: 06/26/2024  Pt will be independent with HEP for improved dizziness, balance, neck flexibility/pain. Baseline:  Goal status: INITIAL  2.  Neck flexion and extension to improve by at least 10 degrees for improved neck motion/decreased pain and  dizziness. Baseline:  Goal status: INITIAL  3.  DHI score to improve to less than or equal to 20 for improved dizziness impacting daily activities. Baseline:  Goal status: INITIAL  4.  MCTSIB Condition 2 and 4 to improve to 30 seconds mod or better sway, for improved balance. Baseline:  Goal status: INITIAL    ASSESSMENT:  CLINICAL IMPRESSION: Notes ongoing sensation of dizziness/imbalance and notes increased difficulty with postural control with descending stairs feeling that he will fall forwards.  Cervical spine tests provocative for feeling dizzy/lightheaded w/ torsion and head/neck differentiation but difficult to determine if nystagmus present. Instructed  in mobilization with movement techniques to improve neck ROM and proprioceptive awareness by using strap for feedback. Progressed multisensory balance challenges as pt reports sensation of excessive sway/LOB to vision eliminated balance challenges. Good return demonstration to activities and provided w/ updated instructions.  OBJECTIVE IMPAIRMENTS: Abnormal gait, decreased balance, difficulty walking, decreased ROM, dizziness, impaired flexibility, and pain.   ACTIVITY LIMITATIONS: standing, squatting, stairs, transfers, reach over head, and locomotion level  PARTICIPATION LIMITATIONS: meal prep, cleaning, laundry, and community activity  PERSONAL FACTORS: 3+ comorbidities: see PMH are also affecting patient's functional outcome.   REHAB POTENTIAL: Good  CLINICAL DECISION MAKING: Stable/uncomplicated  EVALUATION COMPLEXITY: Low   PLAN:  PT FREQUENCY: 1-2x/week  PT DURATION: 6 weeks plus eval  PLANNED INTERVENTIONS: 97750- Physical Performance Testing, 97110-Therapeutic exercises, 97530- Therapeutic activity, 97112- Neuromuscular re-education, 97535- Self Care, [id number]- Manual therapy, [phone]- Canalith repositioning, Patient/Family education, Balance training, and Vestibular training  PLAN FOR NEXT SESSION: Review HEP for gaze stabilization, multi-sensory balance and neck flexibility; work on neck stretches (add to HEP), limits of stability; step downs, hills/inclines   Jonette MARLA Sandifer, PT 06/01/2024, 3:22 PM   [facility] at [facility] [address] [place], [place] [address] Phone # [phone] Fax # [phone]

## 2024-06-02 ENCOUNTER — Ambulatory Visit (HOSPITAL_COMMUNITY)
Admission: RE | Admit: 2024-06-02 | Discharge: 2024-06-02 | Disposition: A | Discharge status: Home / Self Care | Source: Ambulatory Visit | Attending: Cardiology | Admitting: Cardiology

## 2024-06-02 DIAGNOSIS — I7121 Aneurysm of the ascending aorta, without rupture: Secondary | ICD-10-CM | POA: Diagnosis not present

## 2024-06-02 DIAGNOSIS — I77810 Thoracic aortic ectasia: Secondary | ICD-10-CM | POA: Diagnosis not present

## 2024-06-05 NOTE — Therapy (Signed)
 OUTPATIENT PHYSICAL THERAPY VESTIBULAR TREATMENT NOTE     Patient Name: Joseph Compton MRN: [id number] DOB:July 11, 1956, 68 y.o., male Today's Date: 06/08/2024  END OF SESSION:  PT End of Session - 06/08/24 1613     Visit Number 4    Number of Visits 13    Date for Recertification  06/26/24    Authorization Type Medicare/BCBS    PT Start Time 1541   pt late   PT Stop Time 1615    PT Time Calculation (min) 34 min    Equipment Utilized During Treatment Gait belt    Activity Tolerance Patient tolerated treatment well    Behavior During Therapy WFL for tasks assessed/performed            Past Medical History:  Diagnosis Date   Abdominal aortic aneurysm (AAA) 3.0 cm to 5.5 cm in diameter in male 03/07/2022   PER PATIENT   Arthritis    Chest pain on exertion    Chronic kidney disease, stage 3a (HCC)    GERD (gastroesophageal reflux disease)    History of Helicobacter pylori infection    in [place]    History of transient ischemic attack (TIA)    per patient   Hyperlipemia    Hypertension    Hypertensive kidney disease with CKD (chronic kidney disease) stage I    Impaired fasting glucose    Obesity    Radiculopathy, lumbar region    Past Surgical History:  Procedure Laterality Date   MOUTH SURGERY  2000   Patient Active Problem List   Diagnosis Date Noted   Dyslipidemia, goal LDL below 70 07/27/2022   Lumbar radiculopathy 04/05/2021   Pain in left foot 09/17/2019   Degeneration of lumbar intervertebral disc 11/17/2018   Epidural lipomatosis 11/17/2018   Lumbar disc herniation with radiculopathy 11/06/2018    PCP: Shepard Ade, MD  REFERRING PROVIDER: Verta Izetta HERO, NP   REFERRING DIAG:  [phone] (ICD-10-CM) - Benign paroxysmal vertigo, bilateral  R42 (ICD-10-CM) - Dizziness and giddiness  M79.602 (ICD-10-CM) - Pain in left arm  M54.12 (ICD-10-CM) - Radiculopathy, cervical region    THERAPY DIAG:  Dizziness and giddiness  Unsteadiness on  feet  Other abnormalities of gait and mobility  Muscle weakness (generalized)  ONSET DATE: 04/27/2024  Rationale for Evaluation and Treatment: Rehabilitation  SUBJECTIVE:   SUBJECTIVE STATEMENT: Reports that the vertigo came back a little bit. Worse with standing up and when moving in a certain way. Has a hard time determining aggravating factors. Feels that it is d/t his neck. Reports that going down stairs makes him feel unsteady and like he is going to fall.   Pt accompanied by: self  PERTINENT HISTORY: arthritis R Knee> L knee, lumbar radiculopathy, lumbar disc degeneration  PAIN:  Are you having pain? Yes: NPRS scale: 3/10 Pain location: neck, R knee  Pain description: soreness Aggravating factors: repetitive motion of forward bending Relieving factors: sometimes heating pad  PRECAUTIONS: Fall and Other: dizziness sometimes with exercise on recumbent bike  RED FLAGS: None   WEIGHT BEARING RESTRICTIONS: No  FALLS: Has patient fallen in last 6 months? No  LIVING ENVIRONMENT: Lives with: lives with their spouse Lives in: House/apartment Stairs: 1 step into home Has following equipment at home: Single point cane  PLOF: Independent and Vocation/Vocational requirements: works part-time as location manager Enjoys walking in neighborhood  PATIENT GOALS: Improve balance and inner ear/neck  OBJECTIVE:     TODAY'S TREATMENT: 06/08/24 Activity Comments  Stair navigation  Instructed and practiced up with  the stronger leg (L), down with the weakner leg (R). Pt required several practice trials but able to demo proper sequencing with B rails   STS without UEs 10x Focus on controlled descent. C/o R knee pain, thus finished with airex under bottom   R LAQ with yellow TB 10x Tolerated despite c/o R knee pain     HOME EXERCISE PROGRAM Last updated: 06/08/24 Access Code: [email or web address] URL: [email or web address] Date: 06/08/2024 Prepared by: [facility] - Outpatient  Rehab  - [facility]  Exercises - Corner Balance Feet Together With Eyes Closed  - 1 x daily - 7 x weekly - 1 sets - 3 reps - 30 sec hold - Seated Gaze Stabilization with Head Rotation  - 1 x daily - 7 x weekly - 1 sets - 3 reps - 15-30 sec hold - Cervical Extension AROM with Strap  - 7 x weekly - 1 sets - 10 reps - 3-5 sec hold - Upper Cervical Extension SNAG with Strap  - 1 x daily - 7 x weekly - 1 sets - 10 reps - 3-5 sec hold - Corner Balance Feet Together: Eyes Open With Head Turns  - 1 x daily - 7 x weekly - 3 sets - 30 sec hold - Corner Balance Feet Together: Eyes Closed With Head Turns  - 1 x daily - 7 x weekly - 3 sets - 30 sec hold - Supine Active Straight Leg Raise  - 1 x daily - 5 x weekly - 2 sets - 10 reps - Sit to Stand with Arms Crossed  - 1 x daily - 5 x weekly - 2 sets - 10 reps - Sitting Knee Extension with Resistance  - 1 x daily - 5 x weekly - 2 sets - 10 reps    PATIENT EDUCATION: Education details: edu on proper stair sequencing, muscles responsible for controlled stair and STS descent, edu on different and safe modes to address quad weakness, HEP update  Person educated: Patient Education method: Explanation, Demonstration, Tactile cues, Verbal cues, and Handouts Education comprehension: verbalized understanding and returned demonstration     ---------------------------------------------- Note: Objective measures were completed at Evaluation unless otherwise noted.  DIAGNOSTIC FINDINGS: NA for this episode  COGNITION: Overall cognitive status: Within functional limits for tasks assessed   POSTURE:  rounded shoulders, forward head, and valgus stance at knees  Cervical ROM:    Active A/PROM (deg) eval  Flexion 15 mild pain  Extension 15 mild pain  Right lateral flexion   Left lateral flexion   Right rotation 50  Left rotation 50  (Blank rows = not tested)  BED MOBILITY:  NT  TRANSFERS: Assistive device utilized: None  Sit to stand:  Modified independence Stand to sit: Modified independence  GAIT: Gait pattern: step through pattern, wide BOS, valgus position at L knee Distance walked: clinic distances/gait not fully assessed due to time constraints Assistive device utilized: None Level of assistance: Modified independence  PATIENT SURVEYS:  DHI: THE DIZZINESS HANDICAP INVENTORY (DHI)  P1. Does looking up increase your problem? 2 = Sometimes  E2. Because of your problem, do you feel frustrated? 2 = Sometimes  F3. Because of your problem, do you restrict your travel for business or recreation?  2 = Sometimes  P4. Does walking down the aisle of a supermarket increase your problems?  2 = Sometimes  F5. Because of your problem, do you have difficulty getting into or out of bed?  2 = Sometimes  F6. Does your problem  significantly restrict your participation in social activities, such as going out to dinner, going to the movies, dancing, or going to parties? 2 = Sometimes  F7. Because of your problem, do you have difficulty reading?  2 = Sometimes  P8. Does performing more ambitious activities such as sports, dancing, household chores (sweeping or putting dishes away) increase your problems?  2 = Sometimes  E9. Because of your problem, are you afraid to leave your home without having without having someone accompany you?  0 = No  E10. Because of your problem have you been embarrassed in front of others?  0 = No  P11. Do quick movements of your head increase your problem?  2 = Sometimes  F12. Because of your problem, do you avoid heights?  4 = Yes  P13. Does turning over in bed increase your problem?  2 = Sometimes  F14. Because of your problem, is it difficult for you to do strenuous homework or yard work? 2 = Sometimes  E15. Because of your problem, are you afraid people may think you are intoxicated? 0 = No  F16. Because of your problem, is it difficult for you to go for a walk by yourself?  2 = Sometimes  P17. Does  walking down a sidewalk increase your problem?  2 = Sometimes  E18.Because of your problem, is it difficult for you to concentrate 2 = Sometimes  F19. Because of your problem, is it difficult for you to walk around your house in the dark? 2 = Sometimes  E20. Because of your problem, are you afraid to stay home alone?  0 = No  E21. Because of your problem, do you feel handicapped? 0 = No  E22. Has the problem placed stress on your relationships with members of your family or friends? 0 = No  E23. Because of your problem, are you depressed?  0 = No  F24. Does your problem interfere with your job or household responsibilities?  2 = Sometimes  P25. Does bending over increase your problem?  2 = Sometimes  TOTAL 38    DHI Scoring Instructions  The patient is asked to answer each question as it pertains to dizziness or unsteadiness problems, specifically  considering their condition during the last month. Questions are designed to incorporate functional (F), physical  (P), and emotional (E) impacts on disability.   Scores greater than 10 points should be referred to balance specialists for further evaluation.   16-34 Points (mild handicap)  36-52 Points (moderate handicap)  54+ Points (severe handicap)  Minimally Detectable Change: 17 points ([address] [place], 1990)  [address], G. SHAUNNA. and Olla, C. W. (1990). The development of the Dizziness Handicap Inventory. Archives of Otolaryngology - Head and Neck Surgery 116(4): [id number].   VESTIBULAR ASSESSMENT:  GENERAL OBSERVATION: No acute distress   SYMPTOM BEHAVIOR:  Subjective history: Every once in a while, I will get dizzy; not sure if it is neck or medication related.  Sometimes a hissing noise of fullness in my ear.  If I walk downstairs or at an angle, feels like I will fall over.  Every once in a while, get spinning. Off and on, been going on for a little while 6 months.  Have had it in the past, but it went away on its own.Denies fall  or head injury, no viral illness, occasional headaches does not classify as migraines.    Non-Vestibular symptoms: changes in hearing, neck pain, headaches, and hissing in L ear, occasional nausea  Type of dizziness: Imbalance (Disequilibrium), Spinning/Vertigo, and Unsteady with head/body turns  Frequency: 3-4x/wk  Duration: seconds  Aggravating factors: Induced by position change: lying supine and supine to sit and Induced by motion: occur when walking, bending down to the ground, and stairs, inclines  Relieving factors: slow movements  Progression of symptoms: better/varies  OCULOMOTOR EXAM:Wears progressive lenses (just got new ones)  Ocular Alignment: normal  Ocular ROM: No Limitations  Spontaneous Nystagmus: absent  Gaze-Induced Nystagmus: absent  Smooth Pursuits: intact  Saccades: intact  Convergence/Divergence: 3 cm    VESTIBULAR - OCULAR REFLEX:   Slow VOR: Comment: horizontal rates as 4/10 symptoms; tightness in neck with vertical; mild dizziness vertical 4/10  VOR Cancellation: Comment: 2/10 with horizontal  Head-Impulse Test: HIT Left: doesn't appear positive; but pt reports improved symptoms; negative R  Dynamic Visual Acuity: Not able to be assessed **Pt takes phone call from cardiologist office during evaluation, so time limited for fully vestibular assessment with positional testing POSITIONAL TESTING: Other: NT due to time constraints  MOTION SENSITIVITY: NOT TESTED AT EVAL  Motion Sensitivity Quotient Intensity: 0 = none, 1 = Lightheaded, 2 = Mild, 3 = Moderate, 4 = Severe, 5 = Vomiting  Intensity  1. Sitting to supine   2. Supine to L side   3. Supine to R side   4. Supine to sitting   5. L Hallpike-Dix   6. Up from L    7. R Hallpike-Dix   8. Up from R    9. Sitting, head tipped to L knee   10. Head up from L knee   11. Sitting, head tipped to R knee   12. Head up from R knee   13. Sitting head turns x5   14.Sitting head nods x5   15. In stance, 180  turn to L    16. In stance, 180 turn to R       M-CTSIB  Condition 1: Firm Surface, EO 30 Sec, Normal Sway  Condition 2: Firm Surface, EC 15.29 Sec, Moderate Sway  Condition 3: Foam Surface, EO 30 Sec, Mild Sway  Condition 4: Foam Surface, EC 6 Sec, Moderate Sway    Pt reports Conditions 2 and 4 are very much like his symptoms of imbalance/fear of falling                                                                                                                           TREATMENT DATE: 05/11/2024     PATIENT EDUCATION: Education details: Eval results, POC, rationale for vestibular rehab and looking further at neck ROM and tightness playing a role in dizziness Person educated: Patient Education method: Explanation Education comprehension: verbalized understanding  HOME EXERCISE PROGRAM:  GOALS: Goals reviewed with patient? Yes  SHORT TERM GOALS: Target date: 05/22/2024  Pt will be independent with HEP for improved dizziness, balance. Baseline: Goal status: MET    LONG TERM GOALS: Target date: 06/26/2024  Pt will be independent  with HEP for improved dizziness, balance, neck flexibility/pain. Baseline:  Goal status: INITIAL  2.  Neck flexion and extension to improve by at least 10 degrees for improved neck motion/decreased pain and dizziness. Baseline:  Goal status: INITIAL  3.  DHI score to improve to less than or equal to 20 for improved dizziness impacting daily activities. Baseline:  Goal status: INITIAL  4.  MCTSIB Condition 2 and 4 to improve to 30 seconds mod or better sway, for improved balance. Baseline:  Goal status: INITIAL    ASSESSMENT:  CLINICAL IMPRESSION: Patient arrived to session with report of some return of dizziness since last session and reports imbalance on stairs. Educated patient on safe stair sequencing with L LE leading d/t c/o R knee pain, however patient with marked muscle weakness as evidenced by excessive forward trunk lean  and use of UEs when ascending. Also with evident lack of control when descending to chair. Updated HEP with quad strengthening as this is contributing to pt's feelings of imbalance. Patient reported understanding and without complaints upon leaving.   OBJECTIVE IMPAIRMENTS: Abnormal gait, decreased balance, difficulty walking, decreased ROM, dizziness, impaired flexibility, and pain.   ACTIVITY LIMITATIONS: standing, squatting, stairs, transfers, reach over head, and locomotion level  PARTICIPATION LIMITATIONS: meal prep, cleaning, laundry, and community activity  PERSONAL FACTORS: 3+ comorbidities: see PMH are also affecting patient's functional outcome.   REHAB POTENTIAL: Good  CLINICAL DECISION MAKING: Stable/uncomplicated  EVALUATION COMPLEXITY: Low   PLAN:  PT FREQUENCY: 1-2x/week  PT DURATION: 6 weeks plus eval  PLANNED INTERVENTIONS: 97750- Physical Performance Testing, 97110-Therapeutic exercises, 97530- Therapeutic activity, 97112- Neuromuscular re-education, 97535- Self Care, [id number]- Manual therapy, [phone]- Canalith repositioning, Patient/Family education, Balance training, and Vestibular training  PLAN FOR NEXT SESSION: continue quad strengthening; review safe stair sequencing; Review HEP for gaze stabilization, multi-sensory balance and neck flexibility; work on neck stretches (add to HEP), limits of stability; step downs, hills/inclines   Louana Terrilyn Christians, PT, DPT 06/08/24 4:18 PM  [facility] at [facility] [address] [place], [place] [address] Phone # [phone] Fax # [phone]

## 2024-06-07 ENCOUNTER — Other Ambulatory Visit: Payer: Self-pay | Admitting: Cardiology

## 2024-06-07 DIAGNOSIS — R0683 Snoring: Secondary | ICD-10-CM

## 2024-06-07 DIAGNOSIS — I779 Disorder of arteries and arterioles, unspecified: Secondary | ICD-10-CM

## 2024-06-07 DIAGNOSIS — G4733 Obstructive sleep apnea (adult) (pediatric): Secondary | ICD-10-CM

## 2024-06-08 ENCOUNTER — Encounter: Payer: Self-pay | Admitting: Physical Therapy

## 2024-06-08 ENCOUNTER — Ambulatory Visit: Admitting: Physical Therapy

## 2024-06-08 DIAGNOSIS — M6281 Muscle weakness (generalized): Secondary | ICD-10-CM | POA: Diagnosis not present

## 2024-06-08 DIAGNOSIS — R2689 Other abnormalities of gait and mobility: Secondary | ICD-10-CM | POA: Diagnosis not present

## 2024-06-08 DIAGNOSIS — R42 Dizziness and giddiness: Secondary | ICD-10-CM

## 2024-06-08 DIAGNOSIS — R2681 Unsteadiness on feet: Secondary | ICD-10-CM | POA: Diagnosis not present

## 2024-06-08 NOTE — Patient Instructions (Signed)
 To go up stairs or curb: Up with the stronger leg (L)   To go down stairs or curb: Down with the weaker leg (R)

## 2024-06-09 ENCOUNTER — Ambulatory Visit (HOSPITAL_BASED_OUTPATIENT_CLINIC_OR_DEPARTMENT_OTHER): Payer: Self-pay | Admitting: Cardiology

## 2024-06-12 NOTE — Therapy (Signed)
 OUTPATIENT PHYSICAL THERAPY VESTIBULAR TREATMENT NOTE     Patient Name: Joseph Compton MRN: [id number] DOB:Jul 08, 1956, 68 y.o., male Today's Date: 06/15/2024  END OF SESSION:  PT End of Session - 06/15/24 1616     Visit Number 5    Number of Visits 13    Date for Recertification  06/26/24    Authorization Type Medicare/BCBS    PT Start Time 1537    PT Stop Time 1615    PT Time Calculation (min) 38 min    Activity Tolerance Patient tolerated treatment well;Patient limited by pain    Behavior During Therapy [facility] for tasks assessed/performed             Past Medical History:  Diagnosis Date   Abdominal aortic aneurysm (AAA) 3.0 cm to 5.5 cm in diameter in male 03/07/2022   PER PATIENT   Arthritis    Chest pain on exertion    Chronic kidney disease, stage 3a (HCC)    GERD (gastroesophageal reflux disease)    History of Helicobacter pylori infection    in [place]    History of transient ischemic attack (TIA)    per patient   Hyperlipemia    Hypertension    Hypertensive kidney disease with CKD (chronic kidney disease) stage I    Impaired fasting glucose    Obesity    Radiculopathy, lumbar region    Past Surgical History:  Procedure Laterality Date   MOUTH SURGERY  2000   Patient Active Problem List   Diagnosis Date Noted   Dyslipidemia, goal LDL below 70 07/27/2022   Lumbar radiculopathy 04/05/2021   Pain in left foot 09/17/2019   Degeneration of lumbar intervertebral disc 11/17/2018   Epidural lipomatosis 11/17/2018   Lumbar disc herniation with radiculopathy 11/06/2018    PCP: Shepard Ade, MD  REFERRING PROVIDER: Verta Izetta HERO, NP   REFERRING DIAG:  8147551706 (ICD-10-CM) - Benign paroxysmal vertigo, bilateral  R42 (ICD-10-CM) - Dizziness and giddiness  M79.602 (ICD-10-CM) - Pain in left arm  M54.12 (ICD-10-CM) - Radiculopathy, cervical region    THERAPY DIAG:  Dizziness and giddiness  Unsteadiness on feet  Other abnormalities of gait and  mobility  Muscle weakness (generalized)  ONSET DATE: 04/27/2024  Rationale for Evaluation and Treatment: Rehabilitation  SUBJECTIVE:   SUBJECTIVE STATEMENT: Reports that his back is hurting when performing STS. Back pain has been an ongoing problem.   Pt accompanied by: self  PERTINENT HISTORY: arthritis R Knee> L knee, lumbar radiculopathy, lumbar disc degeneration  PAIN:  Are you having pain? Yes: NPRS scale: 3-4/10 Pain location: back, R knee  Pain description: soreness Aggravating factors: repetitive motion of forward bending Relieving factors: sometimes heating pad  PRECAUTIONS: Fall and Other: dizziness sometimes with exercise on recumbent bike  RED FLAGS: None   WEIGHT BEARING RESTRICTIONS: No  FALLS: Has patient fallen in last 6 months? No  LIVING ENVIRONMENT: Lives with: lives with their spouse Lives in: House/apartment Stairs: 1 step into home Has following equipment at home: Single point cane  PLOF: Independent and Vocation/Vocational requirements: works part-time as location manager Enjoys walking in neighborhood  PATIENT GOALS: Improve balance and inner ear/neck  OBJECTIVE:    TODAY'S TREATMENT: 06/15/24 Activity Comments  Nustep L5 x 6 min UEs/LEs Dynamic warm up   Review HEP update: Hooklying SLR 3-5x each side STS without UEs sitting on airex pad  sitting LAQ TB Edu on proper form and avoiding R quad lag with SLR. Discontinued LAW d/t c/o R knee pain. Pt was able  to perform STS from elevated seat without pain today.   Standing R knee TKE with green TB 10x3 C/o crepitus but able to tolerance             PATIENT EDUCATION: Education details: discussed modifying HEP to avoid LB and knee pain; HEP update with modifications to avoid pain; discussed f/u with his Orthopedist to address R knee pain to allow pt to fully participate in PT for strengthening  Person educated: Patient Education method: Explanation, Demonstration, Tactile cues, Verbal  cues, and Handouts Education comprehension: verbalized understanding and returned demonstration    HOME EXERCISE PROGRAM Access Code: [email or web address] URL: [email or web address] Date: 06/15/2024 Prepared by: [facility] - Outpatient  Rehab - [facility]  Exercises - Corner Balance Feet Together With Eyes Closed  - 1 x daily - 7 x weekly - 1 sets - 3 reps - 30 sec hold - Seated Gaze Stabilization with Head Rotation  - 1 x daily - 7 x weekly - 1 sets - 3 reps - 15-30 sec hold - Cervical Extension AROM with Strap  - 7 x weekly - 1 sets - 10 reps - 3-5 sec hold - Upper Cervical Extension SNAG with Strap  - 1 x daily - 7 x weekly - 1 sets - 10 reps - 3-5 sec hold - Corner Balance Feet Together: Eyes Open With Head Turns  - 1 x daily - 7 x weekly - 3 sets - 30 sec hold - Corner Balance Feet Together: Eyes Closed With Head Turns  - 1 x daily - 7 x weekly - 3 sets - 30 sec hold - Supine Active Straight Leg Raise  - 1 x daily - 5 x weekly - 2 sets - 10 reps - Sit to Stand with Arms Crossed  - 1 x daily - 5 x weekly - 2 sets - 10 reps - Sitting Knee Extension with Resistance  - 1 x daily - 5 x weekly - 2 sets - 10 reps - Standing Terminal Knee Extension with Resistance  - 1 x daily - 5 x weekly - 2 sets - 10 reps      ---------------------------------------------- Note: Objective measures were completed at Evaluation unless otherwise noted.  DIAGNOSTIC FINDINGS: NA for this episode  COGNITION: Overall cognitive status: Within functional limits for tasks assessed   POSTURE:  rounded shoulders, forward head, and valgus stance at knees  Cervical ROM:    Active A/PROM (deg) eval  Flexion 15 mild pain  Extension 15 mild pain  Right lateral flexion   Left lateral flexion   Right rotation 50  Left rotation 50  (Blank rows = not tested)  BED MOBILITY:  NT  TRANSFERS: Assistive device utilized: None  Sit to stand: Modified independence Stand to sit: Modified  independence  GAIT: Gait pattern: step through pattern, wide BOS, valgus position at L knee Distance walked: clinic distances/gait not fully assessed due to time constraints Assistive device utilized: None Level of assistance: Modified independence  PATIENT SURVEYS:  DHI: THE DIZZINESS HANDICAP INVENTORY (DHI)  P1. Does looking up increase your problem? 2 = Sometimes  E2. Because of your problem, do you feel frustrated? 2 = Sometimes  F3. Because of your problem, do you restrict your travel for business or recreation?  2 = Sometimes  P4. Does walking down the aisle of a supermarket increase your problems?  2 = Sometimes  F5. Because of your problem, do you have difficulty getting into or out of bed?  2 =  Sometimes  F6. Does your problem significantly restrict your participation in social activities, such as going out to dinner, going to the movies, dancing, or going to parties? 2 = Sometimes  F7. Because of your problem, do you have difficulty reading?  2 = Sometimes  P8. Does performing more ambitious activities such as sports, dancing, household chores (sweeping or putting dishes away) increase your problems?  2 = Sometimes  E9. Because of your problem, are you afraid to leave your home without having without having someone accompany you?  0 = No  E10. Because of your problem have you been embarrassed in front of others?  0 = No  P11. Do quick movements of your head increase your problem?  2 = Sometimes  F12. Because of your problem, do you avoid heights?  4 = Yes  P13. Does turning over in bed increase your problem?  2 = Sometimes  F14. Because of your problem, is it difficult for you to do strenuous homework or yard work? 2 = Sometimes  E15. Because of your problem, are you afraid people may think you are intoxicated? 0 = No  F16. Because of your problem, is it difficult for you to go for a walk by yourself?  2 = Sometimes  P17. Does walking down a sidewalk increase your problem?  2 =  Sometimes  E18.Because of your problem, is it difficult for you to concentrate 2 = Sometimes  F19. Because of your problem, is it difficult for you to walk around your house in the dark? 2 = Sometimes  E20. Because of your problem, are you afraid to stay home alone?  0 = No  E21. Because of your problem, do you feel handicapped? 0 = No  E22. Has the problem placed stress on your relationships with members of your family or friends? 0 = No  E23. Because of your problem, are you depressed?  0 = No  F24. Does your problem interfere with your job or household responsibilities?  2 = Sometimes  P25. Does bending over increase your problem?  2 = Sometimes  TOTAL 38    DHI Scoring Instructions  The patient is asked to answer each question as it pertains to dizziness or unsteadiness problems, specifically  considering their condition during the last month. Questions are designed to incorporate functional (F), physical  (P), and emotional (E) impacts on disability.   Scores greater than 10 points should be referred to balance specialists for further evaluation.   16-34 Points (mild handicap)  36-52 Points (moderate handicap)  54+ Points (severe handicap)  Minimally Detectable Change: 17 points ([address] [place], 1990)  [place], G. SHAUNNA. and [place], C. W. (1990). The development of the Dizziness Handicap Inventory. Archives of Otolaryngology - Head and Neck Surgery 116(4): [id number].   VESTIBULAR ASSESSMENT:  GENERAL OBSERVATION: No acute distress   SYMPTOM BEHAVIOR:  Subjective history: Every once in a while, I will get dizzy; not sure if it is neck or medication related.  Sometimes a hissing noise of fullness in my ear.  If I walk downstairs or at an angle, feels like I will fall over.  Every once in a while, get spinning. Off and on, been going on for a little while 6 months.  Have had it in the past, but it went away on its own.Denies fall or head injury, no viral illness, occasional  headaches does not classify as migraines.    Non-Vestibular symptoms: changes in hearing, neck pain, headaches, and  hissing in L ear, occasional nausea  Type of dizziness: Imbalance (Disequilibrium), Spinning/Vertigo, and Unsteady with head/body turns  Frequency: 3-4x/wk  Duration: seconds  Aggravating factors: Induced by position change: lying supine and supine to sit and Induced by motion: occur when walking, bending down to the ground, and stairs, inclines  Relieving factors: slow movements  Progression of symptoms: better/varies  OCULOMOTOR EXAM:Wears progressive lenses (just got new ones)  Ocular Alignment: normal  Ocular ROM: No Limitations  Spontaneous Nystagmus: absent  Gaze-Induced Nystagmus: absent  Smooth Pursuits: intact  Saccades: intact  Convergence/Divergence: 3 cm    VESTIBULAR - OCULAR REFLEX:   Slow VOR: Comment: horizontal rates as 4/10 symptoms; tightness in neck with vertical; mild dizziness vertical 4/10  VOR Cancellation: Comment: 2/10 with horizontal  Head-Impulse Test: HIT Left: doesn't appear positive; but pt reports improved symptoms; negative R  Dynamic Visual Acuity: Not able to be assessed **Pt takes phone call from cardiologist office during evaluation, so time limited for fully vestibular assessment with positional testing POSITIONAL TESTING: Other: NT due to time constraints  MOTION SENSITIVITY: NOT TESTED AT EVAL  Motion Sensitivity Quotient Intensity: 0 = none, 1 = Lightheaded, 2 = Mild, 3 = Moderate, 4 = Severe, 5 = Vomiting  Intensity  1. Sitting to supine   2. Supine to L side   3. Supine to R side   4. Supine to sitting   5. L Hallpike-Dix   6. Up from L    7. R Hallpike-Dix   8. Up from R    9. Sitting, head tipped to L knee   10. Head up from L knee   11. Sitting, head tipped to R knee   12. Head up from R knee   13. Sitting head turns x5   14.Sitting head nods x5   15. In stance, 180 turn to L    16. In stance, 180 turn to R        M-CTSIB  Condition 1: Firm Surface, EO 30 Sec, Normal Sway  Condition 2: Firm Surface, EC 15.29 Sec, Moderate Sway  Condition 3: Foam Surface, EO 30 Sec, Mild Sway  Condition 4: Foam Surface, EC 6 Sec, Moderate Sway    Pt reports Conditions 2 and 4 are very much like his symptoms of imbalance/fear of falling                                                                                                                           TREATMENT DATE: 05/11/2024     PATIENT EDUCATION: Education details: Eval results, POC, rationale for vestibular rehab and looking further at neck ROM and tightness playing a role in dizziness Person educated: Patient Education method: Explanation Education comprehension: verbalized understanding  HOME EXERCISE PROGRAM:  GOALS: Goals reviewed with patient? Yes  SHORT TERM GOALS: Target date: 05/22/2024  Pt will be independent with HEP for improved dizziness, balance. Baseline: Goal status: MET    LONG TERM GOALS: Target  date: 06/26/2024  Pt will be independent with HEP for improved dizziness, balance, neck flexibility/pain. Baseline:  Goal status: INITIAL  2.  Neck flexion and extension to improve by at least 10 degrees for improved neck motion/decreased pain and dizziness. Baseline:  Goal status: INITIAL  3.  DHI score to improve to less than or equal to 20 for improved dizziness impacting daily activities. Baseline:  Goal status: INITIAL  4.  MCTSIB Condition 2 and 4 to improve to 30 seconds mod or better sway, for improved balance. Baseline:  Goal status: INITIAL    ASSESSMENT:  CLINICAL IMPRESSION: Patient arrived to session with report of some LBP with HEP from last session. Session focused on review of HEP updates from last session- with modifications, patient was able to perform but still with remaining discomfort in the knee. Instructed pt to discontinue any exercises that exacerbate knee pain and discussed f/u with his  Orthopedist to address R knee pain to allow pt to fully participate in PT for strengthening. Patient in agreement with this plan.   OBJECTIVE IMPAIRMENTS: Abnormal gait, decreased balance, difficulty walking, decreased ROM, dizziness, impaired flexibility, and pain.   ACTIVITY LIMITATIONS: standing, squatting, stairs, transfers, reach over head, and locomotion level  PARTICIPATION LIMITATIONS: meal prep, cleaning, laundry, and community activity  PERSONAL FACTORS: 3+ comorbidities: see PMH are also affecting patient's functional outcome.   REHAB POTENTIAL: Good  CLINICAL DECISION MAKING: Stable/uncomplicated  EVALUATION COMPLEXITY: Low   PLAN:  PT FREQUENCY: 1-2x/week  PT DURATION: 6 weeks plus eval  PLANNED INTERVENTIONS: 97750- Physical Performance Testing, 97110-Therapeutic exercises, 97530- Therapeutic activity, 97112- Neuromuscular re-education, 97535- Self Care, 02859- Manual therapy, [phone]- Canalith repositioning, Patient/Family education, Balance training, and Vestibular training  PLAN FOR NEXT SESSION: focus on balance, dizziness, neck pain rather than quad strengthening d/t R knee pain;review safe stair sequencing; Review HEP for gaze stabilization, multi-sensory balance and neck flexibility; work on neck stretches (add to HEP), limits of stability; step downs, hills/inclines   Louana Terrilyn Christians, PT, DPT 06/15/24 4:19 PM  [facility] [address] [place], [place] [address] Phone # [phone] Fax # [phone]

## 2024-06-15 ENCOUNTER — Ambulatory Visit: Attending: Registered Nurse | Admitting: Physical Therapy

## 2024-06-15 DIAGNOSIS — R2689 Other abnormalities of gait and mobility: Secondary | ICD-10-CM | POA: Diagnosis not present

## 2024-06-15 DIAGNOSIS — R42 Dizziness and giddiness: Secondary | ICD-10-CM | POA: Insufficient documentation

## 2024-06-15 DIAGNOSIS — M6281 Muscle weakness (generalized): Secondary | ICD-10-CM | POA: Diagnosis not present

## 2024-06-15 DIAGNOSIS — R2681 Unsteadiness on feet: Secondary | ICD-10-CM | POA: Insufficient documentation

## 2024-06-19 NOTE — Therapy (Signed)
 OUTPATIENT PHYSICAL THERAPY VESTIBULAR DISCHARGE     Patient Name: Joseph Compton MRN: [id number] DOB:[birth year], 68 y.o., male Today's Date: 06/22/2024   Progress Note Reporting Period 05/11/24 to 06/22/24  See note below for Objective Data and Assessment of Progress/Goals.    END OF SESSION:  PT End of Session - 06/22/24 1608     Visit Number 6    Number of Visits 13    Date for Recertification  06/26/24    Authorization Type Medicare/BCBS    PT Start Time 1533    PT Stop Time 1609    PT Time Calculation (min) 36 min    Equipment Utilized During Treatment Gait belt    Activity Tolerance Patient tolerated treatment well;Patient limited by pain    Behavior During Therapy WFL for tasks assessed/performed              Past Medical History:  Diagnosis Date   Abdominal aortic aneurysm (AAA) 3.0 cm to 5.5 cm in diameter in male 03/07/2022   PER PATIENT   Arthritis    Chest pain on exertion    Chronic kidney disease, stage 3a (HCC)    GERD (gastroesophageal reflux disease)    History of Helicobacter pylori infection    in [place]    History of transient ischemic attack (TIA)    per patient   Hyperlipemia    Hypertension    Hypertensive kidney disease with CKD (chronic kidney disease) stage I    Impaired fasting glucose    Obesity    Radiculopathy, lumbar region    Past Surgical History:  Procedure Laterality Date   MOUTH SURGERY  2000   Patient Active Problem List   Diagnosis Date Noted   Dyslipidemia, goal LDL below 70 07/27/2022   Lumbar radiculopathy 04/05/2021   Pain in left foot 09/17/2019   Degeneration of lumbar intervertebral disc 11/17/2018   Epidural lipomatosis 11/17/2018   Lumbar disc herniation with radiculopathy 11/06/2018    PCP: Shepard Ade, MD  REFERRING PROVIDER: Verta Izetta HERO, NP   REFERRING DIAG:  [phone] (ICD-10-CM) - Benign paroxysmal vertigo, bilateral  R42 (ICD-10-CM) - Dizziness and giddiness  M79.602 (ICD-10-CM)  - Pain in left arm  M54.12 (ICD-10-CM) - Radiculopathy, cervical region    THERAPY DIAG:  Dizziness and giddiness  Unsteadiness on feet  Other abnormalities of gait and mobility  Muscle weakness (generalized)  ONSET DATE: 04/27/2024  Rationale for Evaluation and Treatment: Rehabilitation  SUBJECTIVE:   SUBJECTIVE STATEMENT: A little better, not much but I'm getting there. Haven't called the Ortho doc yet.   Pt accompanied by: self  PERTINENT HISTORY: arthritis R Knee> L knee, lumbar radiculopathy, lumbar disc degeneration  PAIN:  Are you having pain? Yes: NPRS scale: 2/10 Pain location: back, R knee  Pain description: soreness Aggravating factors: repetitive motion of forward bending Relieving factors: sometimes heating pad  PRECAUTIONS: Fall and Other: dizziness sometimes with exercise on recumbent bike  RED FLAGS: None   WEIGHT BEARING RESTRICTIONS: No  FALLS: Has patient fallen in last 6 months? No  LIVING ENVIRONMENT: Lives with: lives with their spouse Lives in: House/apartment Stairs: 1 step into home Has following equipment at home: Single point cane  PLOF: Independent and Vocation/Vocational requirements: works part-time as location manager Enjoys walking in neighborhood  PATIENT GOALS: Improve balance and inner ear/neck  OBJECTIVE:     TODAY'S TREATMENT: 06/22/24    Activity Comments  DHI  24/100 Still rates sometimes on dizziness with rolling, head nods, quick head movements, bending  Sitting horizontal gaze stabilization  Not that much dizziness  Sitting head turns to targets  No dizziness              Cervical ROM:    Active A/PROM (deg) eval AROM 06/22/24  Flexion 15 mild pain 34  Extension 15 mild pain 26  Right lateral flexion  10  Left lateral flexion  15  Right rotation 50 44  Left rotation 50 40  (Blank rows = not tested)    M-CTSIB  Condition 1: Firm Surface, EO 30 Sec, Normal Sway  Condition 2: Firm Surface,  EC 30 Sec, Mild Sway  Condition 3: Foam Surface, EO 30 Sec, Mild Sway  Condition 4: Foam Surface, EC 30 Sec, Mild and Moderate Sway    PATIENT EDUCATION: Education details: edu on progress towards goals and remaining impairments, review of HEP; encouraged pt to speak to his PCP about other causes of dizziness as pt's dizziness does not have a clear pattern- for example pt reports dizziness every time he walks into his kitchen  Person educated: Patient Education method: Explanation Education comprehension: verbalized understanding     HOME EXERCISE PROGRAM Access Code: [email or web address] URL: [email or web address] Date: 06/22/2024 Prepared by: [facility] - Outpatient  Rehab - [facility]  Exercises - Corner Balance Feet Together With Eyes Closed  - 1 x daily - 7 x weekly - 1 sets - 3 reps - 30 sec hold - Cervical Extension AROM with Strap  - 7 x weekly - 1 sets - 10 reps - 3-5 sec hold - Upper Cervical Extension SNAG with Strap  - 1 x daily - 7 x weekly - 1 sets - 10 reps - 3-5 sec hold - Corner Balance Feet Together: Eyes Open With Head Turns  - 1 x daily - 7 x weekly - 3 sets - 30 sec hold - Corner Balance Feet Together: Eyes Closed With Head Turns  - 1 x daily - 7 x weekly - 3 sets - 30 sec hold - Supine Active Straight Leg Raise  - 1 x daily - 5 x weekly - 2 sets - 10 reps - Sit to Stand with Arms Crossed  - 1 x daily - 5 x weekly - 2 sets - 10 reps - Sitting Knee Extension with Resistance  - 1 x daily - 5 x weekly - 2 sets - 10 reps - Standing Terminal Knee Extension with Resistance  - 1 x daily - 5 x weekly - 2 sets - 10 reps   ---------------------------------------------- Note: Objective measures were completed at Evaluation unless otherwise noted.  DIAGNOSTIC FINDINGS: NA for this episode  COGNITION: Overall cognitive status: Within functional limits for tasks assessed   POSTURE:  rounded shoulders, forward head, and valgus stance at knees  Cervical ROM:     Active A/PROM (deg) eval  Flexion 15 mild pain  Extension 15 mild pain  Right lateral flexion   Left lateral flexion   Right rotation 50  Left rotation 50  (Blank rows = not tested)  BED MOBILITY:  NT  TRANSFERS: Assistive device utilized: None  Sit to stand: Modified independence Stand to sit: Modified independence  GAIT: Gait pattern: step through pattern, wide BOS, valgus position at L knee Distance walked: clinic distances/gait not fully assessed due to time constraints Assistive device utilized: None Level of assistance: Modified independence  PATIENT SURVEYS:  DHI: THE DIZZINESS HANDICAP INVENTORY (DHI)  P1. Does looking up increase your problem? 2 = Sometimes  E2. Because of  your problem, do you feel frustrated? 2 = Sometimes  F3. Because of your problem, do you restrict your travel for business or recreation?  2 = Sometimes  P4. Does walking down the aisle of a supermarket increase your problems?  2 = Sometimes  F5. Because of your problem, do you have difficulty getting into or out of bed?  2 = Sometimes  F6. Does your problem significantly restrict your participation in social activities, such as going out to dinner, going to the movies, dancing, or going to parties? 2 = Sometimes  F7. Because of your problem, do you have difficulty reading?  2 = Sometimes  P8. Does performing more ambitious activities such as sports, dancing, household chores (sweeping or putting dishes away) increase your problems?  2 = Sometimes  E9. Because of your problem, are you afraid to leave your home without having without having someone accompany you?  0 = No  E10. Because of your problem have you been embarrassed in front of others?  0 = No  P11. Do quick movements of your head increase your problem?  2 = Sometimes  F12. Because of your problem, do you avoid heights?  4 = Yes  P13. Does turning over in bed increase your problem?  2 = Sometimes  F14. Because of your problem, is it  difficult for you to do strenuous homework or yard work? 2 = Sometimes  E15. Because of your problem, are you afraid people may think you are intoxicated? 0 = No  F16. Because of your problem, is it difficult for you to go for a walk by yourself?  2 = Sometimes  P17. Does walking down a sidewalk increase your problem?  2 = Sometimes  E18.Because of your problem, is it difficult for you to concentrate 2 = Sometimes  F19. Because of your problem, is it difficult for you to walk around your house in the dark? 2 = Sometimes  E20. Because of your problem, are you afraid to stay home alone?  0 = No  E21. Because of your problem, do you feel handicapped? 0 = No  E22. Has the problem placed stress on your relationships with members of your family or friends? 0 = No  E23. Because of your problem, are you depressed?  0 = No  F24. Does your problem interfere with your job or household responsibilities?  2 = Sometimes  P25. Does bending over increase your problem?  2 = Sometimes  TOTAL 38    DHI Scoring Instructions  The patient is asked to answer each question as it pertains to dizziness or unsteadiness problems, specifically  considering their condition during the last month. Questions are designed to incorporate functional (F), physical  (P), and emotional (E) impacts on disability.   Scores greater than 10 points should be referred to balance specialists for further evaluation.   16-34 Points (mild handicap)  36-52 Points (moderate handicap)  54+ Points (severe handicap)  Minimally Detectable Change: 17 points ([address] [place], 1990)  Rotonda, G. SHAUNNA. and Delton, C. W. (1990). The development of the Dizziness Handicap Inventory. Archives of Otolaryngology - Head and Neck Surgery 116(4): [id number].   VESTIBULAR ASSESSMENT:  GENERAL OBSERVATION: No acute distress   SYMPTOM BEHAVIOR:  Subjective history: Every once in a while, I will get dizzy; not sure if it is neck or medication related.   Sometimes a hissing noise of fullness in my ear.  If I walk downstairs or at an angle, feels like  I will fall over.  Every once in a while, get spinning. Off and on, been going on for a little while 6 months.  Have had it in the past, but it went away on its own.Denies fall or head injury, no viral illness, occasional headaches does not classify as migraines.    Non-Vestibular symptoms: changes in hearing, neck pain, headaches, and hissing in L ear, occasional nausea  Type of dizziness: Imbalance (Disequilibrium), Spinning/Vertigo, and Unsteady with head/body turns  Frequency: 3-4x/wk  Duration: seconds  Aggravating factors: Induced by position change: lying supine and supine to sit and Induced by motion: occur when walking, bending down to the ground, and stairs, inclines  Relieving factors: slow movements  Progression of symptoms: better/varies  OCULOMOTOR EXAM:Wears progressive lenses (just got new ones)  Ocular Alignment: normal  Ocular ROM: No Limitations  Spontaneous Nystagmus: absent  Gaze-Induced Nystagmus: absent  Smooth Pursuits: intact  Saccades: intact  Convergence/Divergence: 3 cm    VESTIBULAR - OCULAR REFLEX:   Slow VOR: Comment: horizontal rates as 4/10 symptoms; tightness in neck with vertical; mild dizziness vertical 4/10  VOR Cancellation: Comment: 2/10 with horizontal  Head-Impulse Test: HIT Left: doesn't appear positive; but pt reports improved symptoms; negative R  Dynamic Visual Acuity: Not able to be assessed **Pt takes phone call from cardiologist office during evaluation, so time limited for fully vestibular assessment with positional testing POSITIONAL TESTING: Other: NT due to time constraints  MOTION SENSITIVITY: NOT TESTED AT EVAL  Motion Sensitivity Quotient Intensity: 0 = none, 1 = Lightheaded, 2 = Mild, 3 = Moderate, 4 = Severe, 5 = Vomiting  Intensity  1. Sitting to supine   2. Supine to L side   3. Supine to R side   4. Supine to sitting   5. L  Hallpike-Dix   6. Up from L    7. R Hallpike-Dix   8. Up from R    9. Sitting, head tipped to L knee   10. Head up from L knee   11. Sitting, head tipped to R knee   12. Head up from R knee   13. Sitting head turns x5   14.Sitting head nods x5   15. In stance, 180 turn to L    16. In stance, 180 turn to R       M-CTSIB  Condition 1: Firm Surface, EO 30 Sec, Normal Sway  Condition 2: Firm Surface, EC 15.29 Sec, Moderate Sway  Condition 3: Foam Surface, EO 30 Sec, Mild Sway  Condition 4: Foam Surface, EC 6 Sec, Moderate Sway    Pt reports Conditions 2 and 4 are very much like his symptoms of imbalance/fear of falling                                                                                                                           TREATMENT DATE: 05/11/2024     PATIENT EDUCATION: Education details: Eval results, POC, rationale for vestibular  rehab and looking further at neck ROM and tightness playing a role in dizziness Person educated: Patient Education method: Explanation Education comprehension: verbalized understanding  HOME EXERCISE PROGRAM:  GOALS: Goals reviewed with patient? Yes  SHORT TERM GOALS: Target date: 05/22/2024  Pt will be independent with HEP for improved dizziness, balance. Baseline: Goal status: MET    LONG TERM GOALS: Target date: 06/26/2024  Pt will be independent with HEP for improved dizziness, balance, neck flexibility/pain. Baseline:  Goal status: MET 06/22/24  2.  Neck flexion and extension to improve by at least 10 degrees for improved neck motion/decreased pain and dizziness. Baseline: 19 degrees improvement flexion, 11 deg improvement extension 06/22/24 Goal status: MET 06/22/24  3.  DHI score to improve to less than or equal to 20 for improved dizziness impacting daily activities. Baseline: 38>  24 06/23/23 Goal status: NOT MET 06/23/23  4.  MCTSIB Condition 2 and 4 to improve to 30 seconds mod or better sway, for  improved balance. Baseline: mild sway/mild-moderate sway respectively 06/22/24 Goal status: MET 06/22/24    ASSESSMENT:  CLINICAL IMPRESSION: Patient arrived to session without new complaints. Cervical AROM improved in flexion and extension and this goal has improved. Multisensory balance testing revealed improvement in time and sway in holding EC positions. DHI score improved to 24/100, indicating an improvement in effects of dizziness on daily activities. As patient's symptoms do not follow a clear pattern, dizziness is likely not vestibular and nature and recommend additional workup. Patient in agreement with this plan. Now ready for DC.   OBJECTIVE IMPAIRMENTS: Abnormal gait, decreased balance, difficulty walking, decreased ROM, dizziness, impaired flexibility, and pain.   ACTIVITY LIMITATIONS: standing, squatting, stairs, transfers, reach over head, and locomotion level  PARTICIPATION LIMITATIONS: meal prep, cleaning, laundry, and community activity  PERSONAL FACTORS: 3+ comorbidities: see PMH are also affecting patient's functional outcome.   REHAB POTENTIAL: Good  CLINICAL DECISION MAKING: Stable/uncomplicated  EVALUATION COMPLEXITY: Low   PLAN:  PT FREQUENCY: 1-2x/week  PT DURATION: 6 weeks plus eval  PLANNED INTERVENTIONS: 97750- Physical Performance Testing, 97110-Therapeutic exercises, 97530- Therapeutic activity, 97112- Neuromuscular re-education, 97535- Self Care, 02859- Manual therapy, [phone]- Canalith repositioning, Patient/Family education, Balance training, and Vestibular training  PLAN FOR NEXT SESSION: DC at this time   PHYSICAL THERAPY DISCHARGE SUMMARY  Visits from Start of Care: 6  Current functional level related to goals / functional outcomes: See above clinical impression   Remaining deficits: dizziness   Education / Equipment: HEP  Plan: Patient agrees to discharge.  Patient goals were partially met. Patient is being discharged due to meeting  max therapeutic benefit.        Louana Terrilyn Christians, PT, DPT 06/22/24 4:10 PM  [facility] at [facility] [address] [place], [address] [place], [place] [address] Phone # [phone] Fax # [phone]

## 2024-06-22 ENCOUNTER — Ambulatory Visit: Admitting: Physical Therapy

## 2024-06-22 ENCOUNTER — Encounter: Payer: Self-pay | Admitting: Physical Therapy

## 2024-06-22 DIAGNOSIS — R2689 Other abnormalities of gait and mobility: Secondary | ICD-10-CM

## 2024-06-22 DIAGNOSIS — R42 Dizziness and giddiness: Secondary | ICD-10-CM | POA: Diagnosis not present

## 2024-06-22 DIAGNOSIS — R2681 Unsteadiness on feet: Secondary | ICD-10-CM

## 2024-06-22 DIAGNOSIS — M6281 Muscle weakness (generalized): Secondary | ICD-10-CM

## 2024-06-29 ENCOUNTER — Ambulatory Visit: Admitting: Physical Therapy

## 2024-08-28 ENCOUNTER — Other Ambulatory Visit: Payer: Self-pay | Admitting: Cardiology

## 2024-08-31 NOTE — Telephone Encounter (Signed)
 In accordance with refill protocols, please review and address the following requirements before this medication refill can be authorized:  Labs

## 2024-08-31 NOTE — Telephone Encounter (Signed)
 Cholesterol, total 119.000 m 11/27/2023 HDL 45.000 mg 11/27/2023 LDL 53.000 mg 11/27/2023 Triglycerides 103.000 m 11/27/2023 A1C 5.600 02/26/2024 Hemoglobin 12.000 G/ 05/25/2024 Creatinine, Serum 2.240 MG/ 05/25/2024 Potassium 4.700 mEq 11/27/2023 Magnesium 2.000 MG/ 05/25/2024 ALT (SGPT) 15.000 IU/ 11/27/2023 TSH 3.670 11/27/2023   LABS FROM [facility].

## 2024-11-12 ENCOUNTER — Ambulatory Visit (HOSPITAL_BASED_OUTPATIENT_CLINIC_OR_DEPARTMENT_OTHER): Admitting: Cardiology
